# Patient Record
Sex: Female | Born: 1990 | Race: Black or African American | Hispanic: No | Smoking: Never smoker
Health system: Southern US, Community
[De-identification: ages and names within clinical notes are randomized; demographics above are authoritative.]

## PROBLEM LIST (undated history)

## (undated) ENCOUNTER — Inpatient Hospital Stay: Payer: Self-pay

## (undated) DIAGNOSIS — A6000 Herpesviral infection of urogenital system, unspecified: Secondary | ICD-10-CM

## (undated) DIAGNOSIS — K219 Gastro-esophageal reflux disease without esophagitis: Secondary | ICD-10-CM

## (undated) DIAGNOSIS — E282 Polycystic ovarian syndrome: Secondary | ICD-10-CM

## (undated) DIAGNOSIS — F419 Anxiety disorder, unspecified: Secondary | ICD-10-CM

## (undated) DIAGNOSIS — E669 Obesity, unspecified: Secondary | ICD-10-CM

## (undated) DIAGNOSIS — Z6790 Unspecified blood type, Rh positive: Secondary | ICD-10-CM

## (undated) DIAGNOSIS — O3430 Maternal care for cervical incompetence, unspecified trimester: Secondary | ICD-10-CM

## (undated) HISTORY — DX: Polycystic ovarian syndrome: E28.2

## (undated) HISTORY — DX: Unspecified blood type, Rh positive: Z67.90

## (undated) HISTORY — DX: Obesity, unspecified: E66.9

## (undated) HISTORY — DX: Herpesviral infection of urogenital system, unspecified: A60.00

---
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
## 2012-12-13 LAB — HM PAP SMEAR: HM Pap smear: NEGATIVE

## 2013-10-04 ENCOUNTER — Emergency Department: Payer: Self-pay | Admitting: Emergency Medicine

## 2013-10-04 LAB — CBC
HCT: 34.7 % — ABNORMAL LOW (ref 35.0–47.0)
HGB: 11.9 g/dL — ABNORMAL LOW (ref 12.0–16.0)
MCH: 29.2 pg (ref 26.0–34.0)
MCHC: 34.3 g/dL (ref 32.0–36.0)
MCV: 85 fL (ref 80–100)
Platelet: 285 10*3/uL (ref 150–440)
RBC: 4.08 10*6/uL (ref 3.80–5.20)
RDW: 14.8 % — ABNORMAL HIGH (ref 11.5–14.5)
WBC: 13.5 10*3/uL — ABNORMAL HIGH (ref 3.6–11.0)

## 2013-10-04 LAB — HCG, QUANTITATIVE, PREGNANCY: BETA HCG, QUANT.: 11175 m[IU]/mL — AB

## 2013-10-09 ENCOUNTER — Emergency Department: Payer: Self-pay | Admitting: Emergency Medicine

## 2013-10-09 DIAGNOSIS — O3430 Maternal care for cervical incompetence, unspecified trimester: Secondary | ICD-10-CM

## 2013-10-09 LAB — CBC
HCT: 36 %
HGB: 11.9 g/dL — ABNORMAL LOW
MCH: 28.3 pg
MCHC: 33.1 g/dL
MCV: 85 fL
Platelet: 311 10*3/uL
RBC: 4.22 X10 6/mm 3
RDW: 14.7 % — ABNORMAL HIGH
WBC: 15.6 10*3/uL — ABNORMAL HIGH

## 2013-10-09 LAB — HCG, QUANTITATIVE, PREGNANCY: BETA HCG, QUANT.: 9105 m[IU]/mL — AB

## 2013-10-12 LAB — PATHOLOGY REPORT

## 2013-10-16 ENCOUNTER — Emergency Department: Payer: Self-pay | Admitting: Internal Medicine

## 2013-10-16 LAB — COMPREHENSIVE METABOLIC PANEL
ALK PHOS: 84 U/L
ALT: 46 U/L
AST: 18 U/L (ref 15–37)
Albumin: 3.6 g/dL (ref 3.4–5.0)
Anion Gap: 7 (ref 7–16)
BUN: 10 mg/dL (ref 7–18)
Bilirubin,Total: 0.2 mg/dL (ref 0.2–1.0)
CREATININE: 0.7 mg/dL (ref 0.60–1.30)
Calcium, Total: 9.6 mg/dL (ref 8.5–10.1)
Chloride: 105 mmol/L (ref 98–107)
Co2: 26 mmol/L (ref 21–32)
EGFR (Non-African Amer.): >60
GLUCOSE: 98 mg/dL (ref 65–99)
Osmolality: 275 (ref 275–301)
Potassium: 3.8 mmol/L (ref 3.5–5.1)
Sodium: 138 mmol/L (ref 136–145)
Total Protein: 7.6 g/dL (ref 6.4–8.2)

## 2013-10-16 LAB — CBC WITH DIFFERENTIAL/PLATELET
Basophil #: 0 10*3/uL (ref 0.0–0.1)
Basophil %: 0.4 %
Eosinophil #: 0.1 10*3/uL (ref 0.0–0.7)
Eosinophil %: 1.2 %
HCT: 37.2 % (ref 35.0–47.0)
HGB: 12.4 g/dL (ref 12.0–16.0)
Lymphocyte #: 2.4 10*3/uL (ref 1.0–3.6)
Lymphocyte %: 22.2 %
MCH: 28.7 pg (ref 26.0–34.0)
MCHC: 33.2 g/dL (ref 32.0–36.0)
MCV: 87 fL (ref 80–100)
MONO ABS: 0.6 x10 3/mm (ref 0.2–0.9)
Monocyte %: 5.2 %
NEUTROS ABS: 7.6 10*3/uL — AB (ref 1.4–6.5)
NEUTROS PCT: 71 %
PLATELETS: 424 10*3/uL (ref 150–440)
RBC: 4.31 10*6/uL (ref 3.80–5.20)
RDW: 14.9 % — ABNORMAL HIGH (ref 11.5–14.5)
WBC: 10.7 10*3/uL (ref 3.6–11.0)

## 2013-10-16 LAB — D-DIMER(ARMC): D-Dimer: 516 ng/ml

## 2013-10-26 ENCOUNTER — Encounter: Payer: Self-pay | Admitting: Internal Medicine

## 2013-10-26 ENCOUNTER — Ambulatory Visit (INDEPENDENT_AMBULATORY_CARE_PROVIDER_SITE_OTHER): Payer: Managed Care, Other (non HMO) | Admitting: Internal Medicine

## 2013-10-26 VITALS — BP 116/72 | HR 81 | Temp 98.4°F | Wt 222.0 lb

## 2013-10-26 DIAGNOSIS — R0781 Pleurodynia: Secondary | ICD-10-CM

## 2013-10-26 DIAGNOSIS — R079 Chest pain, unspecified: Secondary | ICD-10-CM

## 2013-10-26 MED ORDER — NAPROXEN 500 MG PO TABS: 500.0000 mg | ORAL_TABLET | Freq: Three times a day (TID) | ORAL | Status: DC

## 2013-10-26 NOTE — Progress Notes (Signed)
Pre visit review using our clinic review tool, if applicable. No additional management support is needed unless otherwise documented below in the visit note. 

## 2013-10-26 NOTE — Progress Notes (Signed)
   Subjective:    Patient ID: Regina Chambers, female    DOB: 05/03/1990, 23 y.o.   MRN: [id number]  HPI  Pt presents to the clinic today with c/o left upper quadrant pain. She reports this started 2 weeks ago. She describes the pain as achy, but can be sharp especially with movement. It also hurts with laughing or coughing. She denies nausea, vomiting or diarrhea. She is having normal BM's daily. She reports that she did have a miscarriage 3 weeks ago at [redacted] weeks along. She reports that she did have this pain prior to her pregnancy for a month or two. She denies any particular injury to the area.   Review of Systems      No past medical history on file.  No current outpatient prescriptions on file.   No current facility-administered medications for this visit.    Allergies not on file  No family history on file.  History   Social History  . Marital Status: Single    Spouse Name: N/A    Number of Children: N/A  . Years of Education: N/A   Occupational History  . Not on file.   Social History Main Topics  . Smoking status: Not on file  . Smokeless tobacco: Not on file  . Alcohol Use: Not on file  . Drug Use: Not on file  . Sexual Activity: Not on file   Other Topics Concern  . Not on file   Social History Narrative  . No narrative on file     Constitutional: Denies fever, malaise, fatigue, headache or abrupt weight changes.   Gastrointestinal: Pt reports left side pain. Denies abdominal pain, bloating, constipation, diarrhea or blood in the stool.  GU: Denies urgency, frequency, pain with urination, burning sensation, blood in urine, odor or discharge.   No other specific complaints in a complete review of systems (except as listed in HPI above).  Objective:   Physical Exam  BP 116/72  Pulse 81  Temp(Src) 98.4 F (36.9 C) (Oral)  Wt 222 lb (100.699 kg)  SpO2 99%  LMP 06/04/2013 Wt Readings from Last 3 Encounters:  10/26/13 222 lb (100.699 kg)     General: Appears her stated age, obese but well developed, well nourished in NAD. Cardiovascular: Normal rate and rhythm. S1,S2 noted.  No murmur, rubs or gallops noted.  Pulmonary/Chest: Normal effort and positive vesicular breath sounds. No respiratory distress. No wheezes, rales or ronchi noted.  Abdomen: Soft and nontender. Normal bowel sounds, no bruits noted. No distention or masses noted. Liver, spleen and kidneys non palpable. MSK: Pain with palpation of the left lower anterior ribs.      Assessment & Plan:  Pain in left rib cage:  Try some naproxen 500 mg TID prn Ice may help as well  If no improvement, we can discuss further at your new patient appointment.

## 2013-11-05 ENCOUNTER — Encounter: Payer: Self-pay | Admitting: Internal Medicine

## 2013-11-05 ENCOUNTER — Telehealth: Payer: Self-pay | Admitting: Internal Medicine

## 2013-11-05 ENCOUNTER — Ambulatory Visit (INDEPENDENT_AMBULATORY_CARE_PROVIDER_SITE_OTHER): Payer: Managed Care, Other (non HMO) | Admitting: Internal Medicine

## 2013-11-05 VITALS — BP 110/70 | HR 85 | Temp 99.0°F | Wt 225.8 lb

## 2013-11-05 DIAGNOSIS — R071 Chest pain on breathing: Secondary | ICD-10-CM

## 2013-11-05 DIAGNOSIS — R197 Diarrhea, unspecified: Secondary | ICD-10-CM

## 2013-11-05 DIAGNOSIS — R0789 Other chest pain: Secondary | ICD-10-CM | POA: Insufficient documentation

## 2013-11-05 NOTE — Telephone Encounter (Signed)
Patient Information:  Caller Name: Kloe  Phone: [phone]  Patient: Regina Chambers, Regina Chambers  Gender: Female  DOB: 15-Sep-1990  Age: 23 Years  PCP: Other  Pregnant: No  Office Follow Up:  Does the office need to follow up with this patient?: No  Instructions For The Office: N/A  RN Note:  Spoke to nurse Tasha at the office and she consulted with Nicki Reaper NP who said that it was ok for pt to be seen at the office this afternoon. Appt scheduled for today(11/05/13) @ 1630(per pt request) with Dr.Letvak.  Symptoms  Reason For Call & Symptoms: Calling about constant left upper abd pain--annoying,nagging pain that hurts worse when laughing or coughing and when moving, diarrhea with mucous, occas nausea. Is at work now and has been performing daily activities. Just had a miscarriage 4 weeks ago. Was seen at office on 10/26/13 for abd pain but sxs have gotten worse. Pt is concerned about possible pancreatitis and wanst an US done. Taking Ibuprofen around the clock with effect for pain. Has had pain for months but now is constant and has gotten much worse and she is very concerned.  Reviewed Health History In EMR: Yes  Reviewed Medications In EMR: Yes  Reviewed Allergies In EMR: Yes  Reviewed Surgeries / Procedures: Yes  Date of Onset of Symptoms: 11/03/2013  Treatments Tried: Ibuprofen  Treatments Tried Worked: Yes OB / GYN:  LMP: 06/04/2013  Guideline(s) Used:  Abdominal Pain - Female  Disposition Per Guideline:   Go to ED Now (or to Office with PCP Approval)  Reason For Disposition Reached:   Constant abdominal pain lasting > 2 hours  Advice Given:  Call Back If:  You become worse.  Patient Will Follow Care Advice:  YES  Appointment Scheduled:  11/05/2013 16:30:00 Appointment Scheduled Provider:  Tillman [facility]ide [facility])

## 2013-11-05 NOTE — Assessment & Plan Note (Signed)
Seems to be in ribs Discussed trying sports bra without underwire for now She is very anxious about this Will check labs Had chest CT at [facility]---will get that result and review also

## 2013-11-05 NOTE — Progress Notes (Signed)
Pre visit review using our clinic review tool, if applicable. No additional management support is needed unless otherwise documented below in the visit note. 

## 2013-11-05 NOTE — Telephone Encounter (Signed)
Pt seen in the office this afternoon 

## 2013-11-05 NOTE — Assessment & Plan Note (Signed)
Doesn't seem to be an illness Discussed that she is describing what could just be a metformin reaction No reason to do any further testing

## 2013-11-05 NOTE — Progress Notes (Signed)
   Subjective:    Patient ID: Regina Chambers, female    DOB: [birth year], 23 y.o.   MRN: [id number]  HPI Having a lot of abdominal pain Recent miscarriage at 17 weeks (about 3 weeks ago)--not sure if it could be related to this Went into preterm labor and delivered the fetus--- placenta also came out okay Has had some vaginal bleeding---lightened up but then a little more after sex  Pain under left ribs--radiates to back Fairly constant Uses ibuprofen ---this helps. But then it returns Nagging, gnawing feeling at times---other times it gets worse with bending or trying to lift things This goes back to April Wondered if it could be from underwire in bra Has adjusted her position at work Did seem to improve during pregnancy  Works in [employer] lab Has diarrhea, mucus in stool and it is floating in bowl May have gone back for a while Loose stool about twice a day No blood in stool Has seen yellow mass separate from her stools Gets some urgency-- no incontinence though  Current Outpatient Prescriptions on File Prior to Visit  Medication Sig Dispense Refill  . folic acid (FOLVITE) 400 MCG tablet Take 400 mcg by mouth daily.      . metFORMIN (GLUCOPHAGE-XR) 750 MG 24 hr tablet Take 750 mg by mouth 2 (two) times daily.      . Multiple Vitamin (MULTIVITAMIN) tablet Take 1 tablet by mouth daily.      . naproxen (NAPROSYN) 500 MG tablet Take 1 tablet (500 mg total) by mouth 3 (three) times daily with meals.  30 tablet  0   No current facility-administered medications on file prior to visit.    No Known Allergies  Past Medical History  Diagnosis Date  . Polycystic disease, ovaries     No past surgical history on file.  No family history on file.  History   Social History  . Marital Status: Single    Spouse Name: N/A    Number of Children: N/A  . Years of Education: N/A   Occupational History  . Not on file.   Social History Main Topics  . Smoking status: Never Smoker    . Smokeless tobacco: Never Used  . Alcohol Use: No  . Drug Use: Not on file  . Sexual Activity: Not on file   Other Topics Concern  . Not on file   Social History Narrative  . No narrative on file   Review of Systems No endometriosis --only PCOS (the reason for the metformin) No injuries Appetite okay--has tried to cut back on food Some cough--vague No SOB No rash    Objective:   Physical Exam  Constitutional: She appears well-developed and well-nourished. No distress.  Neck: Normal range of motion. Neck supple. No thyromegaly present.  Cardiovascular: Normal rate, regular rhythm and normal heart sounds.  Exam reveals no gallop and no friction rub.   No murmur heard. Pulmonary/Chest: Effort normal and breath sounds normal. No respiratory distress. She has no wheezes. She has no rales.  Tender along ribs T9-12 along anterior axillary line on left) No rib abnormalities No dullness in lungs  Abdominal: Soft. She exhibits no distension. There is no tenderness. There is no rebound and no guarding.  Lymphadenopathy:    She has no cervical adenopathy.  Psychiatric:  Anxious about the pain in left side          Assessment & Plan:

## 2013-11-06 LAB — COMPREHENSIVE METABOLIC PANEL
ALBUMIN: 4.1 g/dL (ref 3.5–5.2)
ALT: 23 U/L (ref 0–35)
AST: 22 U/L (ref 0–37)
Alkaline Phosphatase: 61 U/L (ref 39–117)
BUN: 8 mg/dL (ref 6–23)
CO2: 25 meq/L (ref 19–32)
Calcium: 9.3 mg/dL (ref 8.4–10.5)
Chloride: 103 mEq/L (ref 96–112)
Creatinine, Ser: 0.6 mg/dL (ref 0.4–1.2)
GFR: 147.63 mL/min (ref >60.00)
Glucose, Bld: 84 mg/dL (ref 70–99)
POTASSIUM: 3.8 meq/L (ref 3.5–5.1)
SODIUM: 137 meq/L (ref 135–145)
TOTAL PROTEIN: 7.5 g/dL (ref 6.0–8.3)
Total Bilirubin: 0.5 mg/dL (ref 0.2–1.2)

## 2013-11-06 LAB — CBC WITH DIFFERENTIAL/PLATELET
BASOS PCT: 0.3 % (ref 0.0–3.0)
Basophils Absolute: 0 10*3/uL (ref 0.0–0.1)
EOS PCT: 0.8 % (ref 0.0–5.0)
Eosinophils Absolute: 0.1 10*3/uL (ref 0.0–0.7)
HCT: 37.2 % (ref 36.0–46.0)
Hemoglobin: 12.6 g/dL (ref 12.0–15.0)
Lymphocytes Relative: 24.1 % (ref 12.0–46.0)
Lymphs Abs: 2 10*3/uL (ref 0.7–4.0)
MCHC: 34 g/dL (ref 30.0–36.0)
MCV: 83.9 fl (ref 78.0–100.0)
MONOS PCT: 4.4 % (ref 3.0–12.0)
Monocytes Absolute: 0.4 10*3/uL (ref 0.1–1.0)
NEUTROS PCT: 70.4 % (ref 43.0–77.0)
Neutro Abs: 6 10*3/uL (ref 1.4–7.7)
PLATELETS: 330 10*3/uL (ref 150.0–400.0)
RBC: 4.44 Mil/uL (ref 3.87–5.11)
RDW: 14.2 % (ref 11.5–15.5)
WBC: 8.5 10*3/uL (ref 4.0–10.5)

## 2013-11-06 LAB — LIPASE: LIPASE: 25 U/L (ref 11.0–59.0)

## 2013-11-06 LAB — T4, FREE: Free T4: 0.98 ng/dL (ref 0.60–1.60)

## 2013-11-08 ENCOUNTER — Ambulatory Visit: Payer: Managed Care, Other (non HMO) | Admitting: Internal Medicine

## 2013-11-21 ENCOUNTER — Ambulatory Visit: Payer: Self-pay | Admitting: Internal Medicine

## 2013-12-02 ENCOUNTER — Emergency Department: Payer: Self-pay | Admitting: Emergency Medicine

## 2013-12-02 LAB — CBC
HCT: 38.6 % (ref 35.0–47.0)
HGB: 12.3 g/dL (ref 12.0–16.0)
MCH: 27.2 pg (ref 26.0–34.0)
MCHC: 31.9 g/dL — AB (ref 32.0–36.0)
MCV: 85 fL (ref 80–100)
PLATELETS: 326 10*3/uL (ref 150–440)
RBC: 4.52 10*6/uL (ref 3.80–5.20)
RDW: 13.7 % (ref 11.5–14.5)
WBC: 9.9 10*3/uL (ref 3.6–11.0)

## 2013-12-02 LAB — URINALYSIS, COMPLETE
Bilirubin,UR: NEGATIVE
GLUCOSE, UR: NEGATIVE mg/dL (ref 0–75)
Ketone: NEGATIVE
Leukocyte Esterase: NEGATIVE
NITRITE: NEGATIVE
PROTEIN: NEGATIVE
Ph: 6 (ref 4.5–8.0)
RBC,UR: <1 /HPF (ref 0–5)
Specific Gravity: 1.003 (ref 1.003–1.030)
WBC UR: 1 /HPF (ref 0–5)

## 2013-12-02 LAB — COMPREHENSIVE METABOLIC PANEL
ALT: 53 U/L
AST: 28 U/L (ref 15–37)
Albumin: 3.8 g/dL (ref 3.4–5.0)
Alkaline Phosphatase: 67 U/L
Anion Gap: 9 (ref 7–16)
BILIRUBIN TOTAL: 0.3 mg/dL (ref 0.2–1.0)
BUN: 12 mg/dL (ref 7–18)
CALCIUM: 8.6 mg/dL (ref 8.5–10.1)
CREATININE: 0.77 mg/dL (ref 0.60–1.30)
Chloride: 106 mmol/L (ref 98–107)
Co2: 26 mmol/L (ref 21–32)
EGFR (African American): >60
Glucose: 105 mg/dL — ABNORMAL HIGH (ref 65–99)
OSMOLALITY: 281 (ref 275–301)
POTASSIUM: 3.4 mmol/L — AB (ref 3.5–5.1)
SODIUM: 141 mmol/L (ref 136–145)
Total Protein: 7.3 g/dL (ref 6.4–8.2)

## 2013-12-02 LAB — TROPONIN I: Troponin-I: <0.02 ng/mL

## 2013-12-27 ENCOUNTER — Ambulatory Visit: Payer: Self-pay | Admitting: Family Medicine

## 2014-01-01 ENCOUNTER — Ambulatory Visit: Payer: Managed Care, Other (non HMO) | Admitting: Internal Medicine

## 2014-01-22 LAB — OB RESULTS CONSOLE GC/CHLAMYDIA: CHLAMYDIA, DNA PROBE: NEGATIVE

## 2014-02-08 HISTORY — PX: CERVICAL CERCLAGE: SHX1329

## 2014-02-14 LAB — OB RESULTS CONSOLE VARICELLA ZOSTER ANTIBODY, IGG: VARICELLA IGG: IMMUNE

## 2014-02-14 LAB — OB RESULTS CONSOLE HEPATITIS B SURFACE ANTIGEN: Hepatitis B Surface Ag: NEGATIVE

## 2014-02-14 LAB — OB RESULTS CONSOLE ABO/RH: RH TYPE: POSITIVE

## 2014-02-14 LAB — OB RESULTS CONSOLE GC/CHLAMYDIA: Gonorrhea: NEGATIVE

## 2014-02-14 LAB — OB RESULTS CONSOLE RUBELLA ANTIBODY, IGM: Rubella: NON-IMMUNE/NOT IMMUNE

## 2014-03-29 ENCOUNTER — Ambulatory Visit: Payer: Self-pay | Admitting: Obstetrics and Gynecology

## 2014-04-01 ENCOUNTER — Encounter: Payer: Self-pay | Admitting: Maternal & Fetal Medicine

## 2014-04-18 ENCOUNTER — Encounter: Payer: Self-pay | Admitting: Obstetrics & Gynecology

## 2014-05-02 ENCOUNTER — Encounter: Payer: Self-pay | Admitting: Maternal & Fetal Medicine

## 2014-05-13 ENCOUNTER — Encounter
Admit: 2014-05-13 | Disposition: A | Discharge status: Home / Self Care | Payer: Self-pay | Attending: Obstetrics and Gynecology | Admitting: Obstetrics and Gynecology

## 2014-05-30 ENCOUNTER — Encounter
Admit: 2014-05-30 | Disposition: A | Discharge status: Home / Self Care | Payer: Self-pay | Attending: Maternal & Fetal Medicine | Admitting: Maternal & Fetal Medicine

## 2014-06-09 NOTE — Op Note (Signed)
PATIENT NAME:  Regina Chambers, [name] MR#:  [id number] DATE OF BIRTH:  07-Dec-1990  DATE OF PROCEDURE:  03/29/2014  PREOPERATIVE DIAGNOSES:  851.  A 24 year old gravida 2, para 0-1-0-0 at 16 weeks', 4 days' gestation.  2.  History of cervical insufficiency.   3.  Shortened cervix at 27-29 mm of functional length on recent ultrasound.   POSTOPERATIVE DIAGNOSES: 851.  A 24 year old gravida 2, para 0-1-0-0 at 16 weeks', 4 days' gestation.  2.  History of cervical insufficiency.   3.  Shortened cervix at 27-29 mm of functional length on recent ultrasound.  OPERATION PERFORMED: McDonald cerclage.   ANESTHESIA USED: Spinal.   PRIMARY SURGEON: Florina OuAndreas M. Bonney AidStaebler, MD    ESTIMATED BLOOD LOSS: 25 mL.  OPERATIVE FLUIDS: Crystalloid 1 L.   URINE OUTPUT: Clear urine 50 mL.   COMPLICATIONS: None.  FINDINGS ON EXAMINATION: Good cervical length was noted. The external cervical os appeared slightly dilated but the cervix was otherwise closed. The cervix was extremely anterior. A cerclage placed using a #2 Tycron suture as well as a button, which was placed prior to tying down the suture to facilitate removal.   SPECIMENS REMOVED: None.   PATIENT CONDITION FOLLOWING PROCEDURE: Stable.   PROCEDURE IN DETAIL: Risks, benefits, and alternatives of this procedure were discussed with the patient prior to proceeding to the operating room. The patient was taken to the operating room where she was placed under spinal anesthesia. She was positioned in the dorsal lithotomy position in [place] stirrups, prepped and draped in the usual sterile fashion. A timeout was performed. Attention was turned to the patient's pelvis. The bladder was straight catheterized with a red rubber catheter. An operative speculum was then placed to allow visualization of the cervix. The sidewalls were retracted using a weighted Deaver. The cervix was grasped with an Allis clamp. A #2 Tycron suture CT needle was used for the suture for  cerclage. The initial stitch was placed from 12 o'clock to approximately 9 o'clock as close to the cervicovaginal junction as possible. Then a second stitch from 9 o'clock to 6 o'clock was undertaken followed by a 6 o'clock to 3 o'clock and then 3 o'clock to 12 o'clock suture. The needle was then cut off the suture. The suture ends were passed through a sterile polypropylene button and the suture was then tied down snug but not too tight. Following placement of the knots, the suture ends were cut at approximately 3 cm in length. The cervix was inspected and noted to be hemostatic. There did not appear to be excessive tension any of the sutures placed. The speculum was removed. Sponge, needle, and instrument counts were correct x 2. The patient tolerated the procedure well and was taken to the recovery room in stable condition.    ____________________________ Florina OuAndreas M. Bonney AidStaebler, MD ams:bm D: 03/29/2014 19:57:14 ET T: 03/30/2014 08:00:23 ET JOB#: [id number]  cc: Florina OuAndreas M. Bonney AidStaebler, MD, <Dictator> Lorrene ReidANDREAS M Arda Daggs MD ELECTRONICALLY SIGNED 04/16/2014 21:02

## 2014-06-09 NOTE — Consult Note (Signed)
Referral Information:  Reason for Referral History of midtrimester loss, now pregnant   Referring Physician [facility]   Prenatal Hx Regina Chambers is a 24 year-old G2 P0010 at 14 0/7 weeks who presents for recommendations on management in the current pregnancy  Regina Chambers's first pregnancy was in 2015. She reports having cramping pain at 15 weeks. She was evaluated in the ED and had a normal ultrasound and examined.  Over the course of the next week, she noticed increased vaginal discharge, worsening cramping and finally spotting. The labor pains became closer together and she eventually delivered her fetus and placenta at home. She re-presented to the ED. Pathology showed "early acute chorioamnionitis."  During the current pregnancy, she has overall done well.  She denies pelvic pain or pressure, vaginal bleeding or change in discharge.  Ultrasound at [facility] on 03/27/14 at 16 2/7 weeks showed a cervical length of 2.7-2.9 cm. She had a prophylactic cerclage placed the following day. She reports no complications since having the cerclage placed.   Past Obstetrical Hx G1 P0010 2015: Spontaneous vaginal delivery at 16 weeks. Pt had labor symptoms, though unclear if in setting of cervical incompetance. Placental path showed chorioamnionitis   Home Medications: Medication Instructions Status  folic acid 0.8 mg oral tablet 1 tab(s) orally once a day Active  Prenatal 1 Plus 1   plus 800 mcg iron Active   Allergies:   No Known Allergies:   Vital Signs/Notes:  Nursing Vital Signs: **Vital Signs.:   22-Feb-16 11:11  Pulse Pulse 75  Systolic BP Systolic BP 124  Diastolic BP (mmHg) Diastolic BP (mmHg) 74   Perinatal Consult:  PGyn Hx Denies history of abnormal paps or STDs   Past Medical History cont'd Denies history of hypertension, diabetes or thyroid disorders Reports having a history of PCOS. Used metformin to get pregnant. No longer taking.   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 Deneis vaginal bleeding, contractions, change in vaginal discarge, urine symptoms or abdominal pain.   Fever/Chills No   Abdominal Pain No   Nausea/Vomiting No   SOB/DOE No   Chest Pain No   Tolerating Diet Yes   Medications/Allergies Reviewed Medications/Allergies reviewed    Additional Lab/Radiology Notes Prenatal labs (02/06/14): Blood type A positive, antibody screen negative, Rubella non-immune, RPR reactive, Hep B negative, HIV non-reactive, Varicella immune, hct 35.5, MCV 81, Plt 330, Hemoglobin Electrophoresis normal, Early glucola 116, Gc/Chl negative  Cell-free fetal DNA (03/05/14): Informaseq, no aneuploidy detected  APS labs (11/26/13): Lupus anticoagulant indeterminant, anticardiolipin antibody negative, anti-beta2 glycoprotein antibody negative, factor V Leiden negatiev, TSH 1.35, Protein S level 87%, Protein C level 187%   Impression/Recommendations:  Impression 24 year-old G2 P0010 at 98 0/7 weeks with history of mid-trimester loss at 16 weeks. For that loss, her presenting symptoms were concering for preterm labor but is unclear if cervical incompetance preceeded her symptoms.  In additon, she reports having an [place] the week prior to her delivery which showed a live IUP but is unknown if a fetal demise occured first leading to labor of if her misscarriage was independent of fetal demise. She had APS testing which showed the LAC to be interderminant.  In the current pregnancy, she has found to have a cervcial length of  2.7-2.9 cm and had a prophylactic cerclage placed at 16 weeks.   Recommendations 1. History of 16 week delivery with symptoms of preterm delivery.  Her presentation is suggestive of regular painful contractions. It is unknown if  she first had a fetal demise that then lead to labor or if labor occured independent of fetal demise.  Next, it is unknown if there is a component of cervical incompetence with that delivery that then lead to chorioamnionitis and labor.  A recent US in the current pregnancy demonstrated a cervcial length of 2.7-2.9 cm. Regina Chambers had a prophylactic cerclage placed.  The use of 17P in this setting is unclear. The inclusion criteria for 17P is a pregnancy loss due to spontaneous preterm labor or PPROM between 20 and 36 weeks. As her delivery occured prior to 20 weeks, we do not know if 17P would be beneficial.  Next, her cervical length in the current pregnancy was 2.7-2.9 cm and she had a cerclage placed. It is unknown if the addition of vaginal progesterone would be beneficial.  Recommned: -Currently does not meet criteria for use of 17P -Cervical length screening by ultrasound every 1-2 weeks until 26-28 weeks. If her cervix is found to funnel past the stitch, recommend modification of activity level and if occurs after 23 weeks, recommend giving antenatal corticosteroids -Pt had normal cell-free fetal DNA testing. Please offer ONTD screening if desired -Repeat lupus anticoagulant testing as her prior test was inderterminant.  If positive, please send back to discuss potential use of prophylactic dose anticoagulation in the pregnancy -[facility] if desired -Consider evaluation of uterine cavity when not pregnant -History of PCOS. Had normal early glucola. At risk for gestational diabetes. Please rescreen at 28 weeks -Rubella non-immune. Will need Rubella vacination postpartum -Daily baby aspirin    Total Time Spent with Patient 60 minutes   >50% of visit spent in couseling/coordination of care yes   Office Use Only 99244  Level 4 (60min) NEW office consult low complexity   Coding Description: OTHER: History of midtrimester  loss.  Electronic Signatures: Darcus Edds, [place] (MD)  (Signed 22-Feb-16 15:18)  Authored: Referral, Home Medications, Allergies, Vital Signs/Notes, Consult, Exam, Lab/Radiology Notes, Impression, Billing, Coding Description   Last Updated: 22-Feb-16 15:18 by Areeba Sulser, [place] (MD)

## 2014-06-14 LAB — OB RESULTS CONSOLE RPR: RPR: NONREACTIVE

## 2014-06-14 LAB — OB RESULTS CONSOLE HIV ANTIBODY (ROUTINE TESTING): HIV: NONREACTIVE

## 2014-06-21 ENCOUNTER — Observation Stay
Admission: EM | Admit: 2014-06-21 | Discharge: 2014-06-21 | Disposition: A | Discharge status: Home / Self Care | Payer: Managed Care, Other (non HMO) | Attending: Obstetrics and Gynecology | Admitting: Obstetrics and Gynecology

## 2014-06-21 DIAGNOSIS — Z3A28 28 weeks gestation of pregnancy: Secondary | ICD-10-CM | POA: Insufficient documentation

## 2014-06-21 DIAGNOSIS — O368120 Decreased fetal movements, second trimester, not applicable or unspecified: Secondary | ICD-10-CM | POA: Diagnosis not present

## 2014-06-21 DIAGNOSIS — O368121 Decreased fetal movements, second trimester, fetus 1: Secondary | ICD-10-CM

## 2014-06-21 DIAGNOSIS — O368190 Decreased fetal movements, unspecified trimester, not applicable or unspecified: Secondary | ICD-10-CM | POA: Diagnosis present

## 2014-06-21 HISTORY — DX: Maternal care for cervical incompetence, unspecified trimester: O34.30

## 2014-06-21 NOTE — OB Triage Provider Note (Signed)
OB History & Physical   History of Present Illness:  Chief Complaint: decreased fetal movement  HPI:  Regina Chambers is a 24 y.o. G1P0 female at 5539w3d dated by LMP c/w 6 wk u/s.  Her pregnancy has been complicated by a history of cervical insufficiency with a 16 week loss. She had a cerclage placed during this pregnancy at 16 weeks.  Her pregnancy is also complicated by obesity with an initial BMI of 42.  .  She presents to L&D for evaluation of decreased fetal movement.  The last time she felt the baby move was 1pm today.    She denies Contractions.   She denies Leakage of fluid.   She denies Vaginal Bleeding.       Maternal Medical History:   Past Medical History  Diagnosis Date  . Polycystic disease, ovaries   . Cervical insufficiency in pregnancy, antepartum     prior pregnancy   Surgical History: History reviewed. No pertinent past surgical history.  No Known Allergies  Prior to Admission medications   Medication Sig Start Date End Date Taking? Authorizing Provider  folic acid (FOLVITE) 400 MCG tablet Take 400 mcg by mouth daily.    Historical Provider, MD  metFORMIN (GLUCOPHAGE-XR) 750 MG 24 hr tablet Take 750 mg by mouth 2 (two) times daily.    Historical Provider, MD  Multiple Vitamin (MULTIVITAMIN) tablet Take 1 tablet by mouth daily.    Historical Provider, MD     OB History  Gravida Para Term Preterm AB SAB TAB Ectopic Multiple Living  2 0 0 0 1 1 0 0 0 0     # Outcome Date GA Lbr Len/2nd Weight Sex Delivery Anes PTL Lv  2 Current           1 SAB      SAB   FD     Complications: Cervical insufficiency in pregnancy, antepartum      Prenatal care site: [facility]  Social History: She  reports that she has never smoked. She has never used smokeless tobacco. She reports that she does not drink alcohol or use illicit drugs.  Family History: family history is not on file.   Review of Systems: Negative x 10 systems reviewed except as noted in the HPI.      Physical Exam:  Vital Signs: LMP 12/04/2013, afebrile, vitals normal General: no acute distress.  HEENT: normocephalic, atraumatic Heart: regular rate & rhythm.  No murmurs/rubs/gallops Lungs: clear to auscultation bilaterally Abdomen: soft, gravid, non-tender Extremities: non-tender, symmetric, no edema bilaterally.  DTRs:   Neurologic: Alert & oriented x 3.    Pertinent Results:  Prenatal Labs: Blood type/Rh A positive  Antibody screen negative  Rubella Non immune  RPR Non reactive  HBsAg neg  HIV neg  GC neg  Chlamydia neg  Genetic screening NIFT negative  GBS unknown   Baseline FHR: 135 beats    Variability: moderate    Accelerations: present 10x10    Decelerations: absent Contractions: absent  frequency: n/a Overall assessment: reassuring for gestational age   Assessment:  Regina Chambers is a 24 y.o. G1P0 female at 3239w3d with decreased fetal movement.   Plan:  1. FWB: reassuring overall given gestational age. Patient provided reassurance.  2. Follow up this week already schedule 3. Discharge home   Conard NovakStephen D. Jackson, MD, [facility] 06/21/2014 10:49 PM

## 2014-06-21 NOTE — OB Triage Note (Signed)
Pt to L&D with c/o decreased FM since 1400 today.  Denies VB or ROM

## 2014-07-11 ENCOUNTER — Observation Stay
Admission: EM | Admit: 2014-07-11 | Discharge: 2014-07-11 | Disposition: A | Discharge status: Home / Self Care | Payer: Managed Care, Other (non HMO) | Attending: Obstetrics and Gynecology | Admitting: Obstetrics and Gynecology

## 2014-07-11 DIAGNOSIS — O368130 Decreased fetal movements, third trimester, not applicable or unspecified: Secondary | ICD-10-CM | POA: Diagnosis present

## 2014-07-11 DIAGNOSIS — O368190 Decreased fetal movements, unspecified trimester, not applicable or unspecified: Secondary | ICD-10-CM | POA: Diagnosis present

## 2014-07-11 DIAGNOSIS — Z3A31 31 weeks gestation of pregnancy: Secondary | ICD-10-CM | POA: Diagnosis not present

## 2014-07-11 DIAGNOSIS — O368121 Decreased fetal movements, second trimester, fetus 1: Secondary | ICD-10-CM

## 2014-07-11 MED ORDER — ACETAMINOPHEN 325 MG PO TABS: 650.0000 mg | ORAL_TABLET | ORAL | Status: DC | PRN

## 2014-07-11 MED ORDER — PRENATAL MULTIVITAMIN CH: 1.0000 | ORAL_TABLET | Freq: Every day | ORAL | Status: DC

## 2014-07-11 MED ORDER — DOCUSATE SODIUM 100 MG PO CAPS: 100.0000 mg | ORAL_CAPSULE | Freq: Every day | ORAL | Status: DC

## 2014-07-11 MED ORDER — CALCIUM CARBONATE ANTACID 500 MG PO CHEW: 2.0000 | CHEWABLE_TABLET | ORAL | Status: DC | PRN

## 2014-07-11 MED ORDER — ZOLPIDEM TARTRATE 5 MG PO TABS: 5.0000 mg | ORAL_TABLET | Freq: Every evening | ORAL | Status: DC | PRN

## 2014-07-11 NOTE — OB Triage Note (Signed)
Pt here in l/d with c/o decreased movement. efm applied

## 2014-07-25 ENCOUNTER — Ambulatory Visit
Admission: RE | Admit: 2014-07-25 | Discharge: 2014-07-25 | Disposition: A | Discharge status: Home / Self Care | Payer: Managed Care, Other (non HMO) | Source: Ambulatory Visit | Attending: Maternal & Fetal Medicine | Admitting: Maternal & Fetal Medicine

## 2014-07-25 DIAGNOSIS — O3430 Maternal care for cervical incompetence, unspecified trimester: Secondary | ICD-10-CM | POA: Insufficient documentation

## 2014-07-25 DIAGNOSIS — Z6841 Body Mass Index (BMI) 40.0 and over, adult: Secondary | ICD-10-CM | POA: Insufficient documentation

## 2014-07-25 DIAGNOSIS — O403XX0 Polyhydramnios, third trimester, not applicable or unspecified: Secondary | ICD-10-CM | POA: Diagnosis not present

## 2014-07-25 DIAGNOSIS — Z36 Encounter for antenatal screening of mother: Secondary | ICD-10-CM | POA: Diagnosis present

## 2014-07-25 DIAGNOSIS — Z3A33 33 weeks gestation of pregnancy: Secondary | ICD-10-CM

## 2014-07-25 DIAGNOSIS — E282 Polycystic ovarian syndrome: Secondary | ICD-10-CM | POA: Diagnosis not present

## 2014-07-25 DIAGNOSIS — O99210 Obesity complicating pregnancy, unspecified trimester: Secondary | ICD-10-CM

## 2014-07-25 DIAGNOSIS — E669 Obesity, unspecified: Secondary | ICD-10-CM

## 2014-07-25 DIAGNOSIS — O409XX0 Polyhydramnios, unspecified trimester, not applicable or unspecified: Secondary | ICD-10-CM | POA: Insufficient documentation

## 2014-07-25 LAB — US OB FOLLOW UP

## 2014-07-25 NOTE — Progress Notes (Addendum)
[facility] Consultation   Chief Complaint: Polyhydramnios  HPI: Ms. Regina Chambers is a 24 y.o. G2P0010 at [redacted]w[redacted]d by LMP concordant with ultrasound performed at [facility] on 01/22/2015 at 6 weeks 6 days gestation.  who presents in consultation from  for only hydramnios.  Regina Chambers was originally referred to [facility] for consultation regarding second trimester loss. She had a prophylactic cerclage placed by Consuella Lose due to history of second trimester loss and concerns for short cervix.  She was seen by [facility] previously for detailed fetal anatomy assessment and serial cervical lengths.  Fetal anatomy has been visualized previously and was normal. Serial cervical length measurements were normal.  The patient has been referred again due to polyhdramnios detected at the time of a growth scan at [facility] performed 07/09/2014 at 31 weeks 3 days gestation.  EFW was at the 48th percentile.  AFI was 29.2 cm.  The patient had normal glucose screening on 06/13/2014.  Obstetric History:  Obstetric History   G2   P0   T0   P0   A1   TAB0   SAB1   E0   M0   L0     # Outcome Date GA Lbr Len/2nd Weight Sex Delivery Anes PTL Lv  2 Current           1 SAB 10/09/13 [redacted]w[redacted]d    SAB   FD     Complications: Cervical insufficiency in pregnancy, antepartum      Gynecologic History:  Patient's last menstrual period was 12/03/2013.  History of PCOS  Past Medical History: Patient  has a past medical history of Polycystic disease, ovaries and Cervical insufficiency in pregnancy, antepartum.   Past Surgical History: She  has past surgical history that includes Cervical cerclage (2016).   Medications:  Current Outpatient Prescriptions on File Prior to Encounter  Medication Sig Dispense Refill  . folic acid (FOLVITE) 400 MCG tablet Take 400 mcg by mouth daily.    . Multiple Vitamin (MULTIVITAMIN) tablet Take 1 tablet by mouth daily.      Allergies: Patient has No Known  Allergies.   Social History: Patient  reports that she has never smoked. She has never used smokeless tobacco. She reports that she does not drink alcohol or use illicit drugs.  The patient works as a Psychologist, counselling at [employer]. Her husband works in Clinical research associate  Family History: There is no family history of birth defects, mental retardation, or metabolic disorders. A maternal aunt has diabetes.  Review of Systems A full 12 point review of systems was negative or as noted in the History of Present Illness.  Objective:   Temp (F)   98.2  98.2 (36.8)  06/16 0809   Pulse Rate   101   101  06/16 0809   BP   130/75  130/75  06/16 0809   SpO2 (%)   98  98  06/16 0809   Weight (lb)   232       Ht 5'2"  BMI = 42  On ultrasound today, the fetal presentation is variable. The estimated fetal weight is 2243 g which is at the 48th percentile. The fetal anatomy appears normal or was visualized previously.  Of note, the stomach and bladder are visualized.  AFI = 36.1 cm.  MVP = 9.9 cm.   Asessement: 24 year old gravida 2 para 0010 at 33 weeks 3 days gestation with: 1. Polyhydramnios 2. History of incompetent cervix with cerclage in situ 3.  Obesity with BMI greater than 40  Recommendations:  Polyhydramnios  The patient was counseled on the possible etiologies of polyhydramnios including maternal hyperglycemia, type C esophageal atresia (where the stomach is visualized), and fetal neuromuscular disorders.  More than half of cases of polyhydramnios are unexplained.   The patient was counseled about the risk of preterm labor and prolapse of the umbilical cord with SROM.  She was advised to report to the hospital with signs of labor, leaking of water or gush of water.     I have recommended weekly BPPs which can be done at [facility].    If she remains undelivered at [redacted] weeks gestation, I would recommend delivery at that time.    Because of the possibility of unstable lie, she may need to be  scheduled for cesarean delivery with option for induction if the vertex becomes fixed in the pelvis, or her membranes can be ruptured under controlled circumstances (double-set up) in the OR.  History of incompetent cervix with cerclage in situ  The patient anticipates that her prophylactic cerclage will be removed on 08/07/2014.    Obesity with BMI greater than 40  Fetal surveillance as described above.    Total time spent with the patient was 30 minutes with greater than 50% spent in counseling and coordination of care.  We appreciate this interesting consult and will be happy to be involved in the ongoing care of Regina Chambers in anyway her obstetricians desire.  Argentina Ponder, MD [facility]

## 2014-07-25 NOTE — Addendum Note (Signed)
Encounter addended by: Lady Deutscher, MD on: 07/25/2014 10:41 AM<BR>     Documentation filed: Notes Section

## 2014-08-15 ENCOUNTER — Ambulatory Visit
Admission: RE | Admit: 2014-08-15 | Discharge: 2014-08-15 | Disposition: A | Discharge status: Home / Self Care | Payer: Managed Care, Other (non HMO) | Source: Ambulatory Visit | Attending: Obstetrics and Gynecology | Admitting: Obstetrics and Gynecology

## 2014-08-15 ENCOUNTER — Other Ambulatory Visit: Payer: Managed Care, Other (non HMO)

## 2014-08-15 DIAGNOSIS — O403XX0 Polyhydramnios, third trimester, not applicable or unspecified: Secondary | ICD-10-CM

## 2014-08-15 DIAGNOSIS — O409XX0 Polyhydramnios, unspecified trimester, not applicable or unspecified: Secondary | ICD-10-CM

## 2014-08-15 LAB — US OB FOLLOW UP

## 2014-08-23 ENCOUNTER — Observation Stay
Admission: EM | Admit: 2014-08-23 | Discharge: 2014-08-23 | Disposition: A | Discharge status: Home / Self Care | Payer: Managed Care, Other (non HMO) | Attending: Obstetrics and Gynecology | Admitting: Obstetrics and Gynecology

## 2014-08-23 DIAGNOSIS — Z3483 Encounter for supervision of other normal pregnancy, third trimester: Principal | ICD-10-CM | POA: Insufficient documentation

## 2014-08-23 DIAGNOSIS — Z3A37 37 weeks gestation of pregnancy: Secondary | ICD-10-CM | POA: Diagnosis not present

## 2014-08-23 LAB — OB RESULTS CONSOLE GC/CHLAMYDIA
Chlamydia: NEGATIVE
GC PROBE AMP, GENITAL: NEGATIVE

## 2014-08-23 LAB — OB RESULTS CONSOLE GBS: STREP GROUP B AG: NEGATIVE

## 2014-08-23 NOTE — Discharge Instructions (Signed)

## 2014-08-23 NOTE — Discharge Summary (Signed)
OB Triage Discharge Summary   24 y.o. G2P0010 @ 8080w4d presenting for non reactive NST in the office. Her BPP was 8/8 in the office.  Fetal status reassuring. EFM showed 135 baseline, +accels, no decels, mod variability. +irritability on toco.  Labs pending: none RTC next week  Triage evaluation done remotely: Yes.  EFM reviewed prior to discharge  Regina Chambers, Jr MD [facility]  Pager: [phone]

## 2014-08-23 NOTE — OB Triage Note (Signed)
Nonreactive NST in office today.  Here for repeat NST.

## 2014-08-30 ENCOUNTER — Inpatient Hospital Stay
Admission: EM | Admit: 2014-08-30 | Discharge: 2014-09-04 | DRG: 765 | Disposition: A | Discharge status: Home / Self Care | Payer: Managed Care, Other (non HMO) | Attending: Obstetrics and Gynecology | Admitting: Obstetrics and Gynecology

## 2014-08-30 DIAGNOSIS — O403XX0 Polyhydramnios, third trimester, not applicable or unspecified: Secondary | ICD-10-CM | POA: Diagnosis present

## 2014-08-30 DIAGNOSIS — Z3A39 39 weeks gestation of pregnancy: Secondary | ICD-10-CM | POA: Diagnosis present

## 2014-08-30 DIAGNOSIS — Z6841 Body Mass Index (BMI) 40.0 and over, adult: Secondary | ICD-10-CM

## 2014-08-30 DIAGNOSIS — O99214 Obesity complicating childbirth: Principal | ICD-10-CM | POA: Diagnosis present

## 2014-08-30 DIAGNOSIS — D62 Acute posthemorrhagic anemia: Secondary | ICD-10-CM | POA: Diagnosis present

## 2014-08-30 LAB — CBC
HCT: 35.7 % (ref 35.0–47.0)
Hemoglobin: 12.2 g/dL (ref 12.0–16.0)
MCH: 28.4 pg (ref 26.0–34.0)
MCHC: 34.2 g/dL (ref 32.0–36.0)
MCV: 83.1 fL (ref 80.0–100.0)
Platelets: 256 10*3/uL (ref 150–440)
RBC: 4.3 MIL/uL (ref 3.80–5.20)
RDW: 13.6 % (ref 11.5–14.5)
WBC: 9.3 10*3/uL (ref 3.6–11.0)

## 2014-08-30 LAB — TYPE AND SCREEN
ABO/RH(D): A POS
Antibody Screen: NEGATIVE

## 2014-08-30 LAB — ABO/RH: ABO/RH(D): A POS

## 2014-08-30 MED ORDER — LIDOCAINE HCL (PF) 1 % IJ SOLN
30.0000 mL | INTRAMUSCULAR | Status: DC | PRN
  Filled 2014-08-30: qty 30

## 2014-08-30 MED ORDER — OXYTOCIN BOLUS FROM INFUSION: 500.0000 mL | INTRAVENOUS | Status: DC

## 2014-08-30 MED ORDER — OXYTOCIN 10 UNIT/ML IJ SOLN: 10.0000 [IU] | Freq: Once | INTRAMUSCULAR | Status: DC

## 2014-08-30 MED ORDER — CITRIC ACID-SODIUM CITRATE 334-500 MG/5ML PO SOLN: 30.0000 mL | ORAL | Status: DC | PRN

## 2014-08-30 MED ORDER — LACTATED RINGERS IV SOLN
INTRAVENOUS | Status: DC
  Administered 2014-08-30 – 2014-08-31 (×3): via INTRAVENOUS
  Administered 2014-08-31: 1000 mL via INTRAVENOUS
  Administered 2014-08-31 – 2014-09-02 (×4): via INTRAVENOUS

## 2014-08-30 MED ORDER — ONDANSETRON HCL 4 MG/2ML IJ SOLN: 4.0000 mg | Freq: Four times a day (QID) | INTRAMUSCULAR | Status: DC | PRN

## 2014-08-30 MED ORDER — LACTATED RINGERS IV SOLN
500.0000 mL | INTRAVENOUS | Status: DC | PRN
  Administered 2014-09-01: 500 mL via INTRAVENOUS

## 2014-08-30 MED ORDER — OXYTOCIN 40 UNITS IN LACTATED RINGERS INFUSION - SIMPLE MED
62.5000 mL/h | INTRAVENOUS | Status: DC
  Filled 2014-08-30: qty 1000

## 2014-08-30 NOTE — H&P (Signed)
Reviewed H&P from Dr Bonney Aid.  His note has a type-O that states her BPP was 6/8. It was 4/8 per his report and review of the BPP report.  She has been on continuous fetal monitoring with continue category 1 tracing.  Plan for induction tonight.  Fetal vertex verified by bedside ultrasound performed by Farrel Conners, CNM. Discussed plan with patient.  Questions answered.

## 2014-08-31 LAB — RPR: RPR Ser Ql: NONREACTIVE

## 2014-08-31 MED ORDER — LIDOCAINE HCL (PF) 1 % IJ SOLN
INTRAMUSCULAR | Status: AC
  Filled 2014-08-31: qty 30

## 2014-08-31 MED ORDER — OXYTOCIN 40 UNITS IN LACTATED RINGERS INFUSION - SIMPLE MED
1.0000 m[IU]/min | INTRAVENOUS | Status: DC
  Administered 2014-08-31: 1 m[IU]/min via INTRAVENOUS

## 2014-08-31 MED ORDER — OXYTOCIN 10 UNIT/ML IJ SOLN
INTRAMUSCULAR | Status: AC
  Filled 2014-08-31: qty 2

## 2014-08-31 MED ORDER — AMMONIA AROMATIC IN INHA
RESPIRATORY_TRACT | Status: AC
  Filled 2014-08-31: qty 10

## 2014-08-31 MED ORDER — DINOPROSTONE 10 MG VA INST
10.0000 mg | VAGINAL_INSERT | Freq: Once | VAGINAL | Status: AC
  Administered 2014-08-31: 10 mg via VAGINAL
  Filled 2014-08-31: qty 1

## 2014-08-31 MED ORDER — TERBUTALINE SULFATE 1 MG/ML IJ SOLN: 0.2500 mg | Freq: Once | INTRAMUSCULAR | Status: AC | PRN

## 2014-08-31 MED ORDER — MISOPROSTOL 25 MCG QUARTER TABLET
25.0000 ug | ORAL_TABLET | ORAL | Status: DC | PRN
  Administered 2014-08-31 (×3): 25 ug via ORAL
  Filled 2014-08-31: qty 0.25
  Filled 2014-08-31 (×3): qty 1

## 2014-08-31 NOTE — Progress Notes (Signed)
Subjective:  No concerns or problems  Objective:   Vitals: Blood pressure 114/68, pulse 82, temperature 98 F (36.7 C), temperature source Oral, resp. rate 20, height  (1.575 m), weight 106.142 kg (234 lb), last menstrual period 12/03/2013. General: NAd  Abdomen: GRAVID, soft Cervical Exam:  Dilation: 1 Effacement (%): 50 Cervical Position: Posterior Station: Ballotable Presentation: Vertex Exam by:: Dr. Bonney Aid  FHT: 140, moderate, positive accels, no decels Toco: q6-54min irregular  Results for orders placed or performed during the hospital encounter of 08/30/14 (from the past 24 hour(s))  CBC     Status: None   Collection Time: 08/30/14  2:09 PM  Result Value Ref Range   WBC 9.3 3.6 - 11.0 K/uL   RBC 4.30 3.80 - 5.20 MIL/uL   Hemoglobin 12.2 12.0 - 16.0 g/dL   HCT 16.1 09.6 - 04.5 %   MCV 83.1 80.0 - 100.0 fL   MCH 28.4 26.0 - 34.0 pg   MCHC 34.2 32.0 - 36.0 g/dL   RDW 40.9 81.1 - 91.4 %   Platelets 256 150 - 440 K/uL  Type and screen     Status: None   Collection Time: 08/30/14  2:09 PM  Result Value Ref Range   ABO/RH(D) A POS    Antibody Screen NEG    Sample Expiration 09/02/2014   RPR     Status: None   Collection Time: 08/30/14  2:09 PM  Result Value Ref Range   RPR Ser Ql Non Reactive Non Reactive  ABO/Rh     Status: None   Collection Time: 08/30/14  2:10 PM  Result Value Ref Range   ABO/RH(D) A POS     Assessment:   24 y.o. G2P0010 [redacted]w[redacted]d with polyhydramnios, non-reassuring antenatal testing with BPP of 6/10, morbid obesity  Plan:   1) Labor -  Continue IOL, attempted foley bulb placement unsuccessful because still posterior.  Will switch to po cytotec  2) Fetus - cat I tracing

## 2014-08-31 NOTE — Progress Notes (Signed)
Subjective:  Doing well, not particularly uncomfortable with contractions  Objective:   Vitals: Blood pressure 121/76, pulse 119, temperature 98.7 F (37.1 C), temperature source Oral, resp. rate 19, height  (1.575 m), weight 106.142 kg (234 lb), last menstrual period 12/03/2013. General:  Abdomen: Cervical Exam:  Dilation: 1 Effacement (%): 50 Cervical Position: Posterior Station: Ballotable Presentation: Vertex Exam by:: Dr. Bonney Aid  FHT: 140, moderate, positive accels, no decels Toco: irregular  No results found for this or any previous visit (from the past 24 hour(s)).  Assessment:   24 y.o. G2P0010 [redacted]w[redacted]d polyhyramnios, obesity, non-reassuring antenatal fetal surveillance  Plan:   1) Labor - continue active manage - start pitocin, still to posterior to place foley bulb  2) Fetus - cat I tracing

## 2014-08-31 NOTE — Plan of Care (Signed)
Patient able to have clear liquid tray; room service notified. Up to BR, reports small amount of blood on tissue when she wiped. Desires to sit in rocking chair. Warm pack to low back. Adjust Toco and [place]. Loyola Mast, RN

## 2014-09-01 ENCOUNTER — Inpatient Hospital Stay: Payer: Managed Care, Other (non HMO) | Admitting: Anesthesiology

## 2014-09-01 ENCOUNTER — Encounter: Payer: Self-pay | Admitting: Anesthesiology

## 2014-09-01 LAB — PLATELET COUNT: PLATELETS: 232 10*3/uL (ref 150–440)

## 2014-09-01 MED ORDER — BUTORPHANOL TARTRATE 1 MG/ML IJ SOLN
INTRAMUSCULAR | Status: AC
  Administered 2014-09-01: 1 mg via INTRAVENOUS
  Filled 2014-09-01: qty 1

## 2014-09-01 MED ORDER — EPHEDRINE 5 MG/ML INJ
10.0000 mg | INTRAVENOUS | Status: DC | PRN
  Filled 2014-09-01: qty 2

## 2014-09-01 MED ORDER — BUTORPHANOL TARTRATE 1 MG/ML IJ SOLN
1.0000 mg | INTRAMUSCULAR | Status: DC | PRN
  Administered 2014-09-01 (×2): 1 mg via INTRAVENOUS

## 2014-09-01 MED ORDER — PHENYLEPHRINE 40 MCG/ML (10ML) SYRINGE FOR IV PUSH (FOR BLOOD PRESSURE SUPPORT)
80.0000 ug | PREFILLED_SYRINGE | INTRAVENOUS | Status: DC | PRN
  Filled 2014-09-01: qty 2

## 2014-09-01 MED ORDER — BUPIVACAINE HCL (PF) 0.25 % IJ SOLN
INTRAMUSCULAR | Status: DC | PRN
  Administered 2014-09-01: 5 mL

## 2014-09-01 MED ORDER — DIPHENHYDRAMINE HCL 50 MG/ML IJ SOLN: 12.5000 mg | INTRAMUSCULAR | Status: DC | PRN

## 2014-09-01 MED ORDER — FENTANYL 2.5 MCG/ML W/ROPIVACAINE 0.2% IN NS 100 ML EPIDURAL INFUSION (ARMC-ANES)
9.0000 mL/h | EPIDURAL | Status: DC
  Administered 2014-09-01: 9 mL/h via EPIDURAL
  Administered 2014-09-01: 10 mL/h via EPIDURAL
  Filled 2014-09-01 (×2): qty 100

## 2014-09-01 NOTE — Progress Notes (Signed)
Re-ultrasound to reconfirm vertex, OP.

## 2014-09-01 NOTE — Progress Notes (Signed)
Subjective:  Starting to feel stronger contractions  Objective:   Vitals: Blood pressure 119/76, pulse 86, temperature 97.6 F (36.4 C), temperature source Oral, resp. rate 16, height  (1.575 m), weight 106.142 kg (234 lb), last menstrual period 12/03/2013, SpO2 98 %. General: NAD Abdomen:  Cervical Exam:  Dilation: 4.5 Effacement (%): 50 Cervical Position: Middle Station: -3 Presentation: Vertex Exam by:: Gustavus Messing, RN  FHT: 150, moderate variability, no accels ocassional earlies/variables Toco:  Results for orders placed or performed during the hospital encounter of 08/30/14 (from the past 24 hour(s))  Platelet count     Status: None   Collection Time: 09/01/14 11:16 AM  Result Value Ref Range   Platelets 232 150 - 440 K/uL    Assessment:   24 y.o. G2P0010 [redacted]w[redacted]d IOL for polyhydramnios, morbid obesity, and non-reassuring antenatal testing at term  Plan:   1) Labor - continue pitocin, IUPC and FSE placed  2) Fetus - cat I tracing

## 2014-09-01 NOTE — Anesthesia Preprocedure Evaluation (Addendum)
Anesthesia Evaluation  Patient identified by MRN, date of birth, ID band Patient awake    Reviewed: Allergy & Precautions, NPO status , Patient's Chart, lab work & pertinent test results, reviewed documented beta blocker date and time   Airway Mallampati: II  TM Distance: >3 FB     Dental  (+) Chipped   Pulmonary           Cardiovascular      Neuro/Psych    GI/Hepatic   Endo/Other    Renal/GU      Musculoskeletal   Abdominal   Peds  Hematology   Anesthesia Other Findings   Reproductive/Obstetrics                             Anesthesia Physical Anesthesia Plan  ASA: II  Anesthesia Plan: Epidural   Post-op Pain Management:    Induction:   Airway Management Planned:   Additional Equipment:   Intra-op Plan:   Post-operative Plan:   Informed Consent: I have reviewed the patients History and Physical, chart, labs and discussed the procedure including the risks, benefits and alternatives for the proposed anesthesia with the patient or authorized representative who has indicated his/her understanding and acceptance.     Plan Discussed with:   Anesthesia Plan Comments:         Anesthesia Quick Evaluation  

## 2014-09-01 NOTE — Progress Notes (Signed)
Subjective:  Feeling increasingly painful contractions.  SROM this AM  Objective:   Vitals: Blood pressure 113/72, pulse 81, temperature 98.3 F (36.8 C), temperature source Oral, resp. rate 20, height  (1.575 m), weight 106.142 kg (234 lb), last menstrual period 12/03/2013. General: painfully contracting Abdomen: obese, gravid, non-tender Cervical Exam:  Dilation: 3 Effacement (%): 50 Cervical Position: Middle Station: -3 Presentation: Vertex Exam by:: Gustavus Messing, RN  FHT:  140, moderate, no accels, occasional variable  Toco: q44min  No results found for this or any previous visit (from the past 24 hour(s)).  Assessment:   24 y.o. G2P0010 [redacted]w[redacted]d with polyhydramnios, morbid obesity, and non-reassuring antental testing  Plan:   1) Labor -continue pitocin - contracting q44min  2) Fetus - category I tracing - ultrasound confirmed vtx again after SROM

## 2014-09-01 NOTE — Anesthesia Procedure Notes (Signed)
Epidural Patient location during procedure: OB  Staffing Anesthesiologist: Berdine Addison Performed by: anesthesiologist   Preanesthetic Checklist Completed: patient identified, site marked, surgical consent, pre-op evaluation, timeout performed, IV checked, risks and benefits discussed and monitors and equipment checked  Epidural Patient position: sitting Prep: Betadine Patient monitoring: heart rate, continuous pulse ox and blood pressure Approach: midline Location: L4-L5 Injection technique: LOR saline  Needle:  Needle type: Tuohy  Needle gauge: 18 G Needle length: 9 cm and 9 Catheter type: closed end flexible Catheter size: 20 Guage Test dose: negative and 1.5% lidocaine with Epi 1:200 K  Assessment Sensory level: T10 Events: blood not aspirated, injection not painful, no injection resistance, negative IV test and no paresthesia  Additional Notes   Patient tolerated the insertion well without complications. 1156 start. 1158 catheter. 1200 test dose. 1203 5 ml 0.25%marcaine. 1205 Infusion.Reason for block:procedure for pain

## 2014-09-02 ENCOUNTER — Encounter: Payer: Self-pay | Admitting: Anesthesiology

## 2014-09-02 ENCOUNTER — Other Ambulatory Visit: Payer: Managed Care, Other (non HMO)

## 2014-09-02 ENCOUNTER — Encounter: Payer: Self-pay | Admitting: *Deleted

## 2014-09-02 ENCOUNTER — Encounter
Admission: EM | Disposition: A | Discharge status: Home / Self Care | Payer: Self-pay | Source: Home / Self Care | Attending: Obstetrics and Gynecology

## 2014-09-02 SURGERY — Surgical Case
Anesthesia: Epidural

## 2014-09-02 MED ORDER — NALOXONE HCL 0.4 MG/ML IJ SOLN: 0.4000 mg | INTRAMUSCULAR | Status: DC | PRN

## 2014-09-02 MED ORDER — WITCH HAZEL-GLYCERIN EX PADS: 1.0000 "application " | MEDICATED_PAD | CUTANEOUS | Status: DC | PRN

## 2014-09-02 MED ORDER — CITRIC ACID-SODIUM CITRATE 334-500 MG/5ML PO SOLN
30.0000 mL | Freq: Once | ORAL | Status: AC
  Administered 2014-09-02: 30 mL via ORAL

## 2014-09-02 MED ORDER — SIMETHICONE 80 MG PO CHEW
80.0000 mg | CHEWABLE_TABLET | ORAL | Status: DC
  Filled 2014-09-02: qty 1

## 2014-09-02 MED ORDER — PRENATAL MULTIVITAMIN CH
1.0000 | ORAL_TABLET | Freq: Every day | ORAL | Status: DC
  Administered 2014-09-02 – 2014-09-03 (×2): 1 via ORAL
  Filled 2014-09-02 (×2): qty 1

## 2014-09-02 MED ORDER — LACTATED RINGERS IV SOLN: INTRAVENOUS | Status: DC

## 2014-09-02 MED ORDER — NALOXONE HCL 1 MG/ML IJ SOLN
1.0000 ug/kg/h | INTRAVENOUS | Status: DC | PRN
  Filled 2014-09-02: qty 2

## 2014-09-02 MED ORDER — DIPHENHYDRAMINE HCL 50 MG/ML IJ SOLN
12.5000 mg | INTRAMUSCULAR | Status: DC | PRN
  Administered 2014-09-02: 12.5 mg via INTRAVENOUS
  Filled 2014-09-02: qty 1

## 2014-09-02 MED ORDER — DIBUCAINE 1 % RE OINT: 1.0000 "application " | TOPICAL_OINTMENT | RECTAL | Status: DC | PRN

## 2014-09-02 MED ORDER — BUPIVACAINE 0.25 % ON-Q PUMP DUAL CATH 400 ML
400.0000 mL | INJECTION | Status: DC
  Filled 2014-09-02: qty 400

## 2014-09-02 MED ORDER — DIPHENHYDRAMINE HCL 25 MG PO CAPS
25.0000 mg | ORAL_CAPSULE | Freq: Four times a day (QID) | ORAL | Status: DC | PRN
  Administered 2014-09-02 (×3): 25 mg via ORAL
  Filled 2014-09-02 (×2): qty 1

## 2014-09-02 MED ORDER — OXYTOCIN 10 UNIT/ML IJ SOLN
INTRAMUSCULAR | Status: DC | PRN
  Administered 2014-09-02: 40 [IU] via INTRAMUSCULAR

## 2014-09-02 MED ORDER — SCOPOLAMINE 1 MG/3DAYS TD PT72
1.0000 | MEDICATED_PATCH | Freq: Once | TRANSDERMAL | Status: DC
  Filled 2014-09-02: qty 1

## 2014-09-02 MED ORDER — PHENYLEPHRINE HCL 10 MG/ML IJ SOLN
INTRAMUSCULAR | Status: DC | PRN
  Administered 2014-09-02 (×2): 200 ug via INTRAVENOUS
  Administered 2014-09-02: 300 ug via INTRAVENOUS
  Administered 2014-09-02 (×3): 200 ug via INTRAVENOUS
  Administered 2014-09-02: 300 ug via INTRAVENOUS

## 2014-09-02 MED ORDER — LIDOCAINE HCL (PF) 2 % IJ SOLN
INTRAMUSCULAR | Status: DC | PRN
  Administered 2014-09-02: 200 mg via EPIDURAL
  Administered 2014-09-02: 100 mg via EPIDURAL

## 2014-09-02 MED ORDER — MENTHOL 3 MG MT LOZG: 1.0000 | LOZENGE | OROMUCOSAL | Status: DC | PRN

## 2014-09-02 MED ORDER — BUPIVACAINE HCL (PF) 0.5 % IJ SOLN: 10.0000 mL | Freq: Once | INTRAMUSCULAR | Status: DC

## 2014-09-02 MED ORDER — KETOROLAC TROMETHAMINE 30 MG/ML IJ SOLN
30.0000 mg | Freq: Four times a day (QID) | INTRAMUSCULAR | Status: AC | PRN
  Administered 2014-09-02 – 2014-09-03 (×3): 30 mg via INTRAVENOUS
  Filled 2014-09-02 (×3): qty 1

## 2014-09-02 MED ORDER — NALBUPHINE HCL 10 MG/ML IJ SOLN
5.0000 mg | INTRAMUSCULAR | Status: DC | PRN
  Filled 2014-09-02: qty 0.5

## 2014-09-02 MED ORDER — CEFAZOLIN SODIUM-DEXTROSE 2-3 GM-% IV SOLR
2.0000 g | INTRAVENOUS | Status: AC
  Administered 2014-09-02: 2 g via INTRAVENOUS

## 2014-09-02 MED ORDER — SODIUM CHLORIDE 0.9 % IJ SOLN: 3.0000 mL | INTRAMUSCULAR | Status: DC | PRN

## 2014-09-02 MED ORDER — BUPIVACAINE 0.5 % ON-Q PUMP DUAL CATH 300 ML
INJECTION | Status: DC | PRN
  Administered 2014-09-02: 30 mL

## 2014-09-02 MED ORDER — KETOROLAC TROMETHAMINE 30 MG/ML IJ SOLN
INTRAMUSCULAR | Status: DC | PRN
  Administered 2014-09-02: 30 mg via INTRAVENOUS

## 2014-09-02 MED ORDER — SIMETHICONE 80 MG PO CHEW
80.0000 mg | CHEWABLE_TABLET | Freq: Three times a day (TID) | ORAL | Status: DC
Start: 2014-09-02 — End: 2014-09-04
  Administered 2014-09-02 – 2014-09-04 (×6): 80 mg via ORAL
  Filled 2014-09-02 (×5): qty 1

## 2014-09-02 MED ORDER — ONDANSETRON HCL 4 MG/2ML IJ SOLN: 4.0000 mg | Freq: Three times a day (TID) | INTRAMUSCULAR | Status: DC | PRN

## 2014-09-02 MED ORDER — OXYTOCIN 40 UNITS IN LACTATED RINGERS INFUSION - SIMPLE MED
INTRAVENOUS | Status: AC
  Filled 2014-09-02: qty 1000

## 2014-09-02 MED ORDER — KETOROLAC TROMETHAMINE 30 MG/ML IJ SOLN: 30.0000 mg | Freq: Four times a day (QID) | INTRAMUSCULAR | Status: AC | PRN

## 2014-09-02 MED ORDER — LANOLIN HYDROUS EX OINT: 1.0000 "application " | TOPICAL_OINTMENT | CUTANEOUS | Status: DC | PRN

## 2014-09-02 MED ORDER — ZOLPIDEM TARTRATE 5 MG PO TABS: 5.0000 mg | ORAL_TABLET | Freq: Every evening | ORAL | Status: DC | PRN

## 2014-09-02 MED ORDER — DIPHENHYDRAMINE HCL 25 MG PO CAPS
25.0000 mg | ORAL_CAPSULE | ORAL | Status: DC | PRN
  Filled 2014-09-02 (×2): qty 1

## 2014-09-02 MED ORDER — ONDANSETRON HCL 4 MG/2ML IJ SOLN
INTRAMUSCULAR | Status: DC | PRN
  Administered 2014-09-02: 4 mg via INTRAVENOUS

## 2014-09-02 MED ORDER — MORPHINE SULFATE (PF) 0.5 MG/ML IJ SOLN
INTRAMUSCULAR | Status: DC | PRN
  Administered 2014-09-02: .5 mg via EPIDURAL
  Administered 2014-09-02: 2.5 mg via INTRAVENOUS
  Administered 2014-09-02: 2 mg via EPIDURAL

## 2014-09-02 MED ORDER — CITRIC ACID-SODIUM CITRATE 334-500 MG/5ML PO SOLN
ORAL | Status: AC
  Filled 2014-09-02: qty 15

## 2014-09-02 MED ORDER — ACETAMINOPHEN 325 MG PO TABS
650.0000 mg | ORAL_TABLET | ORAL | Status: DC | PRN
  Administered 2014-09-02: 650 mg via ORAL
  Filled 2014-09-02: qty 2

## 2014-09-02 MED ORDER — SIMETHICONE 80 MG PO CHEW: 80.0000 mg | CHEWABLE_TABLET | ORAL | Status: DC | PRN

## 2014-09-02 MED ORDER — NALBUPHINE HCL 10 MG/ML IJ SOLN
5.0000 mg | Freq: Once | INTRAMUSCULAR | Status: AC | PRN
  Filled 2014-09-02: qty 0.5

## 2014-09-02 MED ORDER — SENNOSIDES-DOCUSATE SODIUM 8.6-50 MG PO TABS
2.0000 | ORAL_TABLET | ORAL | Status: DC
  Administered 2014-09-03: 2 via ORAL
  Filled 2014-09-02: qty 2
  Filled 2014-09-02: qty 1

## 2014-09-02 MED ORDER — OXYTOCIN 40 UNITS IN LACTATED RINGERS INFUSION - SIMPLE MED
62.5000 mL/h | INTRAVENOUS | Status: AC
  Administered 2014-09-02: 62.5 mL/h via INTRAVENOUS
  Filled 2014-09-02: qty 1000

## 2014-09-02 MED ORDER — MEPERIDINE HCL 25 MG/ML IJ SOLN: 6.2500 mg | INTRAMUSCULAR | Status: DC | PRN

## 2014-09-02 MED ORDER — CEFAZOLIN SODIUM-DEXTROSE 2-3 GM-% IV SOLR
INTRAVENOUS | Status: AC
  Filled 2014-09-02: qty 50

## 2014-09-02 MED ORDER — BUPIVACAINE HCL (PF) 0.5 % IJ SOLN
INTRAMUSCULAR | Status: AC
  Filled 2014-09-02: qty 30

## 2014-09-02 SURGICAL SUPPLY — 28 items
BAG COUNTER SPONGE EZ (MISCELLANEOUS) ×2 IMPLANT
CANISTER SUCT 3000ML (MISCELLANEOUS) ×3 IMPLANT
CATH KIT ON-Q SILVERSOAK 5IN (CATHETERS) ×6 IMPLANT
CHLORAPREP W/TINT 26ML (MISCELLANEOUS) ×6 IMPLANT
CLOSURE WOUND 1/2 X4 (GAUZE/BANDAGES/DRESSINGS) ×1
COUNTER SPONGE BAG EZ (MISCELLANEOUS) ×1
DRSG TELFA 3X8 NADH (GAUZE/BANDAGES/DRESSINGS) ×3 IMPLANT
ELECT CAUTERY BLADE 6.4 (BLADE) ×3 IMPLANT
GAUZE SPONGE 4X4 12PLY STRL (GAUZE/BANDAGES/DRESSINGS) ×3 IMPLANT
GLOVE BIO SURGEON STRL SZ7 (GLOVE) ×3 IMPLANT
GLOVE INDICATOR 7.5 STRL GRN (GLOVE) ×3 IMPLANT
GOWN STRL REUS W/ TWL LRG LVL3 (GOWN DISPOSABLE) ×3 IMPLANT
GOWN STRL REUS W/TWL LRG LVL3 (GOWN DISPOSABLE) ×6
LIQUID BAND (GAUZE/BANDAGES/DRESSINGS) ×3 IMPLANT
NS IRRIG 1000ML POUR BTL (IV SOLUTION) ×3 IMPLANT
PACK C SECTION AR (MISCELLANEOUS) ×3 IMPLANT
PAD GROUND ADULT SPLIT (MISCELLANEOUS) ×3 IMPLANT
PAD OB MATERNITY 4.3X12.25 (PERSONAL CARE ITEMS) ×3 IMPLANT
PAD PREP 24X41 OB/GYN DISP (PERSONAL CARE ITEMS) ×3 IMPLANT
STRIP CLOSURE SKIN 1/2X4 (GAUZE/BANDAGES/DRESSINGS) ×2 IMPLANT
SUT MNCRL AB 4-0 PS2 18 (SUTURE) ×3 IMPLANT
SUT PDS AB 1 TP1 96 (SUTURE) ×6 IMPLANT
SUT VIC AB 0 CT1 36 (SUTURE) ×6 IMPLANT
SUT VIC AB 0 CTX 36 (SUTURE) ×4
SUT VIC AB 0 CTX36XBRD ANBCTRL (SUTURE) ×2 IMPLANT
SUT VIC AB 2-0 CT1 27 (SUTURE) ×2
SUT VIC AB 2-0 CT1 36 (SUTURE) ×3 IMPLANT
SUT VIC AB 2-0 CT1 TAPERPNT 27 (SUTURE) ×1 IMPLANT

## 2014-09-02 NOTE — Progress Notes (Signed)
Subjective:  Doing well, still feeling some pain lower pelvis does not want epidural replaced  Objective:  Vitals: Blood pressure 124/79, pulse 85, temperature 98.4 F (36.9 C), temperature source Oral, resp. rate 16, height  (1.575 m), weight 106.142 kg (234 lb), last menstrual period 12/03/2013, SpO2 98 %. General: NAD Abdomen:Gravid, soft, non-tender Cervical Exam:  Dilation: 5 Effacement (%): 60 Cervical Position: Middle Station: -2 Presentation: Vertex Exam by:: RS  FHT: 145, moderate, no accels, prolonged decel followed by 4 concurrent late decelerations while I was in the room Toco: q2-19min  Results for orders placed or performed during the hospital encounter of 08/30/14 (from the past 24 hour(s))  Platelet count     Status: None   Collection Time: 09/01/14 11:16 AM  Result Value Ref Range   Platelets 232 150 - 440 K/uL    Assessment:   24 y.o. G2P0010 [redacted]w[redacted]d IOL for polyhydramnios, morbid obesity BMI of 42, and non-reassuring antenatal testing with BPP of 6/10 at [redacted]w[redacted]d  Plan:   1) Labor - stop pitocin secondary to fetal intolerance of labor (good recovery after cessation).  Has been unchanged throughout the course of the day.  Discussed pros, cons as well as risk associated with C-section in detatail with the patient  2) Fetus - category III tracing improved to category I with cessation of pitocin.  Given the fetal heart rate findings, and the initial reason for induction being BPP 6/10 do not feel that the fetus will tolerate further attempts at induction. - EFW 49%ile 6lbs 6oz at 37 weeks

## 2014-09-02 NOTE — Op Note (Signed)
Preoperative Diagnosis: 1) 24 y.o. G2P1011 at [redacted]w[redacted]d 2) Fetal intolerance to labor 3) Morbid obesity BMI 42 4) Polyhdramnios 5) Non-reassuring antenatal testing  Postoperative Diagnosis: 1) 24 y.o. G2P1011 at [redacted]w[redacted]d 2) Fetal intolerance to labor 3) Morbid obesity BMI 42 4) Polyhdramnios 5) Non-reassuring antenatal testing 6) OP and asynclitic   Operation Performed: Primary low transverse C-section via pfannenstiel skin incision  Indication: Patient with recurrent late deceleration on day 2 of induction for BPP 6/10, failure to progress past 5cm  Anesthesia: Epidural  Primary Surgeon: [name], MD  Assistant: Kandice Hams  Preoperative Antibiotics: 2g ancef  Estimated Blood Loss:  IV Fluids:  Urine Output::  Drains or Tubes: Foley to gravity drainage, ON-Q catheter system  Implants: none  Specimens Removed: none  Complications: none  Intraoperative Findings:  Normal tubes ovaries and uterus.  Delivery resulted in the birth of a liveborn female, APGAR (1 MIN): 7   APGAR (5 MINS): 9   APGAR (10 MINS):  , weight  6lbs 6oz  Patient Condition: stable  Procedure in Detail:  Patient was taken to the operating room were she was administered regional anesthesia.  She was positioned in the supine position, prepped and draped in the  Usual sterile fashion.  Prior to proceeding with the case a time out was performed and the level of anesthetic was checked and noted to be adequate.  Utilizing the scalpel a pfannenstiel skin incision was made 2cm above the pubic symphysis and carried down sharply to the the level of the rectus fascia.  The fascia was incised in the midline using the scalpel and then extended using mayo scissors.  The superior border of the rectus fascia was grasped with two Kocher clamps and the underlying rectus muscles were dissected of the fascia using blunt dissection.  The median raphae was incised using Mayo scissors.   The inferior  border of the rectus fascia was dissected of the rectus muscles in a similar fashion.  The midline was identified, the peritoneum was entered bluntly and expanded using manual tractions.  The uterus was noted to be in a none rotated position.  Next the bladder blade was placed retracting the bladder caudally.  A bladder flap was not created.  A low transverse incision was scored on the lower uterine segment.  The hysterotomy was entered bluntly using the operators finger.  The hysterotomy incision was extended using manual traction.  The operators hand was placed within the hysterotomy position noting the fetus to be within the OP position and asynclitic position.  The vertex was grasped, flexed, brought to the incision, and delivered a traumatically using fundal pressure.  The remainder of the body delivered with ease.  The infant was suctioned, cord was clamped and cut before handing off to the awaiting neonatologist.  The placenta was delivered using manual extraction.  The uterus was exteriorized, wiped clean of clots and debris using two moist laps.  The hysterotomy was closed using a two layer closure of 0 Vicryl, with the first being a running locked, the second a vertical imbricating.  The uterus was returned to the abdomen.  The peritoneal gutters were wiped clean of clots and debris using two moist laps.  The hysterotomy incision was re-inspected noted to be hemostatic.  The rectus muscles were re-approximated in the midline using a single 2-0 Vicryl mattress stitch.  The rectus muscles were inspected noted to be hemostatic.  The superior border of the rectus fascia was grasped with a  Kocher clamp.  The ON-Q trocars were then placed 4cm above the superior border of the incision and tunneled subfascially.  The introducers were removed and the catheters were threaded through the sleeves after which the sleeves were removed.  The fascia was closed using a looped #1 PDS in a running fashion taking 1cm by 1cm  bites.  The subcutaneous tissue was irrigated using warm saline, hemostasis achieved using the bovis.  The subcutaneous dead space was more than 3cm and was closed.  The bladder reflection was grasped with a pickup, and Metzenbaum scissors were then used the undermine the bladder reflection.  The subcutaneous dead space was obliterated by using a 53-T 0 Chromic in a running fashion.  The skin was closed using 2-0 Vicryl interrupted subdermal sutures and staples.  Sponge needle and instrument counts were corrects times two.  The patient tolerated the procedure well and was taken to the recovery room in stable condition.

## 2014-09-02 NOTE — Discharge Summary (Signed)
Obstetric Discharge Summary Reason for Admission: induction of labor and non-reassuring antenatal surveillance, polyhydramnios Prenatal Procedures: NST Intrapartum Procedures: cesarean: low cervical, transverse Postpartum Procedures: MMR Complications-Operative and Postpartum: none HEMOGLOBIN  Date Value Ref Range Status  09/03/2014 10.3* 12.0 - 16.0 g/dL Final   HGB  Date Value Ref Range Status  12/02/2013 12.3 12.0-16.0 g/dL Final   HCT  Date Value Ref Range Status  09/03/2014 30.9* 35.0 - 47.0 % Final  12/02/2013 38.6 35.0-47.0 % Final    Physical Exam:  General: alert, in NAD. Reports tolerating regular diet and passing flatus. Voiding without difficulty Desires ON Q removed Lochia: appropriate Abdomen: obese, not tympanic, BS present, NT Incision: clean, noninflammed and staples intact DVT Evaluation: No evidence of DVT seen on physical exam. +1 to +2 edema of lower extremities  Discharge Diagnoses: Term Pregnancy-delivered, polyhydraminos, non reassuring antenatal surveillance, fetal intolerance to labor, failure to progress  Discharge Information: Date: 09/04/2014 Activity: pelvic rest x 6 weeks, no heavy lifting x 6 weeks, No driving x 2 weeks Diet: routine Medications: Percocet, prenatal vitamins, Fusion Plus, Colace Condition: stable Instructions: refer to practice specific booklet Discharge to: home Follow-up Information    Follow up with Dorthula Nettles, MD. Schedule an appointment as soon as possible for a visit today.   Specialty:  Obstetrics and Gynecology   Why:  For staple removal in 4-6 days   Contact information:   [address] [place] [address] [phone]       Newborn Data: Live born female/ [name] Birth Weight: 6 lb 6.3 oz (2900 g) APGAR: 7, 9  Home with mother/ Breast Contraception: undecided, to be discussed at followup appt.  Nekayla Heider 09/04/2014, 11:32 AM

## 2014-09-02 NOTE — Transfer of Care (Signed)
Immediate Anesthesia Transfer of Care Note  Patient: Regina Chambers  Procedure(s) Performed: * No procedures listed *  Patient Location: Pacu  Anesthesia Type:Epidural  Level of Consciousness: awake, alert  and oriented  Airway & Oxygen Therapy: Patient Spontanous Breathing and Patient connected to nasal cannula oxygen  Post-op Assessment: Report given to RN and Post -op Vital signs reviewed and stable  Post vital signs: Reviewed and stable  Last Vitals:  Filed Vitals:   09/02/14 0150  BP: 124/79  Pulse:   Temp:   Resp:     Complications: No apparent anesthesia complications

## 2014-09-02 NOTE — Progress Notes (Signed)
EFM off.  Patient to OR via bed for Urgent C/S

## 2014-09-03 ENCOUNTER — Encounter: Admission: RE | Payer: Self-pay | Source: Ambulatory Visit

## 2014-09-03 ENCOUNTER — Inpatient Hospital Stay
Admission: RE | Admit: 2014-09-03 | Payer: Managed Care, Other (non HMO) | Source: Ambulatory Visit | Admitting: Obstetrics and Gynecology

## 2014-09-03 ENCOUNTER — Encounter: Payer: Self-pay | Admitting: Obstetrics and Gynecology

## 2014-09-03 LAB — CBC
HCT: 30.9 % — ABNORMAL LOW (ref 35.0–47.0)
HEMOGLOBIN: 10.3 g/dL — AB (ref 12.0–16.0)
MCH: 27.7 pg (ref 26.0–34.0)
MCHC: 33.5 g/dL (ref 32.0–36.0)
MCV: 82.7 fL (ref 80.0–100.0)
PLATELETS: 194 10*3/uL (ref 150–440)
RBC: 3.74 MIL/uL — ABNORMAL LOW (ref 3.80–5.20)
RDW: 13.7 % (ref 11.5–14.5)
WBC: 10.3 10*3/uL (ref 3.6–11.0)

## 2014-09-03 SURGERY — Surgical Case
Anesthesia: Choice

## 2014-09-03 MED ORDER — DOCUSATE SODIUM 100 MG PO CAPS
100.0000 mg | ORAL_CAPSULE | Freq: Two times a day (BID) | ORAL | Status: DC
  Administered 2014-09-03: 100 mg via ORAL
  Filled 2014-09-03: qty 1

## 2014-09-03 MED ORDER — FERROUS SULFATE 325 (65 FE) MG PO TABS
325.0000 mg | ORAL_TABLET | Freq: Every day | ORAL | Status: DC
  Administered 2014-09-03: 325 mg via ORAL
  Filled 2014-09-03: qty 1

## 2014-09-03 MED ORDER — IBUPROFEN 600 MG PO TABS
600.0000 mg | ORAL_TABLET | Freq: Four times a day (QID) | ORAL | Status: DC | PRN
  Administered 2014-09-03 – 2014-09-04 (×3): 600 mg via ORAL
  Filled 2014-09-03 (×3): qty 1

## 2014-09-03 MED ORDER — OXYCODONE-ACETAMINOPHEN 5-325 MG PO TABS
1.0000 | ORAL_TABLET | Freq: Four times a day (QID) | ORAL | Status: DC | PRN
  Administered 2014-09-03 – 2014-09-04 (×4): 2 via ORAL
  Filled 2014-09-03 (×4): qty 2

## 2014-09-03 NOTE — Anesthesia Post-op Follow-up Note (Signed)
  Anesthesia Pain Follow-up Note  Patient: Regina Chambers  Day #: 1  Date of Follow-up: 09/03/2014 Time: 7:40 AM  Last Vitals:  Filed Vitals:   09/03/14 0425  BP: 123/77  Pulse: 108  Temp: 37.4 C  Resp: 18    Level of Consciousness: alert  Pain: none   Side Effects:None  Catheter Site Exam:clean, dry, no drainage  Plan: D/C from anesthesia care  Starling Manns

## 2014-09-03 NOTE — Progress Notes (Signed)
  Subjective:  Sore, but otherwise doing well. Baby breastfeeding without difficulty. Passing flatus and voided this AM after foley discontinued. Tolerating a regular diet.  Objective:  Blood pressure 110/67, pulse 94, temperature 98.9 F (37.2 C), temperature source Oral, resp. rate 19, height  (1.575 m), weight 234 lb (106.142 kg), last menstrual period 12/03/2013, SpO2 96 %, unknown if currently breastfeeding.  General: NAD Pulmonary: no increased work of breathing, lungs CTA Abdomen: obese, mildly tympanic, appropriately tender, fundus firm at level of umbilicus-1FB Incision: dressing C+D+I Extremities: +1 to +2 edema, no erythema, no tenderness  Results for orders placed or performed during the hospital encounter of 08/30/14 (from the past 72 hour(s))  Platelet count     Status: None   Collection Time: 09/01/14 11:16 AM  Result Value Ref Range   Platelets 232 150 - 440 K/uL  CBC     Status: Abnormal   Collection Time: 09/03/14  5:24 AM  Result Value Ref Range   WBC 10.3 3.6 - 11.0 K/uL   RBC 3.74 (L) 3.80 - 5.20 MIL/uL   Hemoglobin 10.3 (L) 12.0 - 16.0 g/dL   HCT 16.1 (L) 09.6 - 04.5 %   MCV 82.7 80.0 - 100.0 fL   MCH 27.7 26.0 - 34.0 pg   MCHC 33.5 32.0 - 36.0 g/dL   RDW 40.9 81.1 - 91.4 %   Platelets 194 150 - 440 K/uL     Assessment:   24 y.o. G2P1011 postoperativeday # 1-stable Normal return of bowel and bladder function May shower today Dressing change after shower Plan:  1) Acute blood loss anemia - hemodynamically stable and asymptomatic - po ferrous sulfate/ vitamins  2) --/--/A POS (07/22 1410) / Rubella Nonimmune (01/07 0000) / Varicella immune  3) TDAP UTD  4) Breast  5) Disposition

## 2014-09-03 NOTE — Anesthesia Postprocedure Evaluation (Signed)
  Anesthesia Post-op Note  Patient: Regina Chambers  Procedure(s) Performed: Procedure(s): CESAREAN SECTION (N/A)  Anesthesia type:Epidural  Patient location: 343  Post pain: Pain level controlled  Post assessment: Post-op Vital signs reviewed, Patient's Cardiovascular Status Stable, Respiratory Function Stable, Patent Airway and No signs of Nausea or vomiting  Post vital signs: Reviewed and stable  Last Vitals:  Filed Vitals:   09/03/14 0425  BP: 123/77  Pulse: 108  Temp: 37.4 C  Resp: 18    Level of consciousness: awake, alert  and patient cooperative  Complications: No apparent anesthesia complications

## 2014-09-04 MED ORDER — FUSION PLUS PO CAPS: 1.0000 | ORAL_CAPSULE | ORAL | Status: DC

## 2014-09-04 MED ORDER — DOCUSATE SODIUM 100 MG PO CAPS: 100.0000 mg | ORAL_CAPSULE | Freq: Two times a day (BID) | ORAL | Status: DC

## 2014-09-04 MED ORDER — OXYCODONE-ACETAMINOPHEN 5-325 MG PO TABS: 1.0000 | ORAL_TABLET | Freq: Four times a day (QID) | ORAL | Status: DC | PRN

## 2014-09-04 MED ORDER — MEASLES, MUMPS & RUBELLA VAC ~~LOC~~ INJ
0.5000 mL | INJECTION | Freq: Once | SUBCUTANEOUS | Status: AC
  Administered 2014-09-04: 0.5 mL via SUBCUTANEOUS
  Filled 2014-09-04: qty 0.5

## 2014-09-04 MED ORDER — MEASLES, MUMPS & RUBELLA VAC ~~LOC~~ INJ: 0.5000 mL | INJECTION | Freq: Once | SUBCUTANEOUS | Status: DC

## 2014-09-04 MED ORDER — IBUPROFEN 600 MG PO TABS: 600.0000 mg | ORAL_TABLET | Freq: Four times a day (QID) | ORAL | Status: DC | PRN

## 2014-09-04 NOTE — Anesthesia Postprocedure Evaluation (Signed)
  Anesthesia Post-op Note  Patient: Regina Chambers  Procedure(s) Performed: Epidural  Anesthesia type:No value filed.  Patient location: Room 343 Post pain: Pain level controlled  Post assessment: Post-op Vital signs reviewed, Patient's Cardiovascular Status Stable, Respiratory Function Stable, Patent Airway and No signs of Nausea or vomiting  Post vital signs: Reviewed and stable  Last Vitals:  Filed Vitals:   09/04/14 0441  BP:   Pulse:   Temp: 36.7 C  Resp:     Level of consciousness: awake, alert  and patient cooperative  Complications: No apparent anesthesia complications

## 2014-09-04 NOTE — Discharge Instructions (Signed)
Cesarean Delivery, Care After Refer to this sheet in the next few weeks. These instructions provide you with information on caring for yourself after your procedure. Your health care provider may also give you specific instructions. Your treatment has been planned according to current medical practices, but problems sometimes occur. Call your health care provider if you have any problems or questions after you go home. HOME CARE INSTRUCTIONS  Only take over-the-counter or prescription medications as directed by your health care provider.  Do not drink alcohol, especially if you are breastfeeding or taking medication to relieve pain.  Do not  smoke tobacco.  Continue to use good perineal care. Good perineal care includes:  Wiping your perineum from front to back.  Keeping your perineum clean.  Check your surgical cut (incision) daily for increased redness, drainage, swelling, or separation of skin.  Shower and clean your incision gently with soap and water every day, by letting warm and soapy water run over the incision, and then pat it dry. If your health care provider says it is okay, leave the incision uncovered. Use a bandage (dressing) if the incision is draining fluid or appears irritated. If the adhesive strips across the incision do not fall off within 7 days, carefully peel them off, after a shower.  Hug a pillow when coughing or sneezing until your incision is healed. This helps to relieve pain.  Do not use tampons, douches or have sexual intercourse, until your health care provider says it is okay.  Wear a well-fitting bra that provides breast support.  Limit wearing support panties or control-top hose.  Drink enough fluids to keep your urine clear or pale yellow.  Eat high-fiber foods such as whole grain cereals and breads, brown rice, beans, and fresh fruits and vegetables every day. These foods may help prevent or relieve constipation.  Resume activities such as  climbing stairs, driving, lifting, exercising, or traveling as directed by your health care provider.  Try to have someone help you with your household activities and your newborn for at least a few days after you leave the hospital.  Rest as much as possible. Try to rest or take a nap when your newborn is sleeping.  Increase your activities gradually.  Do not lift more than 15lbs until directed by a provider.  Keep all of your scheduled postpartum appointments. It is very important to keep your scheduled follow-up appointments. At these appointments, your health care provider will be checking to make sure that you are healing physically and emotionally. SEEK MEDICAL CARE IF:   You are passing large clots from your vagina. Save any clots to show your health care provider.  You have a foul smelling discharge from your vagina.  You have trouble urinating.  You are urinating frequently.  You have pain when you urinate.  You have a change in your bowel movements.  You have increasing redness, pain, or swelling near your incision.  You have pus draining from your incision.  Your incision is separating.  You have painful, hard, or reddened breasts.  You have a severe headache.  You have blurred vision or see spots.  You feel sad or depressed.  You have thoughts of hurting yourself or your newborn.  You have questions about your care, the care of your newborn, or medications.  You are dizzy or light-headed.  You have a rash.  You have pain, redness, or swelling at the site of the removed intravenous access (IV) tube.  You have  nausea or vomiting.  You stopped breastfeeding and have not had a menstrual period within 12 weeks of stopping.  You are not breastfeeding and have not had a menstrual period within 12 weeks of delivery.  You have a fever. SEEK IMMEDIATE MEDICAL CARE IF:  You have persistent pain.  You have chest pain.  You have shortness of breath.  You  faint.  You have leg pain.  You have stomach pain.  Your vaginal bleeding saturates 2 or more sanitary pads in 1 hour. MAKE SURE YOU:   Understand these instructions.  Will watch your condition.  Will get help right away if you are not doing well or get worse. Document Released: 10/17/2001 Document Revised: 06/11/2013 Document Reviewed: 09/22/2011 [facility] Patient Information 2015 [place], [place]. This information is not intended to replace advice given to you by your health care provider. Make sure you discuss any questions you have with your health care provider.   Cesarean Delivery, Care After Refer to this sheet in the next few weeks. These instructions provide you with information on caring for yourself after your procedure. Your health care provider may also give you specific instructions. Your treatment has been planned according to current medical practices, but problems sometimes occur. Call your health care provider if you have any problems or questions after you go home. HOME CARE INSTRUCTIONS  Only take over-the-counter or prescription medications as directed by your health care provider.  Do not drink alcohol, especially if you are breastfeeding or taking medication to relieve pain.  Do not  smoke tobacco.  Continue to use good perineal care. Good perineal care includes:  Wiping your perineum from front to back.  Keeping your perineum clean.  Check your surgical cut (incision) daily for increased redness, drainage, swelling, or separation of skin.  Shower and clean your incision gently with soap and water every day, by letting warm and soapy water run over the incision, and then pat it dry. If your health care provider says it is okay, leave the incision uncovered. Use a bandage (dressing) if the incision is draining fluid or appears irritated. If the adhesive strips across the incision do not fall off within 7 days, carefully peel them off, after a shower.  Hug a  pillow when coughing or sneezing until your incision is healed. This helps to relieve pain.  Do not use tampons, douches or have sexual intercourse, until your health care provider says it is okay.  Wear a well-fitting bra that provides breast support.  Limit wearing support panties or control-top hose.  Drink enough fluids to keep your urine clear or pale yellow.  Eat high-fiber foods such as whole grain cereals and breads, brown rice, beans, and fresh fruits and vegetables every day. These foods may help prevent or relieve constipation.  Resume activities such as climbing stairs, driving, lifting, exercising, or traveling as directed by your health care provider.  Try to have someone help you with your household activities and your newborn for at least a few days after you leave the hospital.  Rest as much as possible. Try to rest or take a nap when your newborn is sleeping.  Increase your activities gradually.  Do not lift more than 15lbs until directed by a provider.  Keep all of your scheduled postpartum appointments. It is very important to keep your scheduled follow-up appointments. At these appointments, your health care provider will be checking to make sure that you are healing physically and emotionally. SEEK MEDICAL CARE IF:  You are passing large clots from your vagina. Save any clots to show your health care provider.  You have a foul smelling discharge from your vagina.  You have trouble urinating.  You are urinating frequently.  You have pain when you urinate.  You have a change in your bowel movements.  You have increasing redness, pain, or swelling near your incision.  You have pus draining from your incision.  Your incision is separating.  You have painful, hard, or reddened breasts.  You have a severe headache.  You have blurred vision or see spots.  You feel sad or depressed.  You have thoughts of hurting yourself or your newborn.  You have  questions about your care, the care of your newborn, or medications.  You are dizzy or light-headed.  You have a rash.  You have pain, redness, or swelling at the site of the removed intravenous access (IV) tube.  You have nausea or vomiting.  You stopped breastfeeding and have not had a menstrual period within 12 weeks of stopping.  You are not breastfeeding and have not had a menstrual period within 12 weeks of delivery.  You have a fever. SEEK IMMEDIATE MEDICAL CARE IF:  You have persistent pain.  You have chest pain.  You have shortness of breath.  You faint.  You have leg pain.  You have stomach pain.  Your vaginal bleeding saturates 2 or more sanitary pads in 1 hour. MAKE SURE YOU:   Understand these instructions.  Will watch your condition.  Will get help right away if you are not doing well or get worse. Document Released: 10/17/2001 Document Revised: 06/11/2013 Document Reviewed: 09/22/2011 [facility] Patient Information 2015 [place], [place]. This information is not intended to replace advice given to you by your health care provider. Make sure you discuss any questions you have with your health care provider.

## 2014-09-05 LAB — MEASLES/MUMPS/RUBELLA IMMUNITY
Mumps IgG: <9 AU/mL — ABNORMAL LOW (ref >10.9)
Rubella: <0.9 index — ABNORMAL LOW (ref >0.99)
Rubeola IgG: <25 AU/mL — ABNORMAL LOW (ref >29.9)

## 2014-09-05 NOTE — Anesthesia Postprocedure Evaluation (Signed)
  Anesthesia Post-op Note  Patient: Regina Chambers  Procedure(s) Performed: Procedure(s): CESAREAN SECTION (N/A)  Anesthesia type:Epidural  Patient location: PACU  Post pain: Pain level controlled  Post assessment: Post-op Vital signs reviewed, Patient's Cardiovascular Status Stable, Respiratory Function Stable, Patent Airway and No signs of Nausea or vomiting  Post vital signs: Reviewed and stable  Last Vitals:  Filed Vitals:   09/04/14 1146  BP:   Pulse:   Temp: 36.8 C  Resp:     Level of consciousness: awake, alert  and patient cooperative  Complications: No apparent anesthesia complications

## 2014-10-29 HISTORY — PX: INTRAUTERINE DEVICE (IUD) INSERTION: SHX5877

## 2014-11-14 ENCOUNTER — Emergency Department
Admission: EM | Admit: 2014-11-14 | Discharge: 2014-11-14 | Disposition: A | Discharge status: Home / Self Care | Payer: Managed Care, Other (non HMO) | Attending: Emergency Medicine | Admitting: Emergency Medicine

## 2014-11-14 ENCOUNTER — Emergency Department: Payer: Managed Care, Other (non HMO)

## 2014-11-14 ENCOUNTER — Encounter: Payer: Self-pay | Admitting: *Deleted

## 2014-11-14 DIAGNOSIS — Z79899 Other long term (current) drug therapy: Secondary | ICD-10-CM | POA: Insufficient documentation

## 2014-11-14 DIAGNOSIS — R42 Dizziness and giddiness: Secondary | ICD-10-CM | POA: Diagnosis not present

## 2014-11-14 DIAGNOSIS — R0600 Dyspnea, unspecified: Secondary | ICD-10-CM | POA: Insufficient documentation

## 2014-11-14 DIAGNOSIS — R0789 Other chest pain: Secondary | ICD-10-CM | POA: Insufficient documentation

## 2014-11-14 DIAGNOSIS — R079 Chest pain, unspecified: Secondary | ICD-10-CM | POA: Diagnosis present

## 2014-11-14 DIAGNOSIS — R0602 Shortness of breath: Secondary | ICD-10-CM | POA: Insufficient documentation

## 2014-11-14 LAB — BASIC METABOLIC PANEL
Anion gap: 9 (ref 5–15)
BUN: 13 mg/dL (ref 6–20)
CALCIUM: 9.4 mg/dL (ref 8.9–10.3)
CO2: 27 mmol/L (ref 22–32)
CREATININE: 0.73 mg/dL (ref 0.44–1.00)
Chloride: 102 mmol/L (ref 101–111)
GFR calc non Af Amer: >60 mL/min (ref >60)
GLUCOSE: 89 mg/dL (ref 65–99)
Potassium: 3.4 mmol/L — ABNORMAL LOW (ref 3.5–5.1)
Sodium: 138 mmol/L (ref 135–145)

## 2014-11-14 LAB — CBC
HCT: 39.1 % (ref 35.0–47.0)
Hemoglobin: 12.9 g/dL (ref 12.0–16.0)
MCH: 26.8 pg (ref 26.0–34.0)
MCHC: 32.9 g/dL (ref 32.0–36.0)
MCV: 81.6 fL (ref 80.0–100.0)
PLATELETS: 320 10*3/uL (ref 150–440)
RBC: 4.8 MIL/uL (ref 3.80–5.20)
RDW: 14.6 % — ABNORMAL HIGH (ref 11.5–14.5)
WBC: 8 10*3/uL (ref 3.6–11.0)

## 2014-11-14 LAB — TROPONIN I: Troponin I: <0.03 ng/mL (ref <0.031)

## 2014-11-14 MED ORDER — TRAMADOL HCL 50 MG PO TABS: 50.0000 mg | ORAL_TABLET | Freq: Once | ORAL | Status: DC

## 2014-11-14 MED ORDER — TRAMADOL HCL 50 MG PO TABS
50.0000 mg | ORAL_TABLET | Freq: Four times a day (QID) | ORAL | Status: DC | PRN
Start: 2014-11-14 — End: 2015-04-10

## 2014-11-14 NOTE — Discharge Instructions (Signed)

## 2014-11-14 NOTE — ED Provider Notes (Signed)
[facility] Emergency Department Provider Note  Time seen: 10:12 PM  I have reviewed the triage vital signs and the nursing notes.   HISTORY  Chief Complaint Dizziness; Shortness of Breath; and Chest Pain    HPI Regina Chambers is a 24 y.o. female with no past medical history, recent pregnancy with delivery 3 months ago, presents the emergency department with intermittent dizziness, lightheadedness, chest pain and shortness of breath for the past 2 weeks. Patient states for the past 1-2 years she has had intermittent chest pain and shortness of breath, she states over the past 2 weeks of shortness of breath has worsened, and has been so she would dizziness, lightheadedness at times. Today she noted 2 different episodes where she felt as if she cannot catch her breath so she came to the emergency department for evaluation. States the pain as 5/10, worse with palpation of the area, or movement.     Past Medical History  Diagnosis Date  . Polycystic disease, ovaries   . Cervical insufficiency in pregnancy, antepartum     prior pregnancy    Patient Active Problem List   Diagnosis Date Noted  . Postpartum care following cesarean delivery 09/04/2014    Past Surgical History  Procedure Laterality Date  . Cervical cerclage  2016  . Cesarean section N/A 09/02/2014    Procedure: CESAREAN SECTION;  Surgeon: Malachy Mood, MD;  Location: [facility] ORS;  Service: Obstetrics;  Laterality: N/A;    Current Outpatient Rx  Name  Route  Sig  Dispense  Refill  . docusate sodium (COLACE) 100 MG capsule   Oral   Take 1 capsule (100 mg total) by mouth 2 (two) times daily.   10 capsule   0   . ibuprofen (ADVIL,MOTRIN) 600 MG tablet   Oral   Take 1 tablet (600 mg total) by mouth every 6 (six) hours as needed for fever or headache.   50 tablet   0   . Iron-FA-B Cmp-C-Biot-Probiotic (FUSION PLUS) CAPS   Oral   Take 1 capsule by mouth 1 day or 1 dose.   30  capsule   1   . measles, mumps and rubella vaccine (MMR) injection   Subcutaneous   Inject 0.5 mLs into the skin once.   1 each   0   . Multiple Vitamin (MULTIVITAMIN) tablet   Oral   Take 1 tablet by mouth daily.           Allergies Review of patient's allergies indicates no known allergies.  History reviewed. No pertinent family history.  Social History Social History  Substance Use Topics  . Smoking status: Never Smoker   . Smokeless tobacco: Never Used  . Alcohol Use: No    Review of Systems Constitutional: Negative for fever. Cardiovascular: Positive for chest pain. Respiratory: Negative for shortness of breath. Gastrointestinal: Negative for abdominal pain Musculoskeletal: Negative for back pain. Neurological: Negative for headache 10-point ROS otherwise negative.  ____________________________________________   PHYSICAL EXAM:  VITAL SIGNS: ED Triage Vitals  Enc Vitals Group     BP 11/14/14 1843 142/81 mmHg     Pulse Rate 11/14/14 1843 70     Resp 11/14/14 1843 16     Temp 11/14/14 1843 98.4 F (36.9 C)     Temp Source 11/14/14 1843 Oral     SpO2 11/14/14 1843 100 %     Weight 11/14/14 1843 212 lb (96.163 kg)     Height 11/14/14 1843 _0  (1.575 m)  Head Cir --      Peak Flow --      Pain Score 11/14/14 1843 5     Pain Loc --      Pain Edu? --      Excl. in [place]? --     Constitutional: Alert and oriented. Well appearing and in no distress. Eyes: Normal exam ENT   Head: Normocephalic and atraumatic   Mouth/Throat: Mucous membranes are moist. Cardiovascular: Normal rate, regular rhythm. No murmur. Respiratory: Normal respiratory effort without tachypnea nor retractions. Breath sounds are clear. Moderate chest wall tenderness to palpation over the left chest. Gastrointestinal: Soft and nontender. No distention.  Musculoskeletal: Nontender with normal range of motion in all extremities Neurologic:  Normal speech and language. No gross  focal neurologic deficits Psychiatric: Mood and affect are normal. Speech and behavior are normal.  ____________________________________________    EKG  EKG reviewed and interpreted by myself shows normal sinus rhythm at 71 bpm, narrow QRS, normal axis, normal intervals, no ST changes noted. Overall normal EKG.  ____________________________________________    RADIOLOGY  Chest x-ray within normal limits  ____________________________________________   INITIAL IMPRESSION / ASSESSMENT AND PLAN / ED COURSE  Pertinent labs & imaging results that were available during my care of the patient were reviewed by me and considered in my medical decision making (see chart for details).  Patient presents for intermittent chest pain for greater than one year, shortness of breath is somewhat worse over the past 2 weeks with 2 episodes today. Denies any shortness of breath currently. Patient does have moderate chest wall tenderness to palpation. I discussed with the patient the need to follow up with cardiology for an ultrasound/echocardiogram and stress tests if deemed appropriate by them. Patient agreeable to plan. We will discharge on Ultram as needed for discomfort and cardiology follow-up.  ____________________________________________   FINAL CLINICAL IMPRESSION(S) / ED DIAGNOSES  Chest pain   Harvest Dark, MD 11/14/14 2215

## 2014-11-14 NOTE — ED Notes (Signed)
Pt to ED with onset of dizziness, lightheadedness, chest pain and SOB x 2 weeks. Pt states came in today due to two episodes of SOB and felt "like i couldn't catch my breath" Pt states pain is left side of chest/abd, tightness feeling along with a feeling of "having a lump in my throat" Pt states pain 5/10, vitals wnl, no acute distress noted. Pt with normal respirations, unlabored, talking complete and full sentences with no issues.

## 2014-11-14 NOTE — ED Notes (Signed)
Upon entering pt's room for discharge instructions, prescription and medication administration, pt or family member no long in room. No belongings noted in room. Pt not found in ER department. Pt left without discharge instructions and prescription.

## 2014-11-15 ENCOUNTER — Telehealth: Payer: Self-pay

## 2014-11-15 NOTE — Telephone Encounter (Signed)
Under encounter notes pt seen [facility] ED 11/14/14.

## 2014-11-15 NOTE — Telephone Encounter (Signed)
PLEASE NOTE: All timestamps contained within this report are represented as [place] Standard Time. CONFIDENTIALTY NOTICE: This fax transmission is intended only for the addressee. It contains information that is legally privileged, confidential or otherwise protected from use or disclosure. If you are not the intended recipient, you are strictly prohibited from reviewing, disclosing, copying using or disseminating any of this information or taking any action in reliance on or regarding this information. If you have received this fax in error, please notify us immediately by telephone so that we can arrange for its return to [place]. Phone: [phone], Toll-Free: [phone], Fax: [phone] Page: 1 of 2 Call Id: [id number] [facility] Day - Client TELEPHONE ADVICE RECORD [facility] Patient Name: Regina Chambers Gender: Female DOB: December 07, 1990 Age: 24 Y 4 M 20 D Return Phone Number: [phone] (Primary) Address: City/State/Zip:  Client [facility] Day - Client Client Site [facility] - Day Contact Type Call Call Type Triage / Clinical Relationship To Patient Self Appointment Disposition EMR Appointment Not Necessary Info pasted into Epic No Return Phone Number [phone] (Primary) Chief Complaint BREATHING - shortness of breath or sounds breathless Initial Comment Caller states she is experiencing dizziness and shortness of breath. PreDisposition Call Doctor Nurse Assessment Nurse: Whited, Charity fundraiser, Garment/textile technologist Date/Time (Eastern Time): 11/14/2014 4:39:59 PM Confirm and document reason for call. If symptomatic, describe symptoms. ---Caller states she is experiencing dizziness and shortness of breath. Going on since last week. Having pain (burning pain in chest) for over a year. Been treating with Tylenol and Ibuprofen. Had a baby 3 months ago. Has the patient traveled out of the country within the last  30 days? ---Not Applicable Does the patient have any new or worsening symptoms? ---Yes Will a triage be completed? ---Yes Related visit to physician within the last 2 weeks? ---No Does the PT have any chronic conditions? (i.e. diabetes, asthma, etc.) ---No Did the patient indicate they were pregnant? ---No Guidelines Guideline Title Affirmed Question Affirmed Notes Nurse Date/Time Lamount Cohen Time) Chest Pain Dizziness or lightheadedness Whited, RN, Dennie Bible 11/14/2014 4:43:31 PM Disp. Time Lamount Cohen Time) Disposition Final User 11/14/2014 4:37:24 PM Send to Urgent Queue Nelida Gores 11/14/2014 4:37:35 PM Send to Urgent Queue Nelida Gores 11/14/2014 4:48:36 PM Go to ED Now Yes Whited, RN, Pat PLEASE NOTE: All timestamps contained within this report are represented as [place] Standard Time. CONFIDENTIALTY NOTICE: This fax transmission is intended only for the addressee. It contains information that is legally privileged, confidential or otherwise protected from use or disclosure. If you are not the intended recipient, you are strictly prohibited from reviewing, disclosing, copying using or disseminating any of this information or taking any action in reliance on or regarding this information. If you have received this fax in error, please notify us immediately by telephone so that we can arrange for its return to [place]. Phone: [phone], Toll-Free: [phone], Fax: [phone] Page: 2 of 2 Call Id: [id number] Caller Understands: Yes Disagree/Comply: Comply Care Advice Given Per Guideline GO TO ED NOW: You need to be seen in the Emergency Department. Go to the ER at _________[facility] Hospital. Leave now. Drive carefully. NOTE TO TRIAGER - DRIVING: * Another adult should drive. * If immediate transportation is not available via car or taxi, then the patient should be instructed to call [id number]. BRING MEDICINES: * Please bring a list of your current medicines when you go to the Emergency Department  (ER). * It is also a good idea  to bring the pill bottles too. This will help the doctor to make certain you are taking the right medicines and the right dose. CALL EMS IF: * Severe difficulty breathing occurs * Passes out or becomes too weak to stand CARE ADVICE given per Chest Pain (Adult) guideline. * You become worse. After Care Instructions Given Call Event Type User Date / Time Description Referrals [facility] - ED

## 2015-04-10 ENCOUNTER — Encounter: Payer: Self-pay | Admitting: Internal Medicine

## 2015-04-10 ENCOUNTER — Ambulatory Visit (INDEPENDENT_AMBULATORY_CARE_PROVIDER_SITE_OTHER): Payer: Managed Care, Other (non HMO) | Admitting: Internal Medicine

## 2015-04-10 VITALS — BP 126/78 | HR 83 | Temp 98.8°F | Ht 62.25 in | Wt 224.5 lb

## 2015-04-10 DIAGNOSIS — E282 Polycystic ovarian syndrome: Secondary | ICD-10-CM

## 2015-04-10 DIAGNOSIS — R635 Abnormal weight gain: Secondary | ICD-10-CM | POA: Diagnosis not present

## 2015-04-10 MED ORDER — PHENTERMINE HCL 15 MG PO CAPS: 15.0000 mg | ORAL_CAPSULE | ORAL | Status: DC

## 2015-04-10 NOTE — Assessment & Plan Note (Signed)
Continue Metformin 

## 2015-04-10 NOTE — Progress Notes (Signed)
Pre visit review using our clinic review tool, if applicable. No additional management support is needed unless otherwise documented below in the visit note. 

## 2015-04-10 NOTE — Progress Notes (Signed)
HPI  Pt presents to the clinic today to establish care and for management of the conditions listed below. She is transferring care from Dr. Madelin Rear in [place], [place].  PCOS: She is taking Metformin once daily. She follows with Dr. Chauncey Cruel.  She is concerned about her weight. She weighs 224.8 lbs with a BMI of 40.73. She has been trying to exercise. She is walking on a treadmill for at least 35 minutes 4 times a week. She has cut soda and red meat out of her diet. For breakfast she eats eggs whites, protein bar and fruit. For lunch she will eat something like brussel sprouts. For dinner, she will eat chicken and salad. She drinks 8+ glasses's of water daily.  Flu: 2015 Tetanus: 07/10/2014 Pap Smear: 02/2013 Dentist: biannually  Past Medical History  Diagnosis Date  . Polycystic disease, ovaries   . Cervical insufficiency in pregnancy, antepartum     prior pregnancy  . Diabetes mellitus without complication (HCC)     Current Outpatient Prescriptions  Medication Sig Dispense Refill  . levonorgestrel (MIRENA) 20 MCG/24HR IUD 1 each by Intrauterine route once. Inserted 10/2014    . metFORMIN (GLUCOPHAGE-XR) 500 MG 24 hr tablet Take 1 tablet by mouth daily.    . Multiple Vitamin (MULTIVITAMIN) tablet Take 1 tablet by mouth daily.     No current facility-administered medications for this visit.    No Known Allergies  Family History  Problem Relation Age of Onset  . Heart disease Father   . Kidney disease Father     Social History   Social History  . Marital Status: Married    Spouse Name: N/A  . Number of Children: N/A  . Years of Education: N/A   Occupational History  . Not on file.   Social History Main Topics  . Smoking status: Never Smoker   . Smokeless tobacco: Never Used  . Alcohol Use: No  . Drug Use: No  . Sexual Activity: Yes   Other Topics Concern  . Not on file   Social History Narrative    ROS:  Constitutional: Pt reports weight gain. Denies fever, malaise,  fatigue, headache.Marland Kitchen  Respiratory: Denies difficulty breathing, shortness of breath, cough or sputum production.   Cardiovascular: Denies chest pain, chest tightness, palpitations or swelling in the hands or feet.  Neurological: Denies dizziness, difficulty with memory, difficulty with speech or problems with balance and coordination.  Psych: Denies anxiety, depression, SI/HI.  No other specific complaints in a complete review of systems (except as listed in HPI above).  PE: BP 126/78 mmHg  Pulse 83  Temp(Src) 98.8 F (37.1 C) (Oral)  Ht 5' 2.25" (1.581 m)  Wt 224 lb 8 oz (101.833 kg)  BMI 40.74 kg/m2  SpO2 98%  Wt Readings from Last 3 Encounters:  04/10/15 224 lb 8 oz (101.833 kg)  11/14/14 212 lb (96.163 kg)  08/31/14 234 lb (106.142 kg)    General: Appears her stated age, obese in NAD. Skin: Dry and intact. Cardiovascular: Normal rate and rhythm. S1,S2 noted.  No murmur, rubs or gallops noted.  Pulmonary/Chest: Normal effort and positive vesicular breath sounds. No respiratory distress. No wheezes, rales or ronchi noted.  Neurological: Alert and oriented.  Psychiatric: Mood and affect normal. Behavior is normal. Judgment and thought content normal.    BMET    Component Value Date/Time   NA 138 11/14/2014 1847   NA 141 12/02/2013 1607   K 3.4* 11/14/2014 1847   K 3.4* 12/02/2013 1607  CL 102 11/14/2014 1847   CL 106 12/02/2013 1607   CO2 27 11/14/2014 1847   CO2 26 12/02/2013 1607   GLUCOSE 89 11/14/2014 1847   GLUCOSE 105* 12/02/2013 1607   BUN 13 11/14/2014 1847   BUN 12 12/02/2013 1607   CREATININE 0.73 11/14/2014 1847   CREATININE 0.77 12/02/2013 1607   CALCIUM 9.4 11/14/2014 1847   CALCIUM 8.6 12/02/2013 1607   GFRNONAA >60 11/14/2014 1847   GFRNONAA >60 12/02/2013 1607   GFRNONAA >60 10/16/2013 1459   GFRAA >60 11/14/2014 1847   GFRAA >60 12/02/2013 1607   GFRAA >60 10/16/2013 1459    Lipid Panel  No results found for: CHOL, TRIG, HDL, CHOLHDL,  VLDL, LDLCALC  CBC    Component Value Date/Time   WBC 8.0 11/14/2014 1847   WBC 9.9 12/02/2013 1607   RBC 4.80 11/14/2014 1847   RBC 4.52 12/02/2013 1607   HGB 12.9 11/14/2014 1847   HGB 12.3 12/02/2013 1607   HCT 39.1 11/14/2014 1847   HCT 38.6 12/02/2013 1607   PLT 320 11/14/2014 1847   PLT 326 12/02/2013 1607   MCV 81.6 11/14/2014 1847   MCV 85 12/02/2013 1607   MCH 26.8 11/14/2014 1847   MCH 27.2 12/02/2013 1607   MCHC 32.9 11/14/2014 1847   MCHC 31.9* 12/02/2013 1607   RDW 14.6* 11/14/2014 1847   RDW 13.7 12/02/2013 1607   LYMPHSABS 2.0 11/05/2013 1720   LYMPHSABS 2.4 10/16/2013 1459   MONOABS 0.4 11/05/2013 1720   MONOABS 0.6 10/16/2013 1459   EOSABS 0.1 11/05/2013 1720   EOSABS 0.1 10/16/2013 1459   BASOSABS 0.0 11/05/2013 1720   BASOSABS 0.0 10/16/2013 1459    Hgb A1C No results found for: HGBA1C   Assessment and Plan:  Abnormal weight gain:  Encouraged her to pick up the exercise- add in some light weights Continue to eat healthy RX for Phentermine 15 mg daily  RTC in 1 month for weight check/med refill

## 2015-04-10 NOTE — Patient Instructions (Signed)

## 2015-04-14 ENCOUNTER — Other Ambulatory Visit: Payer: Self-pay | Admitting: Family Medicine

## 2015-04-14 DIAGNOSIS — R1011 Right upper quadrant pain: Secondary | ICD-10-CM

## 2015-04-21 ENCOUNTER — Ambulatory Visit
Admission: RE | Admit: 2015-04-21 | Discharge: 2015-04-21 | Disposition: A | Discharge status: Home / Self Care | Payer: Managed Care, Other (non HMO) | Source: Ambulatory Visit | Attending: Family Medicine | Admitting: Family Medicine

## 2015-04-21 ENCOUNTER — Other Ambulatory Visit: Payer: Managed Care, Other (non HMO)

## 2015-04-21 DIAGNOSIS — K8020 Calculus of gallbladder without cholecystitis without obstruction: Secondary | ICD-10-CM | POA: Insufficient documentation

## 2015-04-21 DIAGNOSIS — R1011 Right upper quadrant pain: Secondary | ICD-10-CM | POA: Diagnosis present

## 2015-04-25 ENCOUNTER — Other Ambulatory Visit: Payer: Managed Care, Other (non HMO)

## 2015-04-25 ENCOUNTER — Encounter: Payer: Self-pay | Admitting: *Deleted

## 2015-04-25 NOTE — Patient Instructions (Addendum)
  Your procedure is scheduled on: 05-02-15  Report to [facility] 2ND FLOOR To find out your arrival time please call [phone] between 1PM - 3PM on 05-01-15  Remember: Instructions that are not followed completely may result in serious medical risk, up to and including death, or upon the discretion of your surgeon and anesthesiologist your surgery may need to be rescheduled.    _X___ 1. Do not eat food or drink liquids after midnight. No gum chewing or hard candies.     _X___ 2. No Alcohol for 24 hours before or after surgery.   ____ 3. Bring all medications with you on the day of surgery if instructed.    ____ 4. Notify your doctor if there is any change in your medical condition     (cold, fever, infections).     Do not wear jewelry, make-up, hairpins, clips or nail polish.  Do not wear lotions, powders, or perfumes. You may wear deodorant.  Do not shave 48 hours prior to surgery. Men may shave face and neck.  Do not bring valuables to the hospital.    [facility] is not responsible for any belongings or valuables.               Contacts, dentures or bridgework may not be worn into surgery.  Leave your suitcase in the car. After surgery it may be brought to your room.  For patients admitted to the hospital, discharge time is determined by your treatment team.   Patients discharged the day of surgery will not be allowed to drive home.   Please read over the following fact sheets that you were given:    _X___ Take these medicines the morning of surgery with A SIP OF WATER:    1. ZANTAC  2. TAKE A ZANTAC Thursday NIGHT(05-01-15) BEFORE BED  3.   4.  5.  6.  ____ Fleet Enema (as directed)   ____ Use CHG Soap as directed  ____ Use inhalers on the day of surgery  _X___ Stop metformin 2 days prior to surgery-LAST DOSE ON Tuesday -(04-29-15)   ____ Take 1/2 of usual insulin dose the night before surgery and none on the morning of surgery.   ____ Stop  Coumadin/Plavix/aspirin-N/A  ____ Stop Anti-inflammatories-NO NSAIDS OR ASA PRODUCTS-TYLENOL OK TO TAKE   ____ Stop supplements until after surgery.    ____ Bring C-Pap to the hospital.

## 2015-05-02 ENCOUNTER — Ambulatory Visit
Admission: RE | Admit: 2015-05-02 | Discharge: 2015-05-02 | Disposition: A | Discharge status: Home / Self Care | Payer: Managed Care, Other (non HMO) | Source: Ambulatory Visit | Attending: Surgery | Admitting: Surgery

## 2015-05-02 ENCOUNTER — Encounter
Admission: RE | Disposition: A | Discharge status: Home / Self Care | Payer: Self-pay | Source: Ambulatory Visit | Attending: Surgery

## 2015-05-02 ENCOUNTER — Ambulatory Visit: Payer: Managed Care, Other (non HMO)

## 2015-05-02 ENCOUNTER — Ambulatory Visit: Payer: Managed Care, Other (non HMO) | Admitting: Anesthesiology

## 2015-05-02 ENCOUNTER — Encounter: Payer: Self-pay | Admitting: *Deleted

## 2015-05-02 DIAGNOSIS — Z79899 Other long term (current) drug therapy: Secondary | ICD-10-CM | POA: Diagnosis not present

## 2015-05-02 DIAGNOSIS — Z7984 Long term (current) use of oral hypoglycemic drugs: Secondary | ICD-10-CM | POA: Insufficient documentation

## 2015-05-02 DIAGNOSIS — Z6841 Body Mass Index (BMI) 40.0 and over, adult: Secondary | ICD-10-CM | POA: Diagnosis not present

## 2015-05-02 DIAGNOSIS — E282 Polycystic ovarian syndrome: Secondary | ICD-10-CM | POA: Diagnosis not present

## 2015-05-02 DIAGNOSIS — K219 Gastro-esophageal reflux disease without esophagitis: Secondary | ICD-10-CM | POA: Insufficient documentation

## 2015-05-02 DIAGNOSIS — K8010 Calculus of gallbladder with chronic cholecystitis without obstruction: Secondary | ICD-10-CM | POA: Insufficient documentation

## 2015-05-02 DIAGNOSIS — Z841 Family history of disorders of kidney and ureter: Secondary | ICD-10-CM | POA: Diagnosis not present

## 2015-05-02 DIAGNOSIS — Z419 Encounter for procedure for purposes other than remedying health state, unspecified: Secondary | ICD-10-CM

## 2015-05-02 DIAGNOSIS — N852 Hypertrophy of uterus: Secondary | ICD-10-CM | POA: Diagnosis not present

## 2015-05-02 DIAGNOSIS — Z811 Family history of alcohol abuse and dependence: Secondary | ICD-10-CM | POA: Insufficient documentation

## 2015-05-02 HISTORY — DX: Anxiety disorder, unspecified: F41.9

## 2015-05-02 HISTORY — DX: Gastro-esophageal reflux disease without esophagitis: K21.9

## 2015-05-02 HISTORY — PX: CHOLECYSTECTOMY: SHX55

## 2015-05-02 LAB — POCT PREGNANCY, URINE: PREG TEST UR: NEGATIVE

## 2015-05-02 SURGERY — LAPAROSCOPIC CHOLECYSTECTOMY WITH INTRAOPERATIVE CHOLANGIOGRAM
Anesthesia: General | Wound class: Clean Contaminated

## 2015-05-02 MED ORDER — LACTATED RINGERS IV SOLN
INTRAVENOUS | Status: DC
  Administered 2015-05-02 (×2): via INTRAVENOUS

## 2015-05-02 MED ORDER — SODIUM CHLORIDE 0.9 % IV SOLN
INTRAVENOUS | Status: DC | PRN
  Administered 2015-05-02: 10 mL

## 2015-05-02 MED ORDER — FENTANYL CITRATE (PF) 100 MCG/2ML IJ SOLN
INTRAMUSCULAR | Status: AC
  Administered 2015-05-02: 25 ug via INTRAVENOUS
  Filled 2015-05-02: qty 2

## 2015-05-02 MED ORDER — BUPIVACAINE-EPINEPHRINE (PF) 0.5% -1:200000 IJ SOLN
INTRAMUSCULAR | Status: AC
  Filled 2015-05-02: qty 30

## 2015-05-02 MED ORDER — ONDANSETRON HCL 4 MG/2ML IJ SOLN
INTRAMUSCULAR | Status: AC
  Administered 2015-05-02: 4 mg via INTRAVENOUS
  Filled 2015-05-02: qty 2

## 2015-05-02 MED ORDER — LIDOCAINE HCL (CARDIAC) 20 MG/ML IV SOLN
INTRAVENOUS | Status: DC | PRN
  Administered 2015-05-02: 50 mg via INTRAVENOUS

## 2015-05-02 MED ORDER — ROCURONIUM BROMIDE 100 MG/10ML IV SOLN
INTRAVENOUS | Status: DC | PRN
  Administered 2015-05-02 (×2): 10 mg via INTRAVENOUS
  Administered 2015-05-02: 40 mg via INTRAVENOUS

## 2015-05-02 MED ORDER — PROPOFOL 10 MG/ML IV BOLUS
INTRAVENOUS | Status: DC | PRN
  Administered 2015-05-02: 180 mg via INTRAVENOUS

## 2015-05-02 MED ORDER — ONDANSETRON HCL 4 MG/2ML IJ SOLN
4.0000 mg | Freq: Once | INTRAMUSCULAR | Status: AC | PRN
  Administered 2015-05-02: 4 mg via INTRAVENOUS

## 2015-05-02 MED ORDER — FENTANYL CITRATE (PF) 100 MCG/2ML IJ SOLN
25.0000 ug | INTRAMUSCULAR | Status: AC | PRN
  Administered 2015-05-02 (×6): 25 ug via INTRAVENOUS

## 2015-05-02 MED ORDER — BUPIVACAINE-EPINEPHRINE 0.5% -1:200000 IJ SOLN
INTRAMUSCULAR | Status: DC | PRN
  Administered 2015-05-02: 10 mL

## 2015-05-02 MED ORDER — HEPARIN SODIUM (PORCINE) 5000 UNIT/ML IJ SOLN
INTRAMUSCULAR | Status: AC
  Filled 2015-05-02: qty 1

## 2015-05-02 MED ORDER — HYDROCODONE-ACETAMINOPHEN 5-325 MG PO TABS: 1.0000 | ORAL_TABLET | ORAL | Status: DC | PRN

## 2015-05-02 MED ORDER — ONDANSETRON HCL 4 MG/2ML IJ SOLN
INTRAMUSCULAR | Status: DC | PRN
  Administered 2015-05-02: 4 mg via INTRAVENOUS

## 2015-05-02 MED ORDER — FENTANYL CITRATE (PF) 100 MCG/2ML IJ SOLN
INTRAMUSCULAR | Status: DC | PRN
  Administered 2015-05-02: 100 ug via INTRAVENOUS
  Administered 2015-05-02 (×2): 50 ug via INTRAVENOUS

## 2015-05-02 MED ORDER — SUGAMMADEX SODIUM 200 MG/2ML IV SOLN
INTRAVENOUS | Status: DC | PRN
  Administered 2015-05-02: 200 mg via INTRAVENOUS

## 2015-05-02 MED ORDER — DEXAMETHASONE SODIUM PHOSPHATE 10 MG/ML IJ SOLN
INTRAMUSCULAR | Status: DC | PRN
Start: 2015-05-02 — End: 2015-05-02
  Administered 2015-05-02: 5 mg via INTRAVENOUS

## 2015-05-02 MED ORDER — MIDAZOLAM HCL 2 MG/2ML IJ SOLN
INTRAMUSCULAR | Status: DC | PRN
  Administered 2015-05-02: 2 mg via INTRAVENOUS

## 2015-05-02 SURGICAL SUPPLY — 38 items
APPLIER CLIP ROT 10 11.4 M/L (STAPLE) ×3
CANISTER SUCT 1200ML W/VALVE (MISCELLANEOUS) ×3 IMPLANT
CANNULA DILATOR 10 W/SLV (CANNULA) ×2 IMPLANT
CANNULA DILATOR 10MM W/SLV (CANNULA) ×1
CATH REDDICK CHOLANGI 4FR 50CM (CATHETERS) ×3 IMPLANT
CHLORAPREP W/TINT 26ML (MISCELLANEOUS) ×3 IMPLANT
CLIP APPLIE ROT 10 11.4 M/L (STAPLE) ×1 IMPLANT
CLOSURE WOUND 1/2 X4 (GAUZE/BANDAGES/DRESSINGS) ×1
DRAPE SHEET LG 3/4 BI-LAMINATE (DRAPES) ×3 IMPLANT
ELECT REM PT RETURN 9FT ADLT (ELECTROSURGICAL) ×3
ELECTRODE REM PT RTRN 9FT ADLT (ELECTROSURGICAL) ×1 IMPLANT
GAUZE SPONGE 4X4 12PLY STRL (GAUZE/BANDAGES/DRESSINGS) ×3 IMPLANT
GLOVE BIO SURGEON STRL SZ7.5 (GLOVE) ×3 IMPLANT
GOWN STRL REUS W/ TWL LRG LVL3 (GOWN DISPOSABLE) ×4 IMPLANT
GOWN STRL REUS W/TWL LRG LVL3 (GOWN DISPOSABLE) ×8
IRRIGATION STRYKERFLOW (MISCELLANEOUS) ×1 IMPLANT
IRRIGATOR STRYKERFLOW (MISCELLANEOUS) ×3
IV NS 1000ML (IV SOLUTION) ×2
IV NS 1000ML BAXH (IV SOLUTION) ×1 IMPLANT
KIT RM TURNOVER STRD PROC AR (KITS) ×3 IMPLANT
LABEL OR SOLS (LABEL) ×3 IMPLANT
NDL INSUFF ACCESS 14 VERSASTEP (NEEDLE) ×3 IMPLANT
NEEDLE FILTER BLUNT 18X 1/2SAF (NEEDLE) ×2
NEEDLE FILTER BLUNT 18X1 1/2 (NEEDLE) ×1 IMPLANT
NEEDLE HYPO 25X1 1.5 SAFETY (NEEDLE) ×3 IMPLANT
NS IRRIG 500ML POUR BTL (IV SOLUTION) ×3 IMPLANT
PACK LAP CHOLECYSTECTOMY (MISCELLANEOUS) ×3 IMPLANT
SCISSORS METZENBAUM CVD 33 (INSTRUMENTS) ×3 IMPLANT
SEAL FOR SCOPE WARMER C3101 (MISCELLANEOUS) IMPLANT
SLEEVE ENDOPATH XCEL 5M (ENDOMECHANICALS) ×3 IMPLANT
STRIP CLOSURE SKIN 1/2X4 (GAUZE/BANDAGES/DRESSINGS) ×2 IMPLANT
SUT CHROMIC 5 0 RB 1 27 (SUTURE) ×3 IMPLANT
SUT VIC AB 0 CT2 27 (SUTURE) IMPLANT
SYR 3ML LL SCALE MARK (SYRINGE) ×3 IMPLANT
TROCAR XCEL NON-BLD 11X100MML (ENDOMECHANICALS) ×3 IMPLANT
TROCAR XCEL NON-BLD 5MMX100MML (ENDOMECHANICALS) ×3 IMPLANT
TUBING INSUFFLATOR HI FLOW (MISCELLANEOUS) ×3 IMPLANT
WATER STERILE IRR 1000ML POUR (IV SOLUTION) ×3 IMPLANT

## 2015-05-02 NOTE — Anesthesia Procedure Notes (Signed)
Procedure Name: Intubation Date/Time: 05/02/2015 2:46 PM Performed by: Almeta MonasFLETCHER, Rayder Sullenger Pre-anesthesia Checklist: Patient identified, Emergency Drugs available, Suction available and Patient being monitored Patient Re-evaluated:Patient Re-evaluated prior to inductionOxygen Delivery Method: Circle system utilized Preoxygenation: Pre-oxygenation with 100% oxygen Intubation Type: IV induction Ventilation: Mask ventilation without difficulty Laryngoscope Size: Mac and 3 Grade View: Grade I Tube type: Oral Tube size: 7.0 mm Number of attempts: 1 Airway Equipment and Method: Stylet Placement Confirmation: ETT inserted through vocal cords under direct vision,  positive ETCO2,  CO2 detector and breath sounds checked- equal and bilateral Secured at: 21 cm Tube secured with: Tape Dental Injury: Teeth and Oropharynx as per pre-operative assessment

## 2015-05-02 NOTE — Discharge Instructions (Signed)
AMBULATORY SURGERY  DISCHARGE INSTRUCTIONS   1) The drugs that you were given will stay in your system until tomorrow so for the next 24 hours you should not:  A) Drive an automobile B) Make any legal decisions C) Drink any alcoholic beverage   2) You may resume regular meals tomorrow.  Today it is better to start with liquids and gradually work up to solid foods.  You may eat anything you prefer, but it is better to start with liquids, then soup and crackers, and gradually work up to solid foods.   3) Please notify your doctor immediately if you have any unusual bleeding, trouble breathing, redness and pain at the surgery site, drainage, fever, or pain not relieved by medication.    4) Additional Instructions:        Please contact your physician with any problems or Same Day Surgery at [phone], Monday through Friday 6 am to 4 pm, or [place] at [facility] Main number at [phone].   Take Tylenol or Norco if needed for pain.  Remove dressings on Sunday. May shower Monday.  Avoid straining and heavy lifting for 1 week after surgery.

## 2015-05-02 NOTE — Anesthesia Preprocedure Evaluation (Signed)
Anesthesia Evaluation  Patient identified by MRN, date of birth, ID band Patient awake    Reviewed: Allergy & Precautions, NPO status , Patient's Chart, lab work & pertinent test results  Airway Mallampati: II       Dental  (+) Teeth Intact   Pulmonary neg pulmonary ROS,    Pulmonary exam normal        Cardiovascular negative cardio ROS   Rhythm:Regular Rate:Normal     Neuro/Psych negative neurological ROS     GI/Hepatic Neg liver ROS, GERD  ,  Endo/Other  negative endocrine ROS  Renal/GU negative Renal ROS     Musculoskeletal negative musculoskeletal ROS (+)   Abdominal (+) + obese,   Peds negative pediatric ROS (+)  Hematology negative hematology ROS (+)   Anesthesia Other Findings   Reproductive/Obstetrics                             Anesthesia Physical Anesthesia Plan  ASA: II  Anesthesia Plan: General   Post-op Pain Management:    Induction: Intravenous  Airway Management Planned: Oral ETT  Additional Equipment:   Intra-op Plan:   Post-operative Plan: Extubation in OR  Informed Consent: I have reviewed the patients History and Physical, chart, labs and discussed the procedure including the risks, benefits and alternatives for the proposed anesthesia with the patient or authorized representative who has indicated his/her understanding and acceptance.     Plan Discussed with: CRNA  Anesthesia Plan Comments:         Anesthesia Quick Evaluation

## 2015-05-02 NOTE — Op Note (Signed)
OPERATIVE REPORT  PREOPERATIVE DIAGNOSIS:  Chronic cholecystitis cholelithiasis  POSTOPERATIVE DIAGNOSIS: Chronic cholecystitis cholelithiasis  PROCEDURE: Laparoscopic cholecystectomy with cholangiogram  ANESTHESIA: General  SURGEON: Regina RollsWilton Bascom Biel M.D.  INDICATIONS: She has history of epigastric pains and ultrasound findings of gallstones.    With the patient on the operating table in the supine position under general endotracheal anesthesia the abdomen was prepared with ChloraPrep solution and draped in a sterile manner. A short incision was made in the inferior aspect of the umbilicus and carried down to the deep fascia which was grasped with a laryngeal hook. A Veress needle was inserted aspirated and irrigated with a saline solution. The gas insufflation was begun but with high pressures it appeared that the Veress needle had not reached the peritoneal cavity. The Veress needle was removed. Next the 10 mm 0 laparoscope was put into an 11 mm trocar and focused on the tissues and manipulated in however it appeared this time that the cannula would not reach the peritoneal cavity due to morbid obesity. the cannula was removed. the hook on the fascia was reinforced with an additional Coker clamp. With some additional manipulation it was possible to advance the cannula into the peritoneal cavity.  Another incision was made in the epigastrium slightly to the right of the midline to introduce an 11 mm cannula. 2 incisions were made in the lateral aspect of the right upper quadrant to introduce 2   5 mm cannulas. Initial inspection revealed the smooth surface of the liver and the location of the gallbladder. There was some mild enlargement of the uterus and also there appeared to be an aproximally 4 cm right ovarian cyst  The gallbladder was retracted towards the right shoulder.  The gallbladder neck was retracted inferiorly and laterally.  The porta hepatis was identified. The gallbladder was mobilized  with incision of the visceral peritoneum. The cystic duct was dissected free from surrounding structures. The cystic artery was dissected free from surrounding structures. A critical view of safety was demonstrated  An Endo Clip was placed across the cystic duct adjacent to the gallbladder neck. An incision was made in the cystic duct to introduce a Reddick catheter. The cholangiogram was done with injection of half-strength Conray 60 dye. This demonstrated the bile ducts and flow of dye into the duodenum. No retained stones were identified. The cholangiogram appeared normal. The Reddick catheter was removed. The cystic duct was doubly ligated with endoclips and divided. The cystic artery was controlled with double endoclips and divided. The gallbladder was dissected free from the liver with use of hook and cautery and blunt dissection. Bleeding was minimal and hemostasis was intact. The gallbladder was delivered up through the infraumbilical incision opened and suctioned.  The gallbladder was removed. There were tiny stones. The gallbladder was submitted in formalin for routine pathology. Cannulas removed allowing carbon dioxide to escape from the peritoneal cavity. The skin incisions were closed with interrupted 5-0 chromic subcutaneous suture benzoin and Steri-Strips. Gauze dressings were applied with paper tape.  The patient appeared to be in satisfactory condition and was prepared for transfer to the recovery room.  This operation took more than the usual amount of skill and effort and lasted 1 hour and 50 minutes which is longer than the usual operation. This is largely due to morbid obesity and difficulty entering the peritoneal cavity.  Regina Chambers M.D.

## 2015-05-02 NOTE — Transfer of Care (Signed)
Immediate Anesthesia Transfer of Care Note  Patient: Regina Chambers  Procedure(s) Performed: Procedure(s): LAPAROSCOPIC CHOLECYSTECTOMY WITH INTRAOPERATIVE CHOLANGIOGRAM (N/A)  Patient Location: PACU  Anesthesia Type:General  Level of Consciousness: sedated  Airway & Oxygen Therapy: Patient Spontanous Breathing and Patient connected to face mask oxygen  Post-op Assessment: Report given to RN  Post vital signs: Reviewed and stable  Last Vitals:  Filed Vitals:   05/02/15 1402 05/02/15 1659  BP: 120/86 132/75  Pulse: 80 106  Temp: 37.5 C 36.7 C  Resp: 16 20    Complications: No apparent anesthesia complications

## 2015-05-02 NOTE — H&P (Signed)
  She reports minimal right upper quadrant pain since the day of the office visit. She reports no change in overall condition.  Lab work reviewed.  As discussed the plan for laparoscopic cholecystectomy

## 2015-05-03 NOTE — Anesthesia Postprocedure Evaluation (Signed)
Anesthesia Post Note  Patient: Regina Chambers  Procedure(s) Performed: Procedure(s) (LRB): LAPAROSCOPIC CHOLECYSTECTOMY WITH INTRAOPERATIVE CHOLANGIOGRAM (N/A)  Patient location during evaluation: PACU Anesthesia Type: General Level of consciousness: awake and alert Pain management: pain level controlled Vital Signs Assessment: post-procedure vital signs reviewed and stable Respiratory status: spontaneous breathing, nonlabored ventilation, respiratory function stable and patient connected to nasal cannula oxygen Cardiovascular status: blood pressure returned to baseline and stable Postop Assessment: no signs of nausea or vomiting Anesthetic complications: no    Last Vitals:  Filed Vitals:   05/02/15 1821 05/02/15 1842  BP: 129/80 127/83  Pulse: 72   Temp:    Resp: 20 20    Last Pain:  Filed Vitals:   05/02/15 1843  PainSc: 3                  Lenard SimmerAndrew Cheryll Keisler

## 2015-05-05 ENCOUNTER — Encounter: Payer: Self-pay | Admitting: Surgery

## 2015-05-06 LAB — SURGICAL PATHOLOGY

## 2015-05-12 ENCOUNTER — Ambulatory Visit: Payer: Managed Care, Other (non HMO) | Admitting: Internal Medicine

## 2015-06-03 ENCOUNTER — Encounter: Payer: Self-pay | Admitting: Obstetrics

## 2015-08-04 ENCOUNTER — Ambulatory Visit (INDEPENDENT_AMBULATORY_CARE_PROVIDER_SITE_OTHER): Payer: Managed Care, Other (non HMO) | Admitting: Internal Medicine

## 2015-08-04 ENCOUNTER — Encounter: Payer: Self-pay | Admitting: Internal Medicine

## 2015-08-04 VITALS — BP 102/80 | HR 83 | Ht 62.22 in | Wt 221.0 lb

## 2015-08-04 DIAGNOSIS — R635 Abnormal weight gain: Secondary | ICD-10-CM | POA: Diagnosis not present

## 2015-08-04 MED ORDER — PHENTERMINE HCL 37.5 MG PO CAPS: 37.5000 mg | ORAL_CAPSULE | ORAL | Status: DC

## 2015-08-04 NOTE — Patient Instructions (Signed)

## 2015-08-04 NOTE — Progress Notes (Signed)
HPI  Pt presents to the clinic today to follow up abnormal weight gain.  She is was started on Phentermine [facility]. Her starting weight was 225, her weight today is 221, BMI of 40.16. She did not feel it was effective. She denied adverse side effects while on the medication. She ran out 1 week ago. She is walking on a treadmill for at least 30 minutes 5 times a week. She has cut soda and red meat out of her diet. For breakfast she eats eggs whites, protein bar and fruit. For lunch she will eat something like brussel sprouts. For dinner, she will eat chicken and salad. She drinks 8+ glasses's of water daily.   Past Medical History  Diagnosis Date  . Polycystic disease, ovaries   . Cervical insufficiency in pregnancy, antepartum     prior pregnancy  . Anxiety   . GERD (gastroesophageal reflux disease)     Current Outpatient Prescriptions  Medication Sig Dispense Refill  . levonorgestrel (MIRENA) 20 MCG/24HR IUD 1 each by Intrauterine route once. Inserted 10/2014    . metFORMIN (GLUCOPHAGE-XR) 500 MG 24 hr tablet Take 1 tablet by mouth daily.     No current facility-administered medications for this visit.    No Known Allergies  Family History  Problem Relation Age of Onset  . Heart disease Father   . Kidney disease Father   . Hypertension Father     Social History   Social History  . Marital Status: Married    Spouse Name: N/A  . Number of Children: N/A  . Years of Education: N/A   Occupational History  . Not on file.   Social History Main Topics  . Smoking status: Never Smoker   . Smokeless tobacco: Never Used  . Alcohol Use: No  . Drug Use: No  . Sexual Activity: Yes    Birth Control/ Protection: IUD   Other Topics Concern  . Not on file   Social History Narrative    ROS:  Constitutional: Pt reports weight gain. Denies fever, malaise, fatigue, headache.Marland Kitchen.  Respiratory: Denies difficulty breathing, shortness of breath, cough or sputum production.    Cardiovascular: Denies chest pain, chest tightness, palpitations or swelling in the hands or feet.  Neurological: Denies dizziness, difficulty with memory, difficulty with speech or problems with balance and coordination.  Psych: Denies anxiety, depression, SI/HI.  No other specific complaints in a complete review of systems (except as listed in HPI above).  PE: BP 102/80 mmHg  Pulse 83  Ht 5' 2.21" (1.58 m)  Wt 221 lb (100.245 kg)  BMI 40.16 kg/m2  SpO2 97%  Wt Readings from Last 3 Encounters:  08/04/15 221 lb (100.245 kg)  04/25/15 225 lb (102.059 kg)  04/10/15 224 lb 8 oz (101.833 kg)    General: Appears her stated age, obese in NAD. Skin: Dry and intact. Cardiovascular: Normal rate and rhythm. S1,S2 noted.  No murmur, rubs or gallops noted.  Pulmonary/Chest: Normal effort and positive vesicular breath sounds. No respiratory distress. No wheezes, rales or ronchi noted.  Neurological: Alert and oriented.  Psychiatric: Mood and affect normal. Behavior is normal. Judgment and thought content normal.    BMET    Component Value Date/Time   NA 138 11/14/2014 1847   NA 141 12/02/2013 1607   K 3.4* 11/14/2014 1847   K 3.4* 12/02/2013 1607   CL 102 11/14/2014 1847   CL 106 12/02/2013 1607   CO2 27 11/14/2014 1847   CO2 26 12/02/2013  1607   GLUCOSE 89 11/14/2014 1847   GLUCOSE 105* 12/02/2013 1607   BUN 13 11/14/2014 1847   BUN 12 12/02/2013 1607   CREATININE 0.73 11/14/2014 1847   CREATININE 0.77 12/02/2013 1607   CALCIUM 9.4 11/14/2014 1847   CALCIUM 8.6 12/02/2013 1607   GFRNONAA >60 11/14/2014 1847   GFRNONAA >60 12/02/2013 1607   GFRNONAA >60 10/16/2013 1459   GFRAA >60 11/14/2014 1847   GFRAA >60 12/02/2013 1607   GFRAA >60 10/16/2013 1459    Lipid Panel  No results found for: CHOL, TRIG, HDL, CHOLHDL, VLDL, LDLCALC  CBC    Component Value Date/Time   WBC 8.0 11/14/2014 1847   WBC 9.9 12/02/2013 1607   RBC 4.80 11/14/2014 1847   RBC 4.52 12/02/2013  1607   HGB 12.9 11/14/2014 1847   HGB 12.3 12/02/2013 1607   HCT 39.1 11/14/2014 1847   HCT 38.6 12/02/2013 1607   PLT 320 11/14/2014 1847   PLT 326 12/02/2013 1607   MCV 81.6 11/14/2014 1847   MCV 85 12/02/2013 1607   MCH 26.8 11/14/2014 1847   MCH 27.2 12/02/2013 1607   MCHC 32.9 11/14/2014 1847   MCHC 31.9* 12/02/2013 1607   RDW 14.6* 11/14/2014 1847   RDW 13.7 12/02/2013 1607   LYMPHSABS 2.0 11/05/2013 1720   LYMPHSABS 2.4 10/16/2013 1459   MONOABS 0.4 11/05/2013 1720   MONOABS 0.6 10/16/2013 1459   EOSABS 0.1 11/05/2013 1720   EOSABS 0.1 10/16/2013 1459   BASOSABS 0.0 11/05/2013 1720   BASOSABS 0.0 10/16/2013 1459    Hgb A1C No results found for: HGBA1C   Assessment and Plan:  Abnormal weight gain:  Encouraged her to pick up the exercise- add in some light weights Continue to eat healthy RX for Phentermine 37.5 mg daily  RTC in 1 month for weight check/med refill Lilac Hoff, NP   RTC in 1 month for weight check/med refill

## 2015-09-15 ENCOUNTER — Ambulatory Visit: Payer: Managed Care, Other (non HMO) | Admitting: Internal Medicine

## 2015-09-15 ENCOUNTER — Telehealth: Payer: Self-pay | Admitting: Internal Medicine

## 2015-09-15 DIAGNOSIS — Z0289 Encounter for other administrative examinations: Secondary | ICD-10-CM

## 2015-09-15 NOTE — Telephone Encounter (Signed)
If she wants to continue Phentermine, she will need follow up

## 2015-09-15 NOTE — Telephone Encounter (Signed)
Patient did not come in for their appointment today for medication follow up. Please let me know if patient needs to be contacted immediately for follow up or no follow up needed.

## 2015-09-16 NOTE — Telephone Encounter (Signed)
Called to r/s appt - mailbox full

## 2015-09-17 NOTE — Telephone Encounter (Signed)
Pt will call back to r/s appt.

## 2015-10-28 ENCOUNTER — Ambulatory Visit (INDEPENDENT_AMBULATORY_CARE_PROVIDER_SITE_OTHER): Payer: Managed Care, Other (non HMO) | Admitting: Internal Medicine

## 2015-10-28 ENCOUNTER — Encounter: Payer: Self-pay | Admitting: Internal Medicine

## 2015-10-28 VITALS — BP 106/76 | HR 76 | Temp 98.2°F | Wt 211.5 lb

## 2015-10-28 DIAGNOSIS — H6983 Other specified disorders of Eustachian tube, bilateral: Secondary | ICD-10-CM

## 2015-10-28 DIAGNOSIS — Z0289 Encounter for other administrative examinations: Secondary | ICD-10-CM

## 2015-10-28 NOTE — Patient Instructions (Signed)

## 2015-10-28 NOTE — Progress Notes (Signed)
HPI  Pt presents to the clinic today to follow up abnormal weight gain. She has a wellness form that she needs completed for insurance purpose.  She was started on Phentermine 07/2015. Her starting weight was 225, her weight today is 211, BMI of 38.42. She denied adverse side effects while on the medication but did not feel like it was effective. She never returned for her follow up appt. She is walking on a treadmill for at least 30 minutes 5 times a week. She has cut soda and red meat out of her diet. For breakfast she eats eggs whites, protein bar and fruit. For lunch she will eat something like brussel sprouts. For dinner, she will eat chicken and salad. She drinks 8+ glasses's of water daily.  She also c/o bilateral ear fullness. She denies decreased hearing or ringing in her ears. She did have some nasal congestion earlier in the week but that has improved. She has not taken anything OTC for this. She has had sick contacts.  Past Medical History:  Diagnosis Date  . Anxiety   . Cervical insufficiency in pregnancy, antepartum    prior pregnancy  . GERD (gastroesophageal reflux disease)   . Polycystic disease, ovaries     Current Outpatient Prescriptions  Medication Sig Dispense Refill  . levonorgestrel (MIRENA) 20 MCG/24HR IUD 1 each by Intrauterine route once. Inserted 10/2014    . metFORMIN (GLUCOPHAGE-XR) 500 MG 24 hr tablet Take 1 tablet by mouth daily.     No current facility-administered medications for this visit.     No Known Allergies  Family History  Problem Relation Age of Onset  . Heart disease Father   . Kidney disease Father   . Hypertension Father     Social History   Social History  . Marital status: Married    Spouse name: N/A  . Number of children: N/A  . Years of education: N/A   Occupational History  . Not on file.   Social History Main Topics  . Smoking status: Never Smoker  . Smokeless tobacco: Never Used  . Alcohol use No  . Drug use: No  .  Sexual activity: Yes    Birth control/ protection: IUD   Other Topics Concern  . Not on file   Social History Narrative  . No narrative on file    ROS:  Constitutional: Denies fever, malaise, fatigue, headache or weight gain. HEENT: Pt reports ear fullness. Denies facial pain, runny nose, nasal congestion, or sore throat.  Respiratory: Denies difficulty breathing, shortness of breath, cough or sputum production.   Cardiovascular: Denies chest pain, chest tightness, palpitations or swelling in the hands or feet.  Neurological: Denies dizziness, difficulty with memory, difficulty with speech or problems with balance and coordination.    No other specific complaints in a complete review of systems (except as listed in HPI above).  PE: BP 106/76   Pulse 76   Temp 98.2 F (36.8 C) (Oral)   Wt 211 lb 8 oz (95.9 kg)   SpO2 98%   BMI 38.42 kg/m    Wt Readings from Last 3 Encounters:  10/28/15 211 lb 8 oz (95.9 kg)  08/04/15 221 lb (100.2 kg)  04/25/15 225 lb (102.1 kg)    General: Appears her stated age, obese in NAD. Skin: Dry and intact. HEENT: Ears: TM's pine but intact, normal light reflex, + serous effusion bilaterally. Cardiovascular: Normal rate and rhythm. S1,S2 noted.  No murmur, rubs or gallops noted.  Pulmonary/Chest: Normal  effort and positive vesicular breath sounds. No respiratory distress. No wheezes, rales or ronchi noted.  Neurological: Alert and oriented.   BMET    Component Value Date/Time   NA 138 11/14/2014 1847   NA 141 12/02/2013 1607   K 3.4 (L) 11/14/2014 1847   K 3.4 (L) 12/02/2013 1607   CL 102 11/14/2014 1847   CL 106 12/02/2013 1607   CO2 27 11/14/2014 1847   CO2 26 12/02/2013 1607   GLUCOSE 89 11/14/2014 1847   GLUCOSE 105 (H) 12/02/2013 1607   BUN 13 11/14/2014 1847   BUN 12 12/02/2013 1607   CREATININE 0.73 11/14/2014 1847   CREATININE 0.77 12/02/2013 1607   CALCIUM 9.4 11/14/2014 1847   CALCIUM 8.6 12/02/2013 1607   GFRNONAA >60  11/14/2014 1847   GFRNONAA >60 12/02/2013 1607   GFRNONAA >60 10/16/2013 1459   GFRAA >60 11/14/2014 1847   GFRAA >60 12/02/2013 1607   GFRAA >60 10/16/2013 1459    Lipid Panel  No results found for: CHOL, TRIG, HDL, CHOLHDL, VLDL, LDLCALC  CBC    Component Value Date/Time   WBC 8.0 11/14/2014 1847   RBC 4.80 11/14/2014 1847   HGB 12.9 11/14/2014 1847   HGB 12.3 12/02/2013 1607   HCT 39.1 11/14/2014 1847   HCT 38.6 12/02/2013 1607   PLT 320 11/14/2014 1847   PLT 326 12/02/2013 1607   MCV 81.6 11/14/2014 1847   MCV 85 12/02/2013 1607   MCH 26.8 11/14/2014 1847   MCHC 32.9 11/14/2014 1847   RDW 14.6 (H) 11/14/2014 1847   RDW 13.7 12/02/2013 1607   LYMPHSABS 2.0 11/05/2013 1720   LYMPHSABS 2.4 10/16/2013 1459   MONOABS 0.4 11/05/2013 1720   MONOABS 0.6 10/16/2013 1459   EOSABS 0.1 11/05/2013 1720   EOSABS 0.1 10/16/2013 1459   BASOSABS 0.0 11/05/2013 1720   BASOSABS 0.0 10/16/2013 1459    Hgb A1C No results found for: HGBA1C   Assessment and Plan:  Encounter for form completion:  She has lost 22.5 lbs in the last year, almost 10% weight loss She will fax form for me to complete and fax back to her insurance company Encouraged her to pick up the exercise- add in some light weights Continue to eat healthy  ETD  Bilateral:  Advised her to try Flonase BID x 3 days then daily prn thereafter Let me know if this does not improve   Make an appt for your annual exam Nicki ReaperBAITY, Margurette Brener, NP

## 2015-10-30 ENCOUNTER — Telehealth: Payer: Self-pay | Admitting: Internal Medicine

## 2015-10-30 NOTE — Telephone Encounter (Signed)
Patient said she faxed over a wellness form from work on 10/29/15.  Patient is calling to find out if her form is ready.

## 2015-10-30 NOTE — Telephone Encounter (Signed)
I have not seen this form, have you

## 2015-11-04 NOTE — Telephone Encounter (Signed)
Form placed in your box.

## 2015-11-05 NOTE — Telephone Encounter (Signed)
Left detailed msg on VM per HIPAA Letting pt know that I have not seen form, gave fax number for back office [phone]

## 2015-11-13 ENCOUNTER — Telehealth: Payer: Self-pay | Admitting: *Deleted

## 2015-11-13 DIAGNOSIS — Z7689 Persons encountering health services in other specified circumstances: Secondary | ICD-10-CM

## 2015-11-13 NOTE — Telephone Encounter (Signed)
PT came in with a health screening form to be filled out. Please fill it out and let patient know when it is complete [phone]. Form placed in prescription tower.

## 2015-11-13 NOTE — Telephone Encounter (Signed)
Left detailed msg on VM per HIPAA Form placed in front office for pick up    

## 2015-12-06 IMAGING — US US OB LIMITED
1 series · 14 of 18 positions shown · non-contrast
Comparison: none

CLINICAL DATA: Vaginal bleeding.

EXAM:
LIMITED OBSTETRIC ULTRASOUND

[Series 1: us ob limited · 0.21mm/px · 18 acquisitions, 14 frames shown]
[im 1/18]
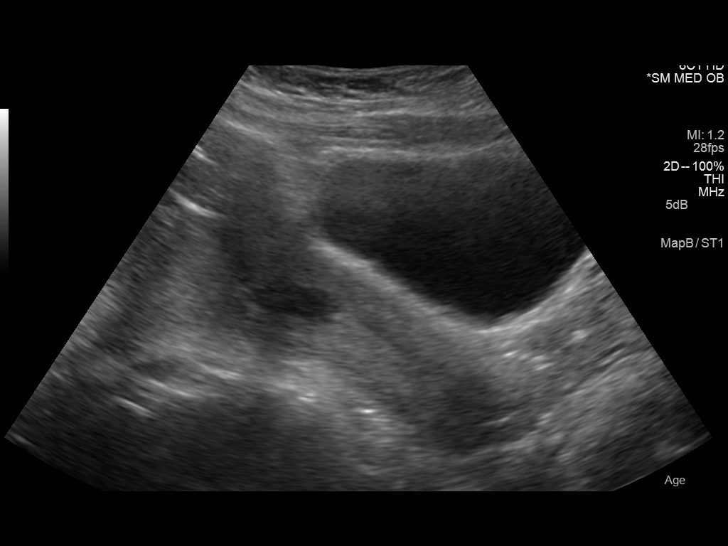
[im 2/18]
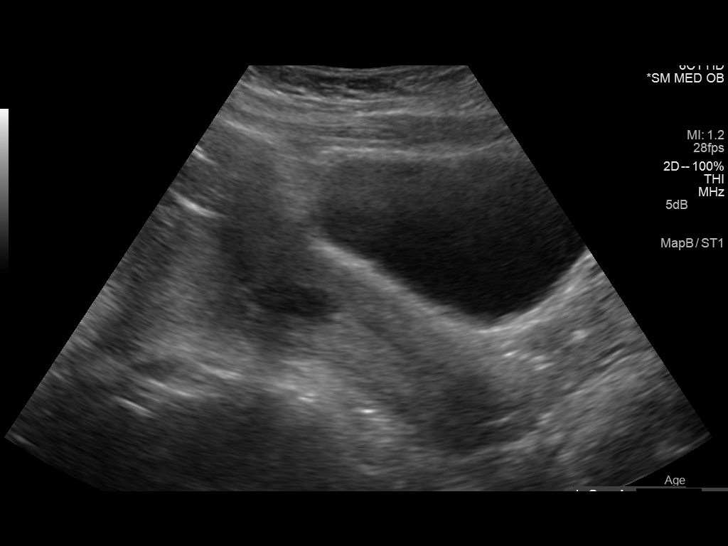
[im 4/18]
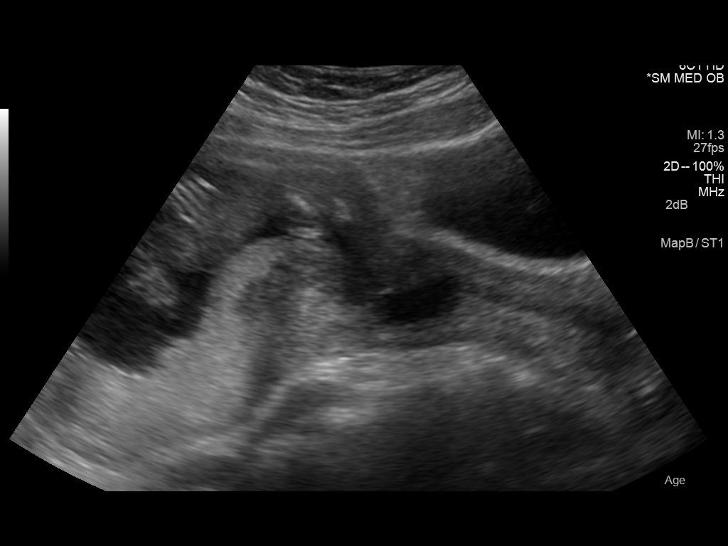
[im 5/18]
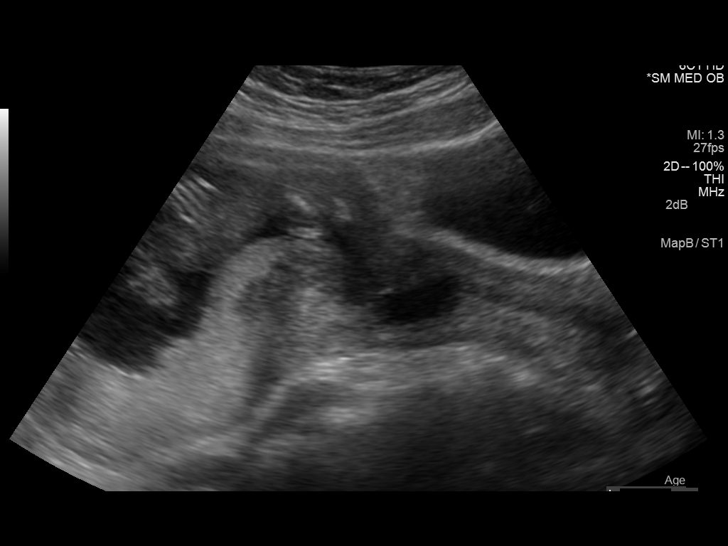
[im 6/18]
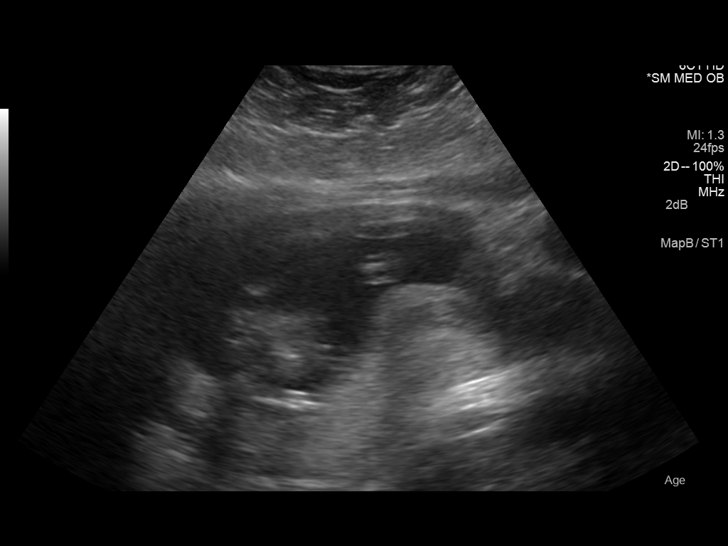
[im 8/18]
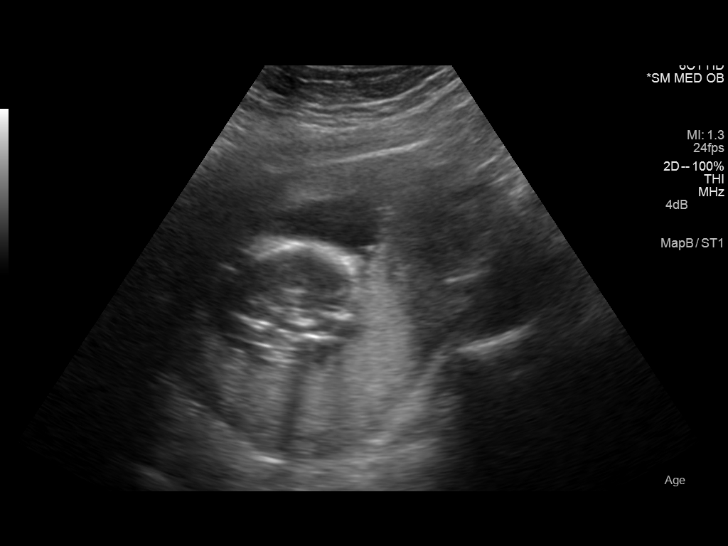
[im 9/18]
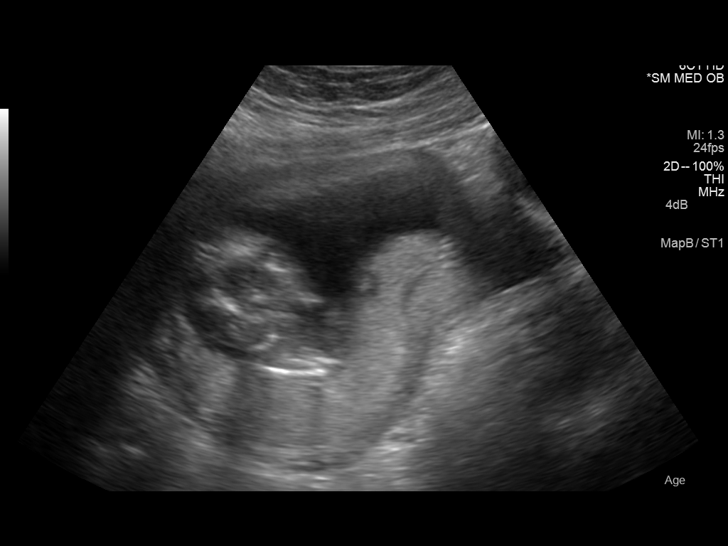
[im 10/18]
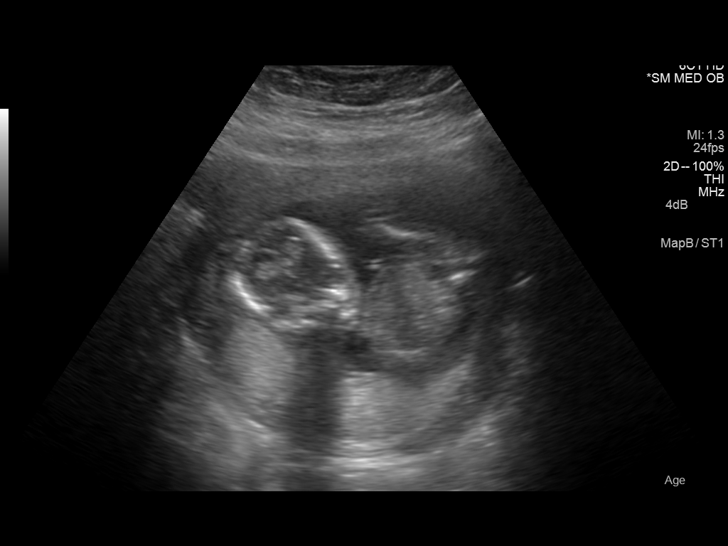
[im 11/18]
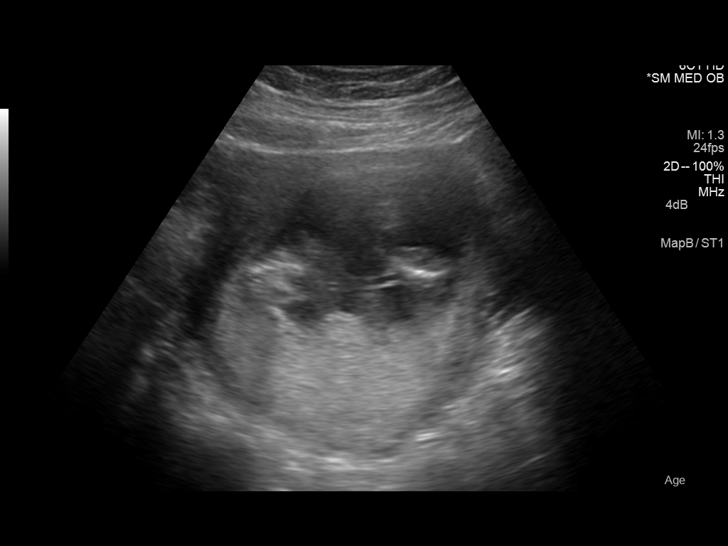
[im 13/18]
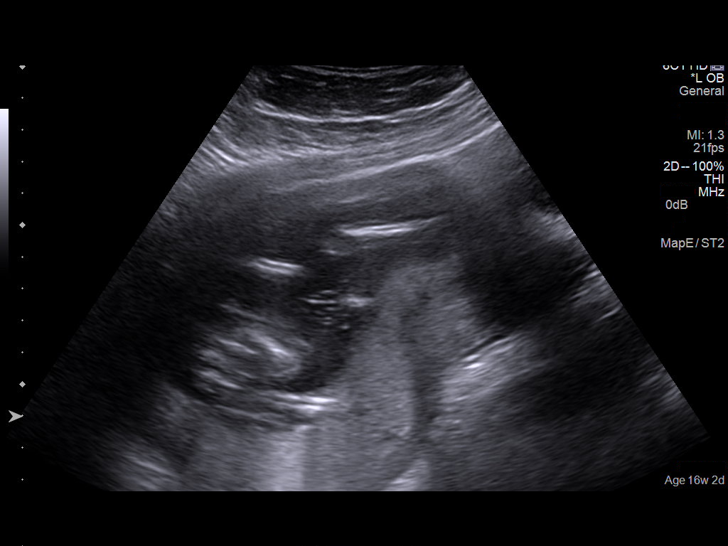
[im 14/18]
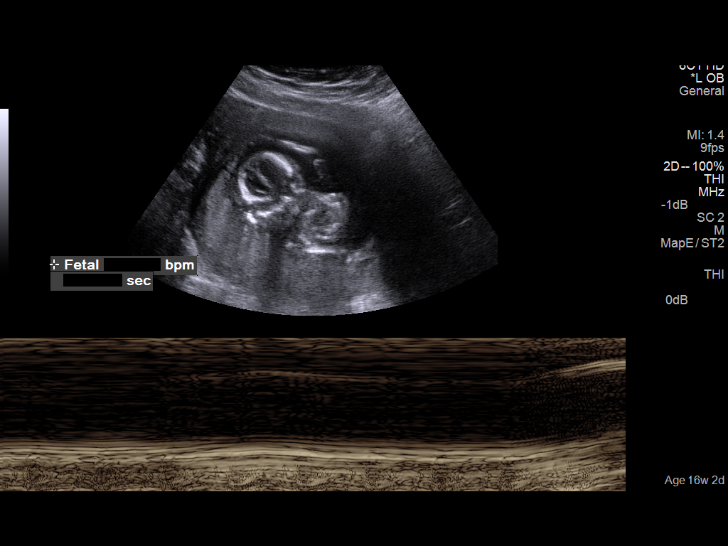
[im 15/18]
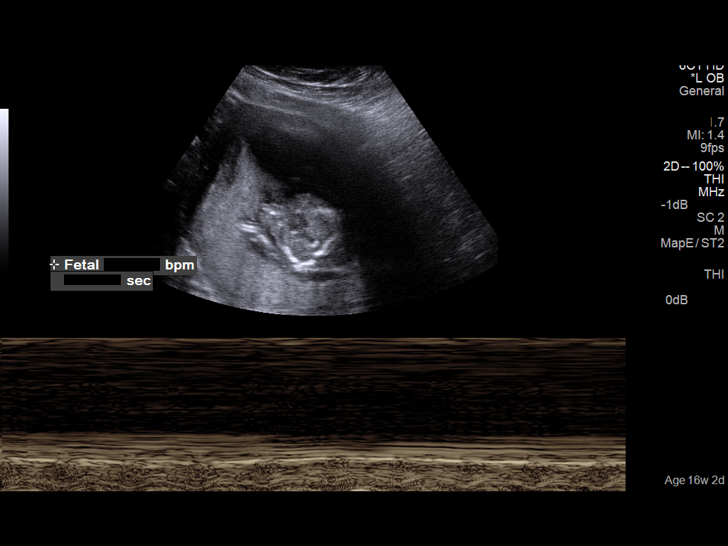
[im 17/18]
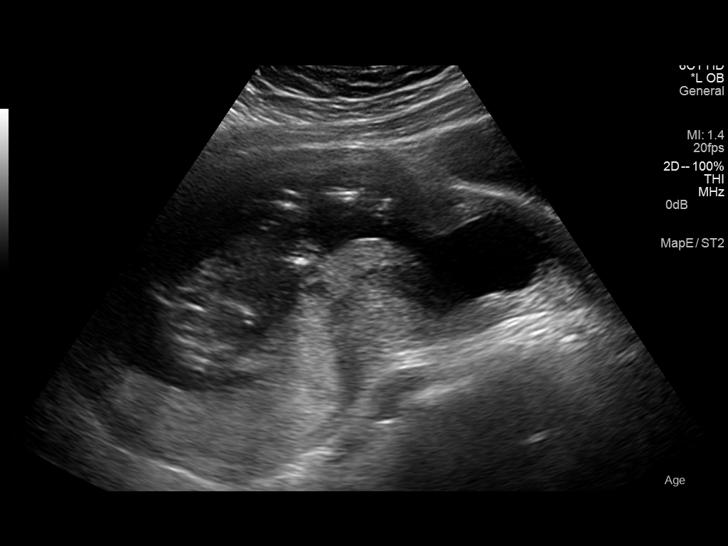
[im 18/18]
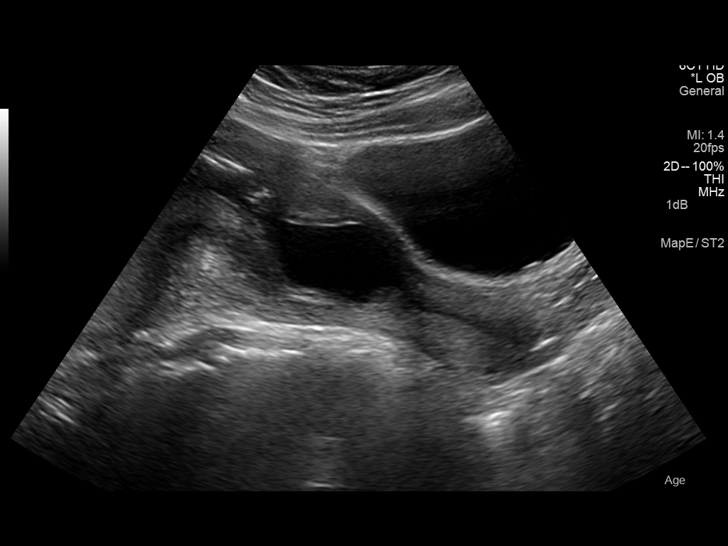

[14 of 18 positions shown; findings below may reference images not displayed]

FINDINGS: Number of Fetuses: 1

Heart Rate:  147 bpm

Movement: Yes

Presentation: Breech

Placental Location: Posterior

Previa: No

Amniotic Fluid (Subjective):  Within normal limits.

BPD:  3.4cm 16w  4d

MATERNAL FINDINGS:

Cervix:  Appears closed.

Uterus/Adnexae:  No abnormality visualized.
IMPRESSION: Single live intrauterine pregnancy.

This exam is performed on an emergent basis and does not
comprehensively evaluate fetal size, dating, or anatomy; follow-up
complete OB US should be considered if further fetal assessment is
warranted.

## 2015-12-24 ENCOUNTER — Other Ambulatory Visit: Payer: Self-pay | Admitting: Internal Medicine

## 2015-12-30 NOTE — Telephone Encounter (Signed)
Pt left v/m thinking she had available refills for phentermine. I advised pt by v/m per DPR that pt does need to schedule an appt to get further phentermine rx. And asked that pt cb for appt.

## 2016-02-23 ENCOUNTER — Ambulatory Visit: Payer: Managed Care, Other (non HMO) | Admitting: Internal Medicine

## 2016-02-24 ENCOUNTER — Ambulatory Visit (INDEPENDENT_AMBULATORY_CARE_PROVIDER_SITE_OTHER): Payer: Managed Care, Other (non HMO) | Admitting: Internal Medicine

## 2016-02-24 ENCOUNTER — Encounter: Payer: Self-pay | Admitting: Internal Medicine

## 2016-02-24 VITALS — BP 108/68 | HR 73 | Temp 98.3°F | Wt 214.0 lb

## 2016-02-24 DIAGNOSIS — R635 Abnormal weight gain: Secondary | ICD-10-CM

## 2016-02-24 MED ORDER — PHENTERMINE HCL 37.5 MG PO CAPS: 37.5000 mg | ORAL_CAPSULE | ORAL | 0 refills | Status: DC

## 2016-02-24 NOTE — Progress Notes (Signed)
Subjective:    Patient ID: Regina Chambers, female    DOB: 26-Jun-1990, 26 y.o.   MRN: [id number]  HPI  Pt presents to the clinic today to discuse weight loss. She is interested in starting back on Phentermine. She stopped in 07/2015, because she felt like she was doing really well wit hher weight loss  And she wanted to try to lose weight without it. She has lost 7 lbs since that time. Her weight today is 214 with a BMI of 38.87.  Review of Systems      Past Medical History:  Diagnosis Date  . Anxiety   . Cervical insufficiency in pregnancy, antepartum    prior pregnancy  . GERD (gastroesophageal reflux disease)   . Polycystic disease, ovaries     Current Outpatient Prescriptions  Medication Sig Dispense Refill  . levonorgestrel (MIRENA) 20 MCG/24HR IUD 1 each by Intrauterine route once. Inserted 10/2014    . metFORMIN (GLUCOPHAGE-XR) 500 MG 24 hr tablet Take 1 tablet by mouth daily.     No current facility-administered medications for this visit.     No Known Allergies  Family History  Problem Relation Age of Onset  . Heart disease Father   . Kidney disease Father   . Hypertension Father     Social History   Social History  . Marital status: Married    Spouse name: N/A  . Number of children: N/A  . Years of education: N/A   Occupational History  . Not on file.   Social History Main Topics  . Smoking status: Never Smoker  . Smokeless tobacco: Never Used  . Alcohol use No  . Drug use: No  . Sexual activity: Yes    Birth control/ protection: IUD   Other Topics Concern  . Not on file   Social History Narrative  . No narrative on file     Constitutional: Denies fever, malaise, fatigue, headache or abrupt weight changes.  Respiratory: Denies difficulty breathing, shortness of breath, cough or sputum production.   Cardiovascular: Denies chest pain, chest tightness, palpitations or swelling in the hands or feet.  Neurological: Denies dizziness,  difficulty with memory, difficulty with speech or problems with balance and coordination.    No other specific complaints in a complete review of systems (except as listed in HPI above).  Objective:   Physical Exam   BP 108/68   Pulse 73   Temp 98.3 F (36.8 C) (Oral)   Wt 214 lb (97.1 kg)   SpO2 98%   BMI 38.87 kg/m  Wt Readings from Last 3 Encounters:  02/24/16 214 lb (97.1 kg)  10/28/15 211 lb 8 oz (95.9 kg)  08/04/15 221 lb (100.2 kg)    General: Appears her stated age, obese in NAD. Cardiovascular: Normal rate and rhythm. S1,S2 noted.  No murmur, rubs or gallops noted.  Neurological: Alert and oriented.   BMET    Component Value Date/Time   NA 138 11/14/2014 1847   NA 141 12/02/2013 1607   K 3.4 (L) 11/14/2014 1847   K 3.4 (L) 12/02/2013 1607   CL 102 11/14/2014 1847   CL 106 12/02/2013 1607   CO2 27 11/14/2014 1847   CO2 26 12/02/2013 1607   GLUCOSE 89 11/14/2014 1847   GLUCOSE 105 (H) 12/02/2013 1607   BUN 13 11/14/2014 1847   BUN 12 12/02/2013 1607   CREATININE 0.73 11/14/2014 1847   CREATININE 0.77 12/02/2013 1607   CALCIUM 9.4 11/14/2014 1847   CALCIUM  8.6 12/02/2013 1607   GFRNONAA >60 11/14/2014 1847   GFRNONAA >60 12/02/2013 1607   GFRNONAA >60 10/16/2013 1459   GFRAA >60 11/14/2014 1847   GFRAA >60 12/02/2013 1607   GFRAA >60 10/16/2013 1459    Lipid Panel  No results found for: CHOL, TRIG, HDL, CHOLHDL, VLDL, LDLCALC  CBC    Component Value Date/Time   WBC 8.0 11/14/2014 1847   RBC 4.80 11/14/2014 1847   HGB 12.9 11/14/2014 1847   HGB 12.3 12/02/2013 1607   HCT 39.1 11/14/2014 1847   HCT 38.6 12/02/2013 1607   PLT 320 11/14/2014 1847   PLT 326 12/02/2013 1607   MCV 81.6 11/14/2014 1847   MCV 85 12/02/2013 1607   MCH 26.8 11/14/2014 1847   MCHC 32.9 11/14/2014 1847   RDW 14.6 (H) 11/14/2014 1847   RDW 13.7 12/02/2013 1607   LYMPHSABS 2.0 11/05/2013 1720   LYMPHSABS 2.4 10/16/2013 1459   MONOABS 0.4 11/05/2013 1720   MONOABS  0.6 10/16/2013 1459   EOSABS 0.1 11/05/2013 1720   EOSABS 0.1 10/16/2013 1459   BASOSABS 0.0 11/05/2013 1720   BASOSABS 0.0 10/16/2013 1459    Hgb A1C No results found for: HGBA1C         Assessment & Plan:   Abnormal weight gain:  Rx for Phentermine 37.5 mg daily Encouraged her to work on diet and exercise  RTC in 1 month for weigh check/med refills Nicki Reaper, NP

## 2016-02-24 NOTE — Patient Instructions (Signed)

## 2016-04-07 ENCOUNTER — Telehealth: Payer: Self-pay

## 2016-04-07 ENCOUNTER — Ambulatory Visit (INDEPENDENT_AMBULATORY_CARE_PROVIDER_SITE_OTHER): Payer: Managed Care, Other (non HMO) | Admitting: Internal Medicine

## 2016-04-07 ENCOUNTER — Encounter: Payer: Self-pay | Admitting: Internal Medicine

## 2016-04-07 VITALS — BP 108/70 | HR 99 | Temp 98.1°F | Wt 197.0 lb

## 2016-04-07 DIAGNOSIS — N760 Acute vaginitis: Secondary | ICD-10-CM

## 2016-04-07 DIAGNOSIS — R635 Abnormal weight gain: Secondary | ICD-10-CM | POA: Diagnosis not present

## 2016-04-07 MED ORDER — FLUCONAZOLE 150 MG PO TABS: 150.0000 mg | ORAL_TABLET | Freq: Once | ORAL | 0 refills | Status: AC

## 2016-04-07 MED ORDER — PHENTERMINE HCL 37.5 MG PO CAPS: 37.5000 mg | ORAL_CAPSULE | ORAL | 0 refills | Status: DC

## 2016-04-07 NOTE — Telephone Encounter (Signed)
noted 

## 2016-04-07 NOTE — Progress Notes (Signed)
Subjective:    Patient ID: Regina Chambers, female    DOB: 09/18/1990, 26 y.o.   MRN: [id number]  HPI  Pt presents to the clinic today for 1 month follow up for weight check/med refill. She was started on Phentermine 02/2016. Her starting weight was 214 lbs. Her weight today is 197 lbs with a BMI of 35.78. Breakfast: [place] sausage and egg. Lunch: Salad with chicken Dinner: Salad with chicken Snacks: nutrigrain bars, cranberries, pears Exercise: She is jogging on the treadmill for 1 hour, 3 days a week.  Additionally, she c/o vaginal discharge and itching. She reports ever since she had her IUD put in, she gets yeast infections frequently. She is requesting a RX for Diflucan.  Review of Systems      Past Medical History:  Diagnosis Date  . Anxiety   . Cervical insufficiency in pregnancy, antepartum    prior pregnancy  . GERD (gastroesophageal reflux disease)   . Polycystic disease, ovaries     Current Outpatient Prescriptions  Medication Sig Dispense Refill  . levonorgestrel (MIRENA) 20 MCG/24HR IUD 1 each by Intrauterine route once. Inserted 10/2014    . phentermine 37.5 MG capsule Take 1 capsule (37.5 mg total) by mouth every morning. 30 capsule 0   No current facility-administered medications for this visit.     No Known Allergies  Family History  Problem Relation Age of Onset  . Heart disease Father   . Kidney disease Father   . Hypertension Father     Social History   Social History  . Marital status: Married    Spouse name: N/A  . Number of children: N/A  . Years of education: N/A   Occupational History  . Not on file.   Social History Main Topics  . Smoking status: Never Smoker  . Smokeless tobacco: Never Used  . Alcohol use No  . Drug use: No  . Sexual activity: Yes    Birth control/ protection: IUD   Other Topics Concern  . Not on file   Social History Narrative  . No narrative on file     Constitutional: Denies fever, malaise, fatigue,  headache or abrupt weight changes.  Respiratory: Denies difficulty breathing, shortness of breath, cough or sputum production.   Cardiovascular: Denies chest pain, chest tightness, palpitations or swelling in the hands or feet.  Gastrointestinal: Denies abdominal pain, bloating, constipation, diarrhea or blood in the stool.  GU: Pt reports vaginal itching and discharge. Denies urgency, frequency, pain with urination, burning sensation, blood in urine, odor.   No other specific complaints in a complete review of systems (except as listed in HPI above).  Objective:   Physical Exam   BP 108/70   Pulse 99   Temp 98.1 F (36.7 C) (Oral)   Wt 197 lb (89.4 kg)   SpO2 98%   BMI 35.78 kg/m  Wt Readings from Last 3 Encounters:  04/07/16 197 lb (89.4 kg)  02/24/16 214 lb (97.1 kg)  10/28/15 211 lb 8 oz (95.9 kg)    General: Appears her stated age, obese in NAD. Cardiovascular: Normal rate and rhythm. Pulmonary/Chest: Normal effort and positive vesicular breath sounds. No respiratory distress. No wheezes, rales or ronchi noted.    BMET    Component Value Date/Time   NA 138 11/14/2014 1847   NA 141 12/02/2013 1607   K 3.4 (L) 11/14/2014 1847   K 3.4 (L) 12/02/2013 1607   CL 102 11/14/2014 1847   CL 106 12/02/2013 1607  CO2 27 11/14/2014 1847   CO2 26 12/02/2013 1607   GLUCOSE 89 11/14/2014 1847   GLUCOSE 105 (H) 12/02/2013 1607   BUN 13 11/14/2014 1847   BUN 12 12/02/2013 1607   CREATININE 0.73 11/14/2014 1847   CREATININE 0.77 12/02/2013 1607   CALCIUM 9.4 11/14/2014 1847   CALCIUM 8.6 12/02/2013 1607   GFRNONAA >60 11/14/2014 1847   GFRNONAA >60 12/02/2013 1607   GFRNONAA >60 10/16/2013 1459   GFRAA >60 11/14/2014 1847   GFRAA >60 12/02/2013 1607   GFRAA >60 10/16/2013 1459    Lipid Panel  No results found for: CHOL, TRIG, HDL, CHOLHDL, VLDL, LDLCALC  CBC    Component Value Date/Time   WBC 8.0 11/14/2014 1847   RBC 4.80 11/14/2014 1847   HGB 12.9 11/14/2014  1847   HGB 12.3 12/02/2013 1607   HCT 39.1 11/14/2014 1847   HCT 38.6 12/02/2013 1607   PLT 320 11/14/2014 1847   PLT 326 12/02/2013 1607   MCV 81.6 11/14/2014 1847   MCV 85 12/02/2013 1607   MCH 26.8 11/14/2014 1847   MCHC 32.9 11/14/2014 1847   RDW 14.6 (H) 11/14/2014 1847   RDW 13.7 12/02/2013 1607   LYMPHSABS 2.0 11/05/2013 1720   LYMPHSABS 2.4 10/16/2013 1459   MONOABS 0.4 11/05/2013 1720   MONOABS 0.6 10/16/2013 1459   EOSABS 0.1 11/05/2013 1720   EOSABS 0.1 10/16/2013 1459   BASOSABS 0.0 11/05/2013 1720   BASOSABS 0.0 10/16/2013 1459    Hgb A1C No results found for: HGBA1C         Assessment & Plan:   Abnormal Weight Gain:  Congratulated her on her weight loss Encouraged continued healthy diet and exercise regimen Phentermine refilled today  Recurrent Vaginitis:  eRx for Diflucan today  RTC in 1 month, sooner if needed Nicki ReaperBAITY, Dimple Bastyr, NP

## 2016-04-07 NOTE — Patient Instructions (Signed)

## 2016-04-07 NOTE — Telephone Encounter (Signed)
Pt request diflucan to [facility] [address]. Pt has not been taking abx but since IUD put in she has a lot of yeast infections; pt wants med called in this morning; R Baity NP does not come in today until 1PM and advised pt to continue to contact [facility] that usually orders med for pt. Pt voiced understanding. Pt said she has appt later today with R Baity NP and [facility] she will ask Pamala Hurry Baity NP for med.FYI to Pamala Hurry Baity NP.

## 2016-04-27 ENCOUNTER — Encounter: Payer: Self-pay | Admitting: Advanced Practice Midwife

## 2016-04-27 ENCOUNTER — Ambulatory Visit (INDEPENDENT_AMBULATORY_CARE_PROVIDER_SITE_OTHER): Payer: Managed Care, Other (non HMO) | Admitting: Advanced Practice Midwife

## 2016-04-27 VITALS — BP 112/70 | Ht 62.0 in | Wt 199.0 lb

## 2016-04-27 DIAGNOSIS — N3000 Acute cystitis without hematuria: Secondary | ICD-10-CM

## 2016-04-27 DIAGNOSIS — N761 Subacute and chronic vaginitis: Secondary | ICD-10-CM | POA: Diagnosis not present

## 2016-04-27 MED ORDER — FLUCONAZOLE 150 MG PO TABS: 150.0000 mg | ORAL_TABLET | Freq: Once | ORAL | 0 refills | Status: AC

## 2016-04-27 MED ORDER — CEPHALEXIN 500 MG PO CAPS: 500.0000 mg | ORAL_CAPSULE | Freq: Two times a day (BID) | ORAL | 0 refills | Status: AC

## 2016-04-27 NOTE — Progress Notes (Signed)
S: Pt is here today with c/o abdominal pain, burning with urination, increased vaginal discharge and irritation, and request for string check on IUD. She states the symptoms began 5 days ago. She admits to inadequate hydration. She states nothing has made her symptoms worse or better. She is worried if she takes antibiotics for UTI then she will develop a yeast infection. She has a history of recurrent yeast infections.   O: General: well appearing female in no apparent distress  Respiratory: no increased effort, BCTA Cardiac: Regular rate and rhythm Pelvic exam: Cervix visualized, strings are in place and 2 cm.  Minimal thin white discharge noted. Wet prep: negative for yeast, BV.   A: UTI IUD in correct place  P: Urine Culture Rx for Antibiotics for UTI Rx for Diflucan if yeast develops following antibiotics Increase hydration Decrease sugar intake to decrease risk for yeast infection   Regina Chambers, CNM

## 2016-04-30 LAB — URINE CULTURE

## 2016-06-29 ENCOUNTER — Ambulatory Visit: Payer: Managed Care, Other (non HMO) | Admitting: Internal Medicine

## 2016-06-29 DIAGNOSIS — Z0289 Encounter for other administrative examinations: Secondary | ICD-10-CM

## 2016-06-29 NOTE — Progress Notes (Deleted)
   Subjective:    Patient ID: Regina Chambers, female    DOB: [birth year], 26 y.o.   MRN: [id number]  HPI  Pt presents to the clinic today for weight check and med refill. She was started on Phentermine 02/2016. Her starting weight was 214 lbs. The phentermine has not been filled since 04/07/16. Breakfast: Lunch: Dinner: Snacks: Exercise:  Review of Systems  Past Medical History:  Diagnosis Date  . Anxiety   . Cervical insufficiency in pregnancy, antepartum    prior pregnancy  . GERD (gastroesophageal reflux disease)   . Polycystic disease, ovaries     Current Outpatient Prescriptions  Medication Sig Dispense Refill  . levonorgestrel (MIRENA) 20 MCG/24HR IUD 1 each by Intrauterine route once. Inserted 10/2014    . phentermine 37.5 MG capsule Take 1 capsule (37.5 mg total) by mouth every morning. (Patient not taking: Reported on 04/27/2016) 30 capsule 0   No current facility-administered medications for this visit.     No Known Allergies  Family History  Problem Relation Age of Onset  . Heart disease Father   . Kidney disease Father   . Hypertension Father     Social History   Social History  . Marital status: Married    Spouse name: N/A  . Number of children: N/A  . Years of education: N/A   Occupational History  . Not on file.   Social History Main Topics  . Smoking status: Never Smoker  . Smokeless tobacco: Never Used  . Alcohol use No  . Drug use: No  . Sexual activity: Yes    Birth control/ protection: IUD   Other Topics Concern  . Not on file   Social History Narrative  . No narrative on file     Constitutional: Denies fever, malaise, fatigue, headache or abrupt weight changes.  HEENT: Denies eye pain, eye redness, ear pain, ringing in the ears, wax buildup, runny nose, nasal congestion, bloody nose, or sore throat. Respiratory: Denies difficulty breathing, shortness of breath, cough or sputum production.   Cardiovascular: Denies chest pain,  chest tightness, palpitations or swelling in the hands or feet.  Gastrointestinal: Denies abdominal pain, bloating, constipation, diarrhea or blood in the stool.  GU: Denies urgency, frequency, pain with urination, burning sensation, blood in urine, odor or discharge. Musculoskeletal: Denies decrease in range of motion, difficulty with gait, muscle pain or joint pain and swelling.  Skin: Denies redness, rashes, lesions or ulcercations.  Neurological: Denies dizziness, difficulty with memory, difficulty with speech or problems with balance and coordination.  Psych: Denies anxiety, depression, SI/HI.  No other specific complaints in a complete review of systems (except as listed in HPI above).     Objective:   Physical Exam        Assessment & Plan:

## 2016-07-09 ENCOUNTER — Ambulatory Visit (INDEPENDENT_AMBULATORY_CARE_PROVIDER_SITE_OTHER): Payer: Managed Care, Other (non HMO) | Admitting: Internal Medicine

## 2016-07-09 ENCOUNTER — Encounter: Payer: Self-pay | Admitting: Internal Medicine

## 2016-07-09 VITALS — BP 112/74 | HR 84 | Temp 98.4°F | Wt 197.0 lb

## 2016-07-09 DIAGNOSIS — R635 Abnormal weight gain: Secondary | ICD-10-CM | POA: Diagnosis not present

## 2016-07-09 MED ORDER — PHENTERMINE HCL 37.5 MG PO CAPS: 37.5000 mg | ORAL_CAPSULE | ORAL | 0 refills | Status: DC

## 2016-07-09 NOTE — Progress Notes (Signed)
Subjective:    Patient ID: Regina Chambers, female    DOB: April 18, 1990, 26 y.o.   MRN: [id number]  HPI  Pt presents to the clinic today to discuss abnormal weight gain. She feels like she has gained a lot of weight over the last 3 months, although she is 197 lbs with a BMI of 36.03. She was on Phentermine 03/2016, but never returned for her follow up appt. She does have PCOS. She reports she is under a lot of stress right now, going through a separation. She would like to restart Phentermine today.  Breakfast: She does not eat breakfast Lunch: [employer] sandwich, water Dinner: snacking on crackers, pizza bites Exercise: jogging for 30-45 minutes 2 days a week.  Review of Systems  Past Medical History:  Diagnosis Date  . Anxiety   . Cervical insufficiency in pregnancy, antepartum    prior pregnancy  . GERD (gastroesophageal reflux disease)   . Polycystic disease, ovaries     Current Outpatient Prescriptions  Medication Sig Dispense Refill  . levonorgestrel (MIRENA) 20 MCG/24HR IUD 1 each by Intrauterine route once. Inserted 10/2014    . phentermine 37.5 MG capsule Take 1 capsule (37.5 mg total) by mouth every morning. (Patient not taking: Reported on 04/27/2016) 30 capsule 0   No current facility-administered medications for this visit.     No Known Allergies  Family History  Problem Relation Age of Onset  . Heart disease Father   . Kidney disease Father   . Hypertension Father     Social History   Social History  . Marital status: Married    Spouse name: N/A  . Number of children: N/A  . Years of education: N/A   Occupational History  . Not on file.   Social History Main Topics  . Smoking status: Never Smoker  . Smokeless tobacco: Never Used  . Alcohol use No  . Drug use: No  . Sexual activity: Yes    Birth control/ protection: IUD   Other Topics Concern  . Not on file   Social History Narrative  . No narrative on file     Constitutional:  Denies fever, malaise, fatigue, headache or abrupt weight changes.  Respiratory: Denies difficulty breathing, shortness of breath, cough or sputum production.   Cardiovascular: Denies chest pain, chest tightness, palpitations or swelling in the hands or feet.  Psych: Pt reports stress. Denies anxiety, depression, SI/HI.  No other specific complaints in a complete review of systems (except as listed in HPI above).     Objective:   Physical Exam  BP 112/74   Pulse 84   Temp 98.4 F (36.9 C) (Oral)   Wt 197 lb (89.4 kg)   SpO2 98%   BMI 36.03 kg/m  Wt Readings from Last 3 Encounters:  07/09/16 197 lb (89.4 kg)  04/27/16 199 lb (90.3 kg)  04/07/16 197 lb (89.4 kg)    General: Appears her stated age, obese in NAD.  Cardiovascular: Normal rate and rhythm. S1,S2 noted.  No murmur, rubs or gallops noted. Neurological: Alert and oriented.   Psychiatric: Mood and affect normal. Behavior is normal. Judgment and thought content normal.   BMET    Component Value Date/Time   NA 138 11/14/2014 1847   NA 141 12/02/2013 1607   K 3.4 (L) 11/14/2014 1847   K 3.4 (L) 12/02/2013 1607   CL 102 11/14/2014 1847   CL 106 12/02/2013 1607   CO2 27 11/14/2014 1847   CO2 26  12/02/2013 1607   GLUCOSE 89 11/14/2014 1847   GLUCOSE 105 (H) 12/02/2013 1607   BUN 13 11/14/2014 1847   BUN 12 12/02/2013 1607   CREATININE 0.73 11/14/2014 1847   CREATININE 0.77 12/02/2013 1607   CALCIUM 9.4 11/14/2014 1847   CALCIUM 8.6 12/02/2013 1607   GFRNONAA >60 11/14/2014 1847   GFRNONAA >60 12/02/2013 1607   GFRNONAA >60 10/16/2013 1459   GFRAA >60 11/14/2014 1847   GFRAA >60 12/02/2013 1607   GFRAA >60 10/16/2013 1459    Lipid Panel  No results found for: CHOL, TRIG, HDL, CHOLHDL, VLDL, LDLCALC  CBC    Component Value Date/Time   WBC 8.0 11/14/2014 1847   RBC 4.80 11/14/2014 1847   HGB 12.9 11/14/2014 1847   HGB 12.3 12/02/2013 1607   HCT 39.1 11/14/2014 1847   HCT 38.6 12/02/2013 1607   PLT 320  11/14/2014 1847   PLT 326 12/02/2013 1607   MCV 81.6 11/14/2014 1847   MCV 85 12/02/2013 1607   MCH 26.8 11/14/2014 1847   MCHC 32.9 11/14/2014 1847   RDW 14.6 (H) 11/14/2014 1847   RDW 13.7 12/02/2013 1607   LYMPHSABS 2.0 11/05/2013 1720   LYMPHSABS 2.4 10/16/2013 1459   MONOABS 0.4 11/05/2013 1720   MONOABS 0.6 10/16/2013 1459   EOSABS 0.1 11/05/2013 1720   EOSABS 0.1 10/16/2013 1459   BASOSABS 0.0 11/05/2013 1720   BASOSABS 0.0 10/16/2013 1459    Hgb A1C No results found for: HGBA1C          Assessment & Plan:   Abnormal Weight Gain:  Discussed eating breakfast, like protein shake to boost metabolism Advised her to snack on better foods, like meat and cheese, greek yogurt etc Encouraged her to continue exercise for weight loss RX for Phentermine 37.5 mg daily  RTC in 1 month for weight check/med refill Nicki ReaperBAITY, Evanie Buckle, NP

## 2016-07-09 NOTE — Patient Instructions (Signed)
Exercising to Lose Weight Exercising can help you to lose weight. In order to lose weight through exercise, you need to do vigorous-intensity exercise. You can tell that you are exercising with vigorous intensity if you are breathing very hard and fast and cannot hold a conversation while exercising. Moderate-intensity exercise helps to maintain your current weight. You can tell that you are exercising at a moderate level if you have a higher heart rate and faster breathing, but you are still able to hold a conversation. How often should I exercise? Choose an activity that you enjoy and set realistic goals. Your health care provider can help you to make an activity plan that works for you. Exercise regularly as directed by your health care provider. This may include:  Doing resistance training twice each week, such as: ? Push-ups. ? Sit-ups. ? Lifting weights. ? Using resistance bands.  Doing a given intensity of exercise for a given amount of time. Choose from these options: ? 150 minutes of moderate-intensity exercise every week. ? 75 minutes of vigorous-intensity exercise every week. ? A mix of moderate-intensity and vigorous-intensity exercise every week.  Children, pregnant women, people who are out of shape, people who are overweight, and older adults may need to consult a health care provider for individual recommendations. If you have any sort of medical condition, be sure to consult your health care provider before starting a new exercise program. What are some activities that can help me to lose weight?  Walking at a rate of at least 4.5 miles an hour.  Jogging or running at a rate of 5 miles per hour.  Biking at a rate of at least 10 miles per hour.  Lap swimming.  Roller-skating or in-line skating.  Cross-country skiing.  Vigorous competitive sports, such as football, basketball, and soccer.  Jumping rope.  Aerobic dancing. How can I be more active in my day-to-day  activities?  Use the stairs instead of the elevator.  Take a walk during your lunch break.  If you drive, park your car farther away from work or school.  If you take public transportation, get off one stop early and walk the rest of the way.  Make all of your phone calls while standing up and walking around.  Get up, stretch, and walk around every 30 minutes throughout the day. What guidelines should I follow while exercising?  Do not exercise so much that you hurt yourself, feel dizzy, or get very short of breath.  Consult your health care provider prior to starting a new exercise program.  Wear comfortable clothes and shoes with good support.  Drink plenty of water while you exercise to prevent dehydration or heat stroke. Body water is lost during exercise and must be replaced.  Work out until you breathe faster and your heart beats faster. This information is not intended to replace advice given to you by your health care provider. Make sure you discuss any questions you have with your health care provider. Document Released: 02/27/2010 Document Revised: 07/03/2015 Document Reviewed: 06/28/2013 Elsevier Interactive Patient Education  2018 Elsevier Inc.  

## 2016-07-16 ENCOUNTER — Other Ambulatory Visit: Payer: Self-pay

## 2016-07-16 MED ORDER — FLUCONAZOLE 150 MG PO TABS: 150.0000 mg | ORAL_TABLET | Freq: Once | ORAL | 0 refills | Status: AC

## 2016-07-16 NOTE — Telephone Encounter (Signed)
Patient advised.

## 2016-07-16 NOTE — Telephone Encounter (Signed)
Pt left a VM requesting a prescrIption for FLUCONAZOLE she said R Baity always prescribes this for her bc she gets a lot of yeats infections.

## 2016-07-16 NOTE — Telephone Encounter (Signed)
Sent.  F/u with PCP if recurrent.  Thanks.

## 2016-08-05 ENCOUNTER — Telehealth: Payer: Self-pay

## 2016-08-05 NOTE — Telephone Encounter (Signed)
Pt wants rx of fluconazole.  She gets regular yeast inf.  [facility] [address].  [phone]

## 2016-08-05 NOTE — Telephone Encounter (Signed)
Pt is requesting a call back about prescription.

## 2016-08-06 ENCOUNTER — Other Ambulatory Visit: Payer: Self-pay | Admitting: Advanced Practice Midwife

## 2016-08-06 DIAGNOSIS — B3731 Acute candidiasis of vulva and vagina: Secondary | ICD-10-CM

## 2016-08-06 DIAGNOSIS — B373 Candidiasis of vulva and vagina: Secondary | ICD-10-CM

## 2016-08-06 MED ORDER — FLUCONAZOLE 150 MG PO TABS: 150.0000 mg | ORAL_TABLET | Freq: Once | ORAL | 1 refills | Status: AC

## 2016-08-06 MED ORDER — FLUCONAZOLE 150 MG PO TABS: 150.0000 mg | ORAL_TABLET | Freq: Once | ORAL | 1 refills | Status: DC

## 2016-08-06 NOTE — Telephone Encounter (Signed)
Patient notified of Rx sent. 

## 2016-08-06 NOTE — Telephone Encounter (Signed)
Pt is calling to find out about her prescription. Pt report it hasn't been sent in yet.

## 2016-08-19 ENCOUNTER — Encounter: Payer: Self-pay | Admitting: Internal Medicine

## 2016-08-19 ENCOUNTER — Ambulatory Visit (INDEPENDENT_AMBULATORY_CARE_PROVIDER_SITE_OTHER): Payer: Managed Care, Other (non HMO) | Admitting: Internal Medicine

## 2016-08-19 VITALS — BP 110/70 | HR 90 | Temp 98.5°F | Wt 186.8 lb

## 2016-08-19 DIAGNOSIS — R635 Abnormal weight gain: Secondary | ICD-10-CM | POA: Diagnosis not present

## 2016-08-19 MED ORDER — PHENTERMINE HCL 37.5 MG PO CAPS: 37.5000 mg | ORAL_CAPSULE | ORAL | 0 refills | Status: DC

## 2016-08-19 NOTE — Progress Notes (Signed)
Subjective:    Patient ID: Regina Chambers, female    DOB: Apr 05, 1990, 26 y.o.   MRN: [id number]  HPI  Pt presents to the clinic today for 1 month follow up for weight check/med refill. She was restarted on Phentermine at her last visit. Her starting weight was 197 lbs. Her weight today is 186.75 with a CMI of 34.16. She has been taking the medication as prescribed. She denies adverse side effects.. She is not doing anything different than prior for her diet and exercise regimen.  Review of Systems      Past Medical History:  Diagnosis Date  . Anxiety   . Cervical insufficiency in pregnancy, antepartum    prior pregnancy  . GERD (gastroesophageal reflux disease)   . Polycystic disease, ovaries     Current Outpatient Prescriptions  Medication Sig Dispense Refill  . levonorgestrel (MIRENA) 20 MCG/24HR IUD 1 each by Intrauterine route once. Inserted 10/2014    . phentermine 37.5 MG capsule Take 1 capsule (37.5 mg total) by mouth every morning. 30 capsule 0   No current facility-administered medications for this visit.     No Known Allergies  Family History  Problem Relation Age of Onset  . Heart disease Father   . Kidney disease Father   . Hypertension Father     Social History   Social History  . Marital status: Married    Spouse name: N/A  . Number of children: N/A  . Years of education: N/A   Occupational History  . Not on file.   Social History Main Topics  . Smoking status: Never Smoker  . Smokeless tobacco: Never Used  . Alcohol use No  . Drug use: No  . Sexual activity: Yes    Birth control/ protection: IUD   Other Topics Concern  . Not on file   Social History Narrative  . No narrative on file     Constitutional: Denies fever, malaise, fatigue, headache or abrupt weight changes.   Cardiovascular: Denies chest pain, chest tightness, palpitations or swelling in the hands or feet.  Neurological: Denies dizziness, difficulty with memory,  difficulty with speech or problems with balance and coordination.    No other specific complaints in a complete review of systems (except as listed in HPI above).  Objective:   Physical Exam    BP 110/70   Pulse 90   Temp 98.5 F (36.9 C) (Oral)   Wt 186 lb 12 oz (84.7 kg)   SpO2 98%   BMI 34.16 kg/m  Wt Readings from Last 3 Encounters:  08/19/16 186 lb 12 oz (84.7 kg)  07/09/16 197 lb (89.4 kg)  04/27/16 199 lb (90.3 kg)    General: Appears her stated age, obese in NAD. Cardiovascular: Normal rate and rhythm. S1,S2 noted.  No murmur, rubs or gallops noted. Neurological: Alert and oriented.    BMET    Component Value Date/Time   NA 138 11/14/2014 1847   NA 141 12/02/2013 1607   K 3.4 (L) 11/14/2014 1847   K 3.4 (L) 12/02/2013 1607   CL 102 11/14/2014 1847   CL 106 12/02/2013 1607   CO2 27 11/14/2014 1847   CO2 26 12/02/2013 1607   GLUCOSE 89 11/14/2014 1847   GLUCOSE 105 (H) 12/02/2013 1607   BUN 13 11/14/2014 1847   BUN 12 12/02/2013 1607   CREATININE 0.73 11/14/2014 1847   CREATININE 0.77 12/02/2013 1607   CALCIUM 9.4 11/14/2014 1847   CALCIUM 8.6 12/02/2013 1607  GFRNONAA >60 11/14/2014 1847   GFRNONAA >60 12/02/2013 1607   GFRNONAA >60 10/16/2013 1459   GFRAA >60 11/14/2014 1847   GFRAA >60 12/02/2013 1607   GFRAA >60 10/16/2013 1459    Lipid Panel  No results found for: CHOL, TRIG, HDL, CHOLHDL, VLDL, LDLCALC  CBC    Component Value Date/Time   WBC 8.0 11/14/2014 1847   RBC 4.80 11/14/2014 1847   HGB 12.9 11/14/2014 1847   HGB 12.3 12/02/2013 1607   HCT 39.1 11/14/2014 1847   HCT 38.6 12/02/2013 1607   PLT 320 11/14/2014 1847   PLT 326 12/02/2013 1607   MCV 81.6 11/14/2014 1847   MCV 85 12/02/2013 1607   MCH 26.8 11/14/2014 1847   MCHC 32.9 11/14/2014 1847   RDW 14.6 (H) 11/14/2014 1847   RDW 13.7 12/02/2013 1607   LYMPHSABS 2.0 11/05/2013 1720   LYMPHSABS 2.4 10/16/2013 1459   MONOABS 0.4 11/05/2013 1720   MONOABS 0.6 10/16/2013  1459   EOSABS 0.1 11/05/2013 1720   EOSABS 0.1 10/16/2013 1459   BASOSABS 0.0 11/05/2013 1720   BASOSABS 0.0 10/16/2013 1459    Hgb A1C No results found for: HGBA1C        Assessment & Plan:   Abnormal Weight Gain:  Encouraged her to consume a low carb, high protein diet Get at least 150 minutes of exercise each week Phentermine refilled today  RTC in 2 months for follow up for weight gain/med refill Regina Reaper, NP

## 2016-08-19 NOTE — Patient Instructions (Signed)

## 2016-10-07 ENCOUNTER — Other Ambulatory Visit: Payer: Self-pay

## 2016-10-07 DIAGNOSIS — B373 Candidiasis of vulva and vagina: Secondary | ICD-10-CM

## 2016-10-07 DIAGNOSIS — B3731 Acute candidiasis of vulva and vagina: Secondary | ICD-10-CM

## 2016-10-07 NOTE — Telephone Encounter (Signed)
Pt left v/m requesting rx phentermine (last printed # 30 on 08/19/16)and fluconazole (last refilled # 1 x 1 On 08/06/16 by Tresea MallJane Gledhill CNM. Unable to reach pt for more info about need for fluconazole. Pt last seen 08/19/16 and to return for appt in 2 months. No future appt scheduled.Please advise.

## 2016-10-07 NOTE — Telephone Encounter (Signed)
If she wants RX for Phentermine, she is going to have to make an appt. Diflucan also is not refilled without an appt

## 2016-10-08 ENCOUNTER — Other Ambulatory Visit: Payer: Self-pay

## 2016-10-08 ENCOUNTER — Telehealth: Payer: Self-pay

## 2016-10-08 MED ORDER — PHENTERMINE HCL 37.5 MG PO CAPS: 37.5000 mg | ORAL_CAPSULE | ORAL | 0 refills | Status: DC

## 2016-10-08 NOTE — Telephone Encounter (Signed)
error 

## 2016-10-08 NOTE — Telephone Encounter (Signed)
Pt calling for refill of fluconozole as she is prone to yeast inf.  [facility] [address].  [phone]

## 2016-10-08 NOTE — Telephone Encounter (Signed)
Left detailed msg on VM per HIPAA  

## 2016-10-08 NOTE — Telephone Encounter (Deleted)
Verbal order given to call in phentermine as pt was supposed to have refill at last OV.... Pt told me she was sure that she had received a refill for Diflucan even recently from this office without being see... I let pt know that the last refill was from GYN with 1 addit refill, that was not from our office... Pt proceeded to say that she did not understand why she needed an OV... I explained that she needs to be evaluated before treatment to ensure correct Dx and treatment... She again stated she did not understand and she will just call GYN office for refills

## 2016-10-08 NOTE — Telephone Encounter (Signed)
Pt returned my call.Regina Chambers.Regina Chambers.Verbal order given to call in phentermine as pt was supposed to have refill at last OV.... Pt told me she was sure that she had received a refill for Diflucan even recently from this office without being see... I let pt know that the last refill was from GYN with 1 addit refill, that was not from our office... Pt proceeded to say that she did not understand why she needed an OV... I explained that she needs to be evaluated before treatment to ensure correct Dx and treatment... She again stated she did not understand and she will just call GYN office for refills

## 2016-10-12 ENCOUNTER — Other Ambulatory Visit: Payer: Self-pay | Admitting: Advanced Practice Midwife

## 2016-10-12 DIAGNOSIS — B373 Candidiasis of vulva and vagina: Secondary | ICD-10-CM

## 2016-10-12 DIAGNOSIS — B3731 Acute candidiasis of vulva and vagina: Secondary | ICD-10-CM

## 2016-10-12 MED ORDER — FLUCONAZOLE 150 MG PO TABS: 150.0000 mg | ORAL_TABLET | ORAL | 5 refills | Status: AC

## 2016-10-27 ENCOUNTER — Ambulatory Visit (INDEPENDENT_AMBULATORY_CARE_PROVIDER_SITE_OTHER): Payer: Managed Care, Other (non HMO) | Admitting: Obstetrics and Gynecology

## 2016-10-27 ENCOUNTER — Encounter: Payer: Self-pay | Admitting: Obstetrics and Gynecology

## 2016-10-27 VITALS — BP 106/78 | HR 100 | Ht 62.0 in | Wt 188.0 lb

## 2016-10-27 DIAGNOSIS — B373 Candidiasis of vulva and vagina: Secondary | ICD-10-CM | POA: Diagnosis not present

## 2016-10-27 DIAGNOSIS — Z113 Encounter for screening for infections with a predominantly sexual mode of transmission: Secondary | ICD-10-CM

## 2016-10-27 DIAGNOSIS — B3731 Acute candidiasis of vulva and vagina: Secondary | ICD-10-CM

## 2016-10-27 LAB — POCT WET PREP WITH KOH
Clue Cells Wet Prep HPF POC: NEGATIVE
KOH PREP POC: NEGATIVE
Trichomonas, UA: NEGATIVE
YEAST WET PREP PER HPF POC: NEGATIVE

## 2016-10-27 MED ORDER — TERCONAZOLE 0.4 % VA CREA: 1.0000 | TOPICAL_CREAM | Freq: Every day | VAGINAL | 0 refills | Status: DC

## 2016-10-27 NOTE — Progress Notes (Signed)
Chief Complaint  Patient presents with  . Vaginitis    HPI:      Ms. Regina Chambers is a 26 y.o. G2P1011 who LMP was No LMP recorded. Patient is not currently having periods (Reason: IUD)., presents today for recurrent yeast vag sx. She noticed increased d/c, irritation/burning, no odor a couple wks ago and treated with 2 diflucan with sx relief, but sx have recurred again. She was on abx 3/18 for a UTI. She is sex active with a new partner. She uses dove sens skin soap/no dryer sheets. No LBP, belly pain, fevers. No probiotic use.   Past Medical History:  Diagnosis Date  . Anxiety   . Cervical insufficiency in pregnancy, antepartum    prior pregnancy  . GERD (gastroesophageal reflux disease)   . Polycystic disease, ovaries     Past Surgical History:  Procedure Laterality Date  . CERVICAL CERCLAGE  2016  . CESAREAN SECTION N/A 09/02/2014   Procedure: CESAREAN SECTION;  Surgeon: [name], MD;  Location: [facility] ORS;  Service: Obstetrics;  Laterality: N/A;  . CHOLECYSTECTOMY N/A 05/02/2015   Procedure: LAPAROSCOPIC CHOLECYSTECTOMY WITH INTRAOPERATIVE CHOLANGIOGRAM;  Surgeon: Nadeen Landau, MD;  Location: [facility] ORS;  Service: General;  Laterality: N/A;    Family History  Problem Relation Age of Onset  . Heart disease Father   . Kidney disease Father   . Hypertension Father     Social History   Social History  . Marital status: Married    Spouse name: N/A  . Number of children: N/A  . Years of education: N/A   Occupational History  . Not on file.   Social History Main Topics  . Smoking status: Never Smoker  . Smokeless tobacco: Never Used  . Alcohol use No  . Drug use: No  . Sexual activity: Yes    Birth control/ protection: IUD   Other Topics Concern  . Not on file   Social History Narrative  . No narrative on file     Current Outpatient Prescriptions:  .  sertraline (ZOLOFT) 50 MG tablet, 1/2 tab daily for 2 weeks and then increase to 1  tab, Disp: , Rfl:  .  fluconazole (DIFLUCAN) 150 MG tablet, Take 1 tablet (150 mg total) by mouth once a week., Disp: 4 tablet, Rfl: 5 .  levonorgestrel (MIRENA) 20 MCG/24HR IUD, 1 each by Intrauterine route once. Inserted 10/2014, Disp: , Rfl:  .  phentermine 37.5 MG capsule, Take 1 capsule (37.5 mg total) by mouth every morning., Disp: 30 capsule, Rfl: 0 .  terconazole (TERAZOL 7) 0.4 % vaginal cream, Place 1 applicator vaginally at bedtime., Disp: 45 g, Rfl: 0   ROS:  Review of Systems  Constitutional: Negative for fever.  Gastrointestinal: Negative for blood in stool, constipation, diarrhea, nausea and vomiting.  Genitourinary: Positive for vaginal discharge. Negative for dyspareunia, dysuria, flank pain, frequency, hematuria, urgency, vaginal bleeding and vaginal pain.  Musculoskeletal: Negative for back pain.  Skin: Negative for rash.     OBJECTIVE:   Vitals:  BP 106/78   Pulse 100   Ht  (1.575 m)   Wt 188 lb (85.3 kg)   BMI 34.39 kg/m   Physical Exam  Constitutional: She is oriented to person, place, and time and well-developed, well-nourished, and in no distress. Vital signs are normal.  Genitourinary: Uterus normal, cervix normal, right adnexa normal, left adnexa normal and vulva normal. Uterus is not enlarged. Cervix exhibits no motion tenderness and no tenderness. Right  adnexum displays no mass and no tenderness. Left adnexum displays no mass and no tenderness. Vulva exhibits no erythema, no exudate, no lesion, no rash and no tenderness. Vagina exhibits no lesion. Thick  white and vaginal discharge found.  Neurological: She is oriented to person, place, and time.  Vitals reviewed.   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 Recurrent sx. Question fully treated with diflucan. Rx terazol. F/u prn. Will do OneSwab culture if sx persist. add probiotics.  - Plan: POCT Wet Prep with KOH, terconazole (TERAZOL 7) 0.4 % vaginal cream  Screening for STD (sexually transmitted disease) - Plan: Chlamydia/Gonococcus/Trichomonas, NAA    Return if symptoms worsen or fail to improve.  Alicia B. Copland, PA-C 10/27/2016 11:15 AM

## 2016-10-29 LAB — CHLAMYDIA/GONOCOCCUS/TRICHOMONAS, NAA
Chlamydia by NAA: NEGATIVE
Gonococcus by NAA: NEGATIVE
TRICH VAG BY NAA: NEGATIVE

## 2016-12-02 ENCOUNTER — Telehealth: Payer: Self-pay

## 2016-12-02 NOTE — Telephone Encounter (Signed)
Pt called triage line stating she has a UTI. She would like a medication called in to [facility]. CB# [phone]  Huntley DecSara, please call her and get her scheduled at first available slot. Thank you KJ CMA

## 2016-12-02 NOTE — Telephone Encounter (Signed)
Called to schedule appt for patient. vm box is full unable to leave message

## 2016-12-03 ENCOUNTER — Ambulatory Visit: Payer: Managed Care, Other (non HMO) | Admitting: Primary Care

## 2016-12-03 ENCOUNTER — Ambulatory Visit: Payer: Managed Care, Other (non HMO) | Admitting: Maternal Newborn

## 2016-12-03 DIAGNOSIS — Z0289 Encounter for other administrative examinations: Secondary | ICD-10-CM

## 2016-12-03 NOTE — Telephone Encounter (Signed)
Pt states she did go to urgent care yesterday & was rx'd Cipro for UTI. Pt isn't able to hold urine at all. Would like call back to see if this is normal. Cb# [phone].

## 2016-12-03 NOTE — Telephone Encounter (Signed)
Spoke w/pt. Inquired if she had any incontinence issues prior to her UTI. She states she did not. Advised that likely d/t UTI. Once treated it should subside. If not, pt can schedule apt for evaluation.

## 2016-12-03 NOTE — Telephone Encounter (Signed)
Pt is schedule 12/03/16

## 2016-12-07 ENCOUNTER — Encounter: Payer: Self-pay | Admitting: Advanced Practice Midwife

## 2016-12-07 ENCOUNTER — Ambulatory Visit (INDEPENDENT_AMBULATORY_CARE_PROVIDER_SITE_OTHER): Payer: Managed Care, Other (non HMO) | Admitting: Advanced Practice Midwife

## 2016-12-07 VITALS — BP 110/70 | HR 79 | Ht 62.0 in | Wt 190.0 lb

## 2016-12-07 DIAGNOSIS — B379 Candidiasis, unspecified: Secondary | ICD-10-CM

## 2016-12-07 DIAGNOSIS — B9689 Other specified bacterial agents as the cause of diseases classified elsewhere: Secondary | ICD-10-CM | POA: Diagnosis not present

## 2016-12-07 DIAGNOSIS — N760 Acute vaginitis: Secondary | ICD-10-CM | POA: Diagnosis not present

## 2016-12-07 MED ORDER — METRONIDAZOLE 500 MG PO TABS: 500.0000 mg | ORAL_TABLET | Freq: Two times a day (BID) | ORAL | 0 refills | Status: AC

## 2016-12-07 MED ORDER — FLUCONAZOLE 150 MG PO TABS: 150.0000 mg | ORAL_TABLET | Freq: Once | ORAL | 1 refills | Status: AC

## 2016-12-07 NOTE — Progress Notes (Signed)
S: The patient is here today with complaint of vaginal discharge and odor for 1 week after having intercourse. She describes the discharge as thin and yellow. She was treated with cipro for UTI a week ago. She denies itching or irritation. There are no other aggravating or alleviating factors. She has IUD for birth control. She was recently tested for STDs with no problems found. Discussion of OTC treatments/comfort measures. Discussion of possibility of yeast infection following treatment for BV and given that she has recently been treated with antibiotics for UTI.  O: Vital Signs: BP 110/70   Pulse 79   Ht 5\' 2"  (1.575 m)   Wt 190 lb (86.2 kg)   BMI 34.75 kg/m  Constitutional: Well nourished, well developed female in no acute distress.  HEENT: normal Skin: Warm and dry.  Cardiovascular: Regular rate and rhythm.    Respiratory: Clear to auscultation bilateral. Normal respiratory effort Abdomen: soft, nontender, nondistended, no abnormal masses, no epigastric pain Back: no CVAT Psych: Alert and Oriented x3. No memory deficits. Normal mood and affect.  MS: normal gait, normal bilateral lower extremity ROM/strength/stability.  Pelvic exam:  is not limited by body habitus EGBUS: within normal limits Vagina: within normal limits and with normal mucosa, scant thick, tan discharge, no odor noted Cervix: IUD strings seen  Wet prep: Positive for clue cells, negative whiff, negative yeast  A: 26 yo female with Bacterial Vaginosis  P: Rx for Metronidazole Rx for Diflucan Sea Salt bath OTC boric acid Cotton underwear Probiotics Hygiene following intercourse  Tresea MallJane Nashae Maudlin, CNM

## 2016-12-28 ENCOUNTER — Telehealth: Payer: Self-pay | Admitting: Internal Medicine

## 2016-12-28 NOTE — Telephone Encounter (Signed)
Copied from CRM #[id number]. Topic: Quick Communication - See Telephone Encounter >> Dec 28, 2016  2:05 PM Louie BunPalacios Medina, Rosey Batheresa D wrote: CRM for notification. See Telephone encounter for: 12/28/16. [facility] or her CMA about weight lost. Please call patient back, thanks.

## 2016-12-29 NOTE — Telephone Encounter (Signed)
Left detailed msg on VM per HIPAA  

## 2017-01-04 ENCOUNTER — Telehealth: Payer: Self-pay | Admitting: *Deleted

## 2017-01-04 NOTE — Telephone Encounter (Signed)
Copied from CRM #[id number]. Topic: Quick Communication - See Telephone Encounter >> Dec 28, 2016  2:05 PM Louie BunPalacios Medina, Rosey Batheresa D wrote: CRM for notification. See Telephone encounter for: 12/28/16. Patient [facility] or her CMA about weight lost. Please call patient back, thanks. >> Jan 04, 2017 11:58 AM Arlyss Gandyichardson, Taren N, NT wrote: Patient would like to have a call back regarding the above message. Was unable to speak with Dawna Partegina Baitys nurse last week.

## 2017-01-06 NOTE — Telephone Encounter (Signed)
I scheduled pt an appt for Tues at 11am to discuss weight mgmt, [facility] and they told her to schedule an appt with her PCP first... Please advise if this is appropriate

## 2017-01-07 NOTE — Telephone Encounter (Signed)
This is fine, we will discuss at the appt.

## 2017-01-11 ENCOUNTER — Encounter: Payer: Self-pay | Admitting: Internal Medicine

## 2017-01-11 ENCOUNTER — Ambulatory Visit: Payer: Managed Care, Other (non HMO) | Admitting: Internal Medicine

## 2017-01-11 VITALS — BP 118/78 | HR 96 | Temp 98.4°F | Wt 191.0 lb

## 2017-01-11 DIAGNOSIS — R635 Abnormal weight gain: Secondary | ICD-10-CM

## 2017-01-11 NOTE — Progress Notes (Signed)
Subjective:    Patient ID: Regina Chambers, female    DOB: February 10, 1990, 26 y.o.   MRN: [id number]  HPI  Pt presents to the clinic today to discuss her weight loss options. She visited [facility] and they advised her to see me for a note stating that she has been unable to lose weight on her own. She has tried to lose weight in the past with the use of Phentermine. She was able to lose 48 lbs with the use of Phentermine, diet and exercise. Since that time, she has plateued and has not been able to lose any additional weight. Her BMI is 34.93.  Breakfast: Egg and NutriGrain Bar Lunch: 1/2 sandwich, soups Dinner: Chicken, a starch, and veggies Snacks: peanuts, mixed dry fruit, yogurt  Exercise: She is doing cardio for 30 minutes daily, 3-4 days per week   Review of Systems      Past Medical History:  Diagnosis Date  . Anxiety   . Cervical insufficiency in pregnancy, antepartum    prior pregnancy  . GERD (gastroesophageal reflux disease)   . Polycystic disease, ovaries     Current Outpatient Medications  Medication Sig Dispense Refill  . levonorgestrel (MIRENA) 20 MCG/24HR IUD 1 each by Intrauterine route once. Inserted 10/2014    . sertraline (ZOLOFT) 50 MG tablet 1/2 tab daily for 2 weeks and then increase to 1 tab     No current facility-administered medications for this visit.     No Known Allergies  Family History  Problem Relation Age of Onset  . Heart disease Father   . Kidney disease Father   . Hypertension Father     Social History   Socioeconomic History  . Marital status: Married    Spouse name: Not on file  . Number of children: Not on file  . Years of education: Not on file  . Highest education level: Not on file  Social Needs  . Financial resource strain: Not on file  . Food insecurity - worry: Not on file  . Food insecurity - inability: Not on file  . Transportation needs - medical: Not on file  . Transportation needs -  non-medical: Not on file  Occupational History  . Not on file  Tobacco Use  . Smoking status: Never Smoker  . Smokeless tobacco: Never Used  Substance and Sexual Activity  . Alcohol use: No  . Drug use: No  . Sexual activity: Yes    Birth control/protection: IUD  Other Topics Concern  . Not on file  Social History Narrative  . Not on file     Constitutional: Pt reports weight gain. Denies fever, malaise, fatigue, headache.   No other specific complaints in a complete review of systems (except as listed in HPI above).  Objective:   Physical Exam   BP 118/78   Pulse 96   Temp 98.4 F (36.9 C) (Oral)   Wt 191 lb (86.6 kg)   SpO2 98%   BMI 34.93 kg/m  Wt Readings from Last 3 Encounters:  01/11/17 191 lb (86.6 kg)  12/07/16 190 lb (86.2 kg)  10/27/16 188 lb (85.3 kg)    General: Appears her stated age, obese in NAD. Cardiovascular: Normal rate and rhythm. S1,S2 noted.  No murmur, rubs or gallops noted.  Pulmonary/Chest: Normal effort and positive vesicular breath sounds. No respiratory distress. No wheezes, rales or ronchi noted.  Neurological: Alert and oriented.  Psychiatric: Mood and affect normal. Behavior is normal. Judgment and  thought content normal.    BMET    Component Value Date/Time   NA 138 11/14/2014 1847   NA 141 12/02/2013 1607   K 3.4 (L) 11/14/2014 1847   K 3.4 (L) 12/02/2013 1607   CL 102 11/14/2014 1847   CL 106 12/02/2013 1607   CO2 27 11/14/2014 1847   CO2 26 12/02/2013 1607   GLUCOSE 89 11/14/2014 1847   GLUCOSE 105 (H) 12/02/2013 1607   BUN 13 11/14/2014 1847   BUN 12 12/02/2013 1607   CREATININE 0.73 11/14/2014 1847   CREATININE 0.77 12/02/2013 1607   CALCIUM 9.4 11/14/2014 1847   CALCIUM 8.6 12/02/2013 1607   GFRNONAA >60 11/14/2014 1847   GFRNONAA >60 12/02/2013 1607   GFRNONAA >60 10/16/2013 1459   GFRAA >60 11/14/2014 1847   GFRAA >60 12/02/2013 1607   GFRAA >60 10/16/2013 1459    Lipid Panel  No results found for:  CHOL, TRIG, HDL, CHOLHDL, VLDL, LDLCALC  CBC    Component Value Date/Time   WBC 8.0 11/14/2014 1847   RBC 4.80 11/14/2014 1847   HGB 12.9 11/14/2014 1847   HGB 12.3 12/02/2013 1607   HCT 39.1 11/14/2014 1847   HCT 38.6 12/02/2013 1607   PLT 320 11/14/2014 1847   PLT 326 12/02/2013 1607   MCV 81.6 11/14/2014 1847   MCV 85 12/02/2013 1607   MCH 26.8 11/14/2014 1847   MCHC 32.9 11/14/2014 1847   RDW 14.6 (H) 11/14/2014 1847   RDW 13.7 12/02/2013 1607   LYMPHSABS 2.0 11/05/2013 1720   LYMPHSABS 2.4 10/16/2013 1459   MONOABS 0.4 11/05/2013 1720   MONOABS 0.6 10/16/2013 1459   EOSABS 0.1 11/05/2013 1720   EOSABS 0.1 10/16/2013 1459   BASOSABS 0.0 11/05/2013 1720   BASOSABS 0.0 10/16/2013 1459    Hgb A1C No results found for: HGBA1C         Assessment & Plan:   Abnormal Weight Gain:  She initially lost weight but now has been unable to lose any weight over the last 2 months, now starting to gain weight back. I think she would benefit from the gastric sleeve.  Return precautions discussed Nicki ReaperBAITY, Nalla Purdy, NP

## 2017-01-12 ENCOUNTER — Encounter: Payer: Self-pay | Admitting: Internal Medicine

## 2017-01-12 NOTE — Patient Instructions (Signed)

## 2017-02-04 ENCOUNTER — Telehealth: Payer: Self-pay | Admitting: Internal Medicine

## 2017-02-04 NOTE — Telephone Encounter (Signed)
She has not been losing weight with Phentermine over the last few months. I will not refill it. Has she called [facility]?

## 2017-02-04 NOTE — Telephone Encounter (Signed)
Copied from CRM [phone]. Topic: Quick Communication - Rx Refill/Question >> Feb 04, 2017  1:09 PM Alexander BergeronBarksdale, Harvey B wrote: Pt called b/c pt wants to go back on the Rx for phentermine 37.5 MG capsule [[id number]][[id number]] pt states they had appt 2 wks ago about weight loss, contact pt to advise

## 2017-02-07 NOTE — Telephone Encounter (Signed)
Patient checking status, requesting call back [phone]

## 2017-02-09 NOTE — Telephone Encounter (Signed)
It has not been effective. She needs to consider her other options.

## 2017-02-09 NOTE — Telephone Encounter (Signed)
Lm on pts vm requesting a call back. Ok to [employer] RBaity's message should pt return call

## 2017-02-09 NOTE — Telephone Encounter (Signed)
Left detailed msg on VM per HIPAA  

## 2017-02-09 NOTE — Telephone Encounter (Signed)
Pt did call [facility] and has decided to not go with the surgery and was wanting to give Phentermine another try.

## 2017-02-25 ENCOUNTER — Encounter: Payer: Self-pay | Admitting: Obstetrics and Gynecology

## 2017-02-25 ENCOUNTER — Ambulatory Visit (INDEPENDENT_AMBULATORY_CARE_PROVIDER_SITE_OTHER): Payer: Managed Care, Other (non HMO) | Admitting: Obstetrics and Gynecology

## 2017-02-25 VITALS — BP 118/62 | HR 81 | Ht 62.0 in | Wt 200.0 lb

## 2017-02-25 DIAGNOSIS — Z6836 Body mass index (BMI) 36.0-36.9, adult: Secondary | ICD-10-CM

## 2017-02-25 DIAGNOSIS — E669 Obesity, unspecified: Secondary | ICD-10-CM

## 2017-02-25 MED ORDER — PHENTERMINE HCL 37.5 MG PO TABS: 37.5000 mg | ORAL_TABLET | Freq: Every day | ORAL | 0 refills | Status: DC

## 2017-02-25 NOTE — Progress Notes (Signed)
Gynecology Office Visit  Chief Complaint:  Chief Complaint  Patient presents with  . Weight consult    History of Present Illness: Patientis a 27 y.o. G12P1011 female, who presents for the evaluation of weight gain. She has 18lbs over the last 2 years but is interested in options for helping her loose further weight. The patient states the following issues have contributed to her weight problem: none identified. The patient has no additional symptoms. The patient specifically denies memory loss, muscle weakness, excessive thirst, and polyuria. Weight related co-morbidities include none. . She has tried medical weight loss (phentermine) interventions in the past with moderate success.   Review of Systems: 10 point review of systems negative unless otherwise noted in HPI  Past Medical History:  Past Medical History:  Diagnosis Date  . Anxiety   . Cervical insufficiency in pregnancy, antepartum    prior pregnancy  . GERD (gastroesophageal reflux disease)   . Polycystic disease, ovaries     Past Surgical History:  Past Surgical History:  Procedure Laterality Date  . CERVICAL CERCLAGE  2016  . CESAREAN SECTION N/A 09/02/2014   Procedure: CESAREAN SECTION;  Surgeon: [name], MD;  Location: [facility] ORS;  Service: Obstetrics;  Laterality: N/A;  . CHOLECYSTECTOMY N/A 05/02/2015   Procedure: LAPAROSCOPIC CHOLECYSTECTOMY WITH INTRAOPERATIVE CHOLANGIOGRAM;  Surgeon: Nadeen Landau, MD;  Location: [facility] ORS;  Service: General;  Laterality: N/A;    Gynecologic History: No LMP recorded. Patient is not currently having periods (Reason: IUD).  Obstetric History: G2P1011  Family History:  Family History  Problem Relation Age of Onset  . Heart disease Father   . Kidney disease Father   . Hypertension Father     Social History:  Social History   Socioeconomic History  . Marital status: Married    Spouse name: Not on file  . Number of children: Not on file  . Years of  education: Not on file  . Highest education level: Not on file  Social Needs  . Financial resource strain: Not on file  . Food insecurity - worry: Not on file  . Food insecurity - inability: Not on file  . Transportation needs - medical: Not on file  . Transportation needs - non-medical: Not on file  Occupational History  . Not on file  Tobacco Use  . Smoking status: Never Smoker  . Smokeless tobacco: Never Used  Substance and Sexual Activity  . Alcohol use: No  . Drug use: No  . Sexual activity: Yes    Birth control/protection: IUD  Other Topics Concern  . Not on file  Social History Narrative  . Not on file    Allergies:  No Known Allergies  Medications: Prior to Admission medications   Medication Sig Start Date End Date Taking? Authorizing Provider  levonorgestrel (MIRENA) 20 MCG/24HR IUD 1 each by Intrauterine route once. Inserted 10/2014   Yes [provider]  sertraline (ZOLOFT) 50 MG tablet 1/2 tab daily for 2 weeks and then increase to 1 tab 10/15/16   [provider]    Physical Exam Blood pressure 118/62, pulse 81, height 5\' 2"  (1.575 m), weight 200 lb (90.7 kg). Body mass index is 36.58 kg/m.  General: NAD HEENT: normocephalic, anicteric Thyroid: no enlargement Pulmonary: no increased work of breathing Neurologic: Grossly intact Psychiatric: mood appropriate, affect full  Assessment: 27 y.o. G2P1011 presenting for discussion of weight loss management options  Plan: Problem List Items Addressed This Visit    None  Visit Diagnoses    Class 2 obesity without serious comorbidity with body mass index (BMI) of 36.0 to 36.9 in adult, unspecified obesity type    -  Primary   Relevant Medications   phentermine (ADIPEX-P) 37.5 MG tablet      1) 1500 Calorie ADA Diet  2) Patient education given regarding appropriate lifestyle changes for weight loss including: regular physical activity, healthy coping strategies, caloric restriction and  healthy eating patterns.  3) Patient will be started on weight loss medication. The risks and benefits and side effects of medication, such as Adipex (Phenteramine) ,  Tenuate (Diethylproprion), Belviq (lorcarsin), Contrave (buproprion/naltrexone), Qsymia (phentermine/topiramate), and Saxenda (liraglutide) is discussed. The pros and cons of suppressing appetite and boosting metabolism is discussed. Risks of tolerence and addiction is discussed for selected agents discussed. Use of medicine will ne short term, such as 3-4 months at a time followed by a period of time off of the medicine to avoid these risks and side effects for Adipex, Qsymia, and Tenuate discussed. Pt to call with any negative side effects and agrees to keep follow up appts.  4) Comorbidity Screening - hypothyroidism screening, diabetes, and hyperlipidemia screening offered  5) Encouraged weekly weight monitorig to track progress and sample 1 week food diary  6) Contraception - discussed that all weight loss drugs fall in to pregnancy category X, patient currently has reliable contraception in the form of IUD  7) 15 minutes face-to-face; counseling/coordination of care > 50 percent of visit  8) Follow up in 4 weeks to assess response

## 2017-03-10 ENCOUNTER — Ambulatory Visit: Payer: Managed Care, Other (non HMO) | Admitting: Obstetrics and Gynecology

## 2017-03-29 ENCOUNTER — Ambulatory Visit: Payer: Managed Care, Other (non HMO) | Admitting: Obstetrics and Gynecology

## 2017-04-15 IMAGING — US US ABDOMEN LIMITED
1 series · 14 of 25 positions shown · non-contrast
Comparison: None.

CLINICAL DATA: Right upper quadrant abdominal pain x3 months

EXAM:
US ABDOMEN LIMITED - RIGHT UPPER QUADRANT

[Series 1: us abdomen limited · 0.19mm/px · 14 of 48 slices shown]
[im 1/48]
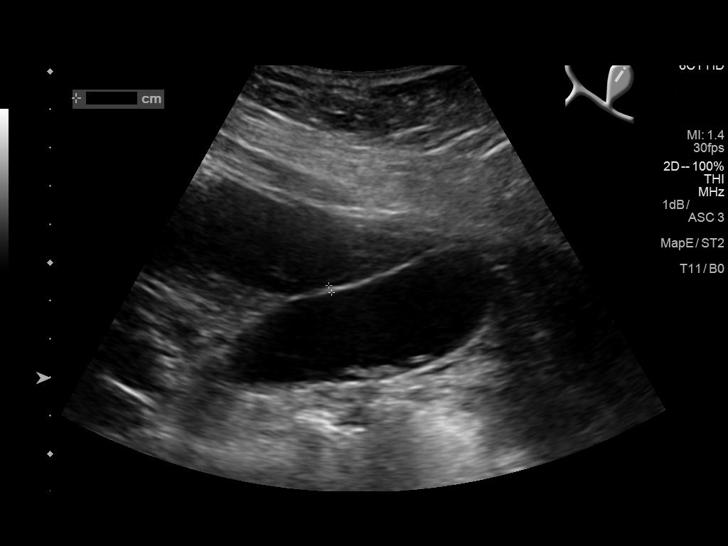
[im 4/48]
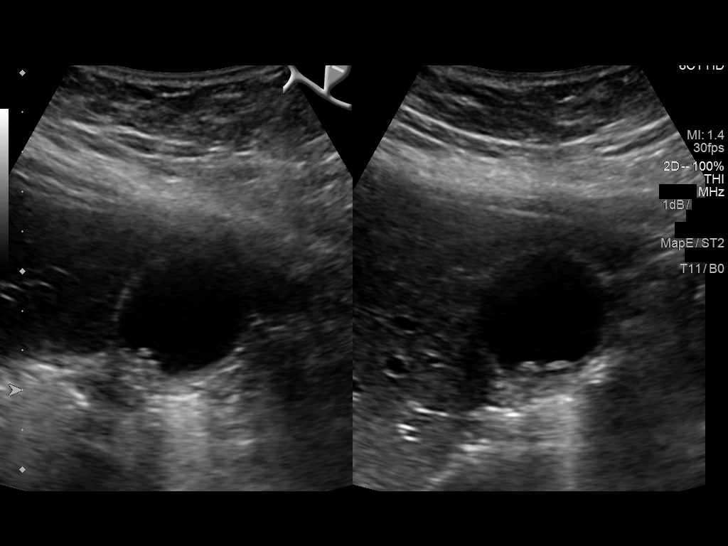
[im 8/48]
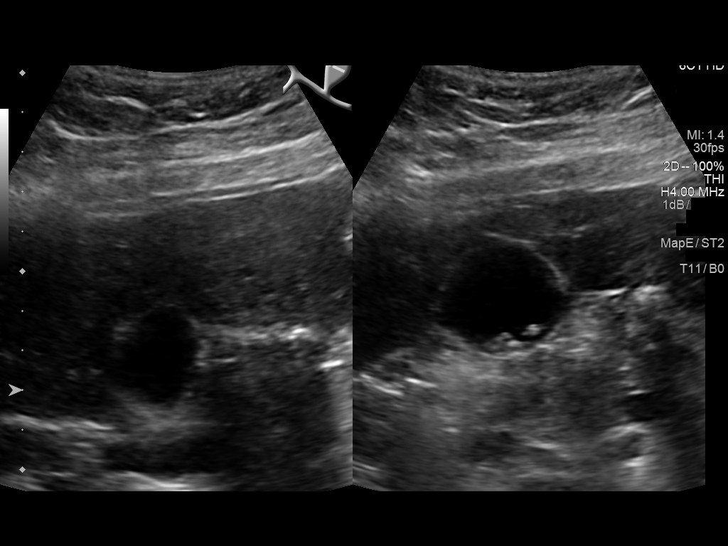
[im 12/48]
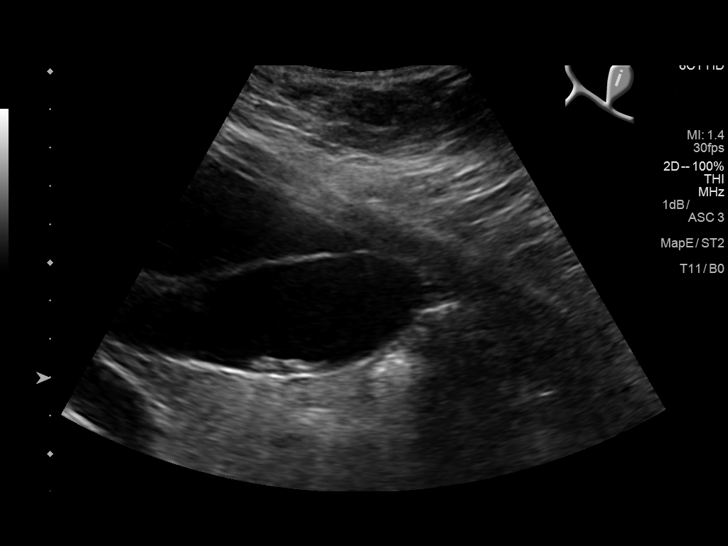
[im 16/48]
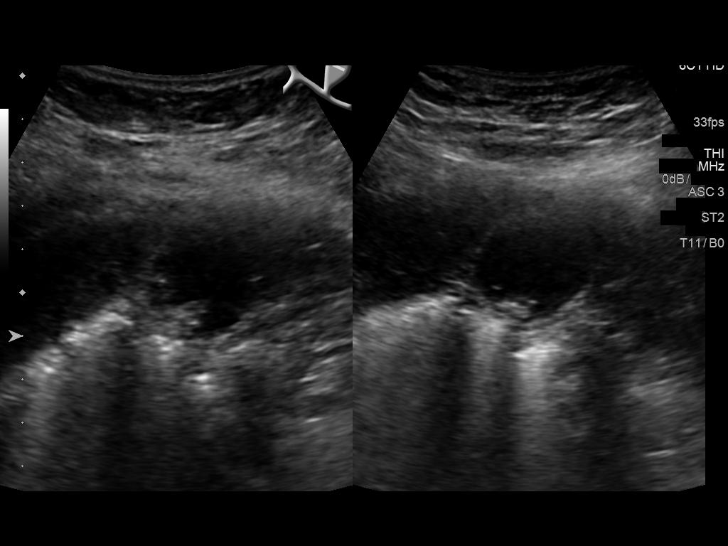
[im 18/48]
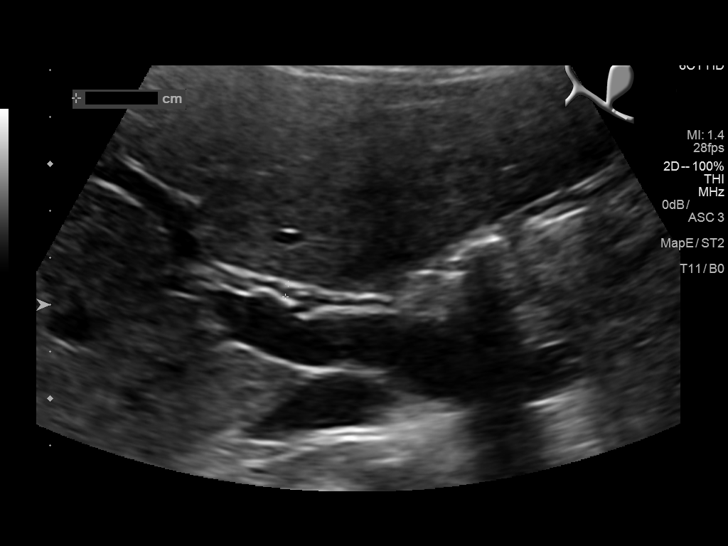
[im 22/48]
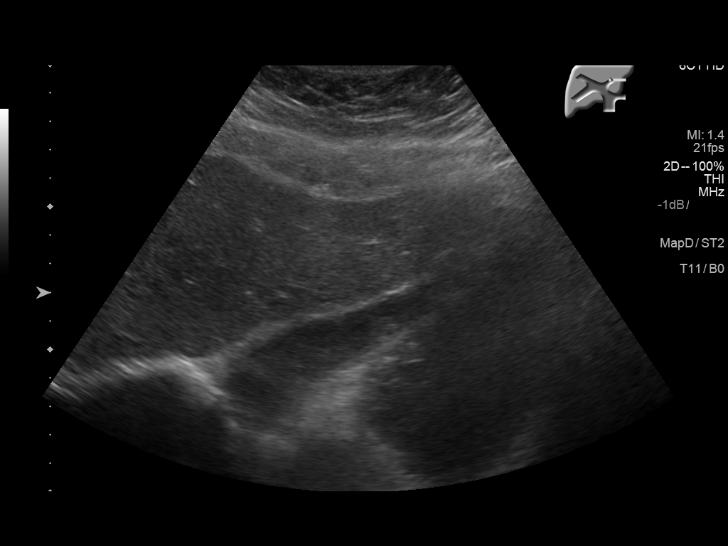
[im 26/48]
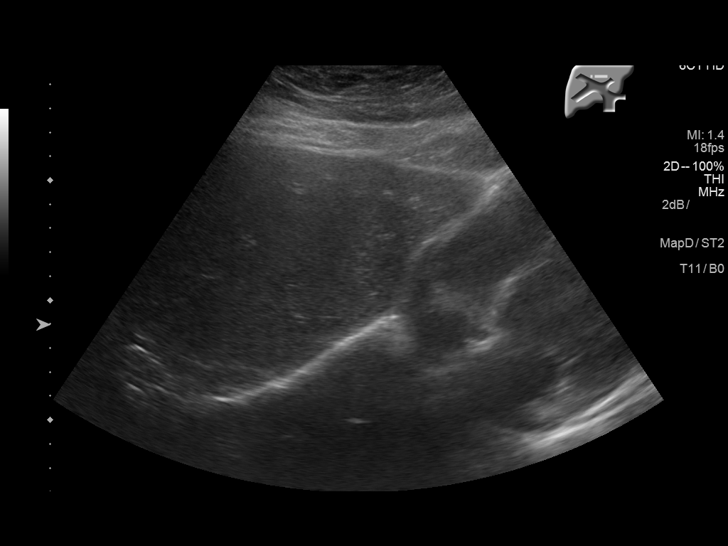
[im 30/48]
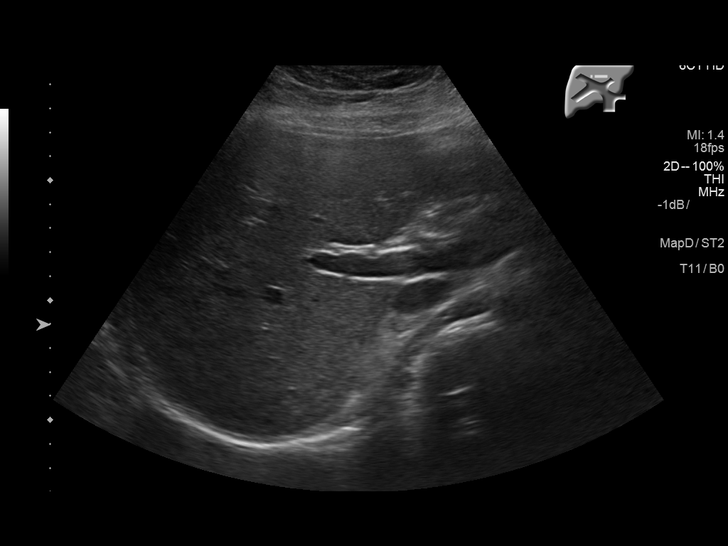
[im 32/48]
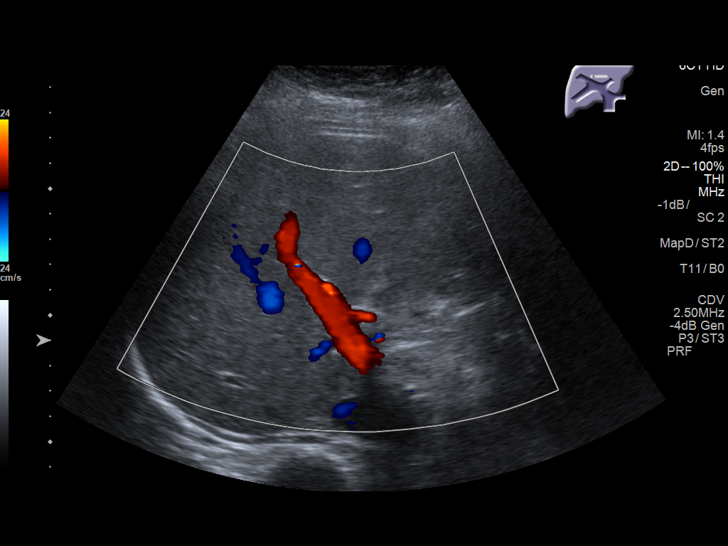
[im 36/48]
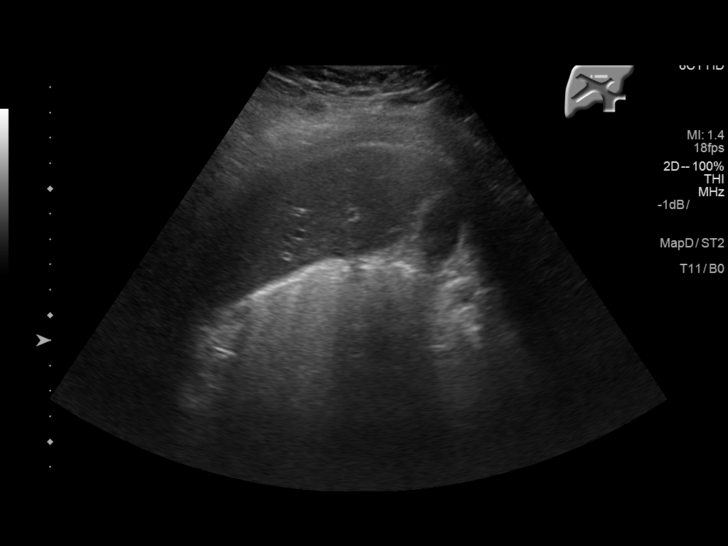
[im 40/48]
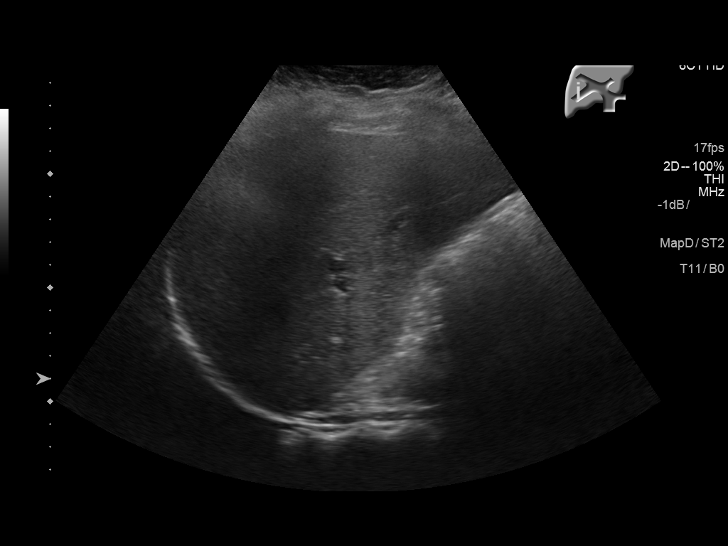
[im 44/48]
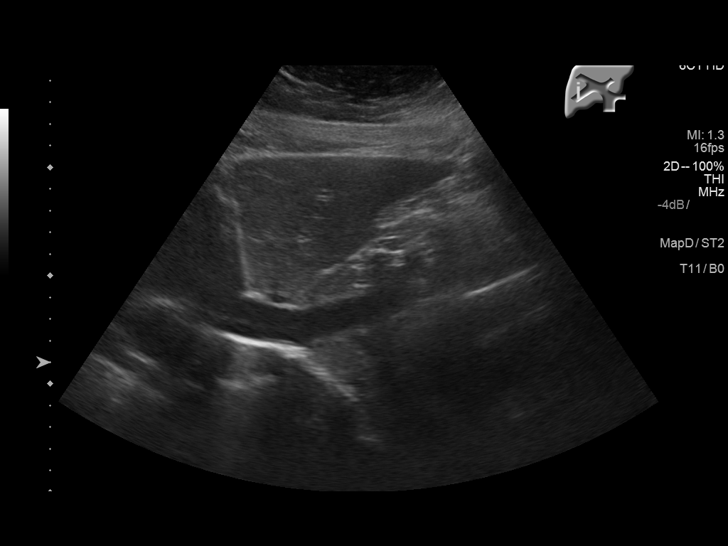
[im 48/48]
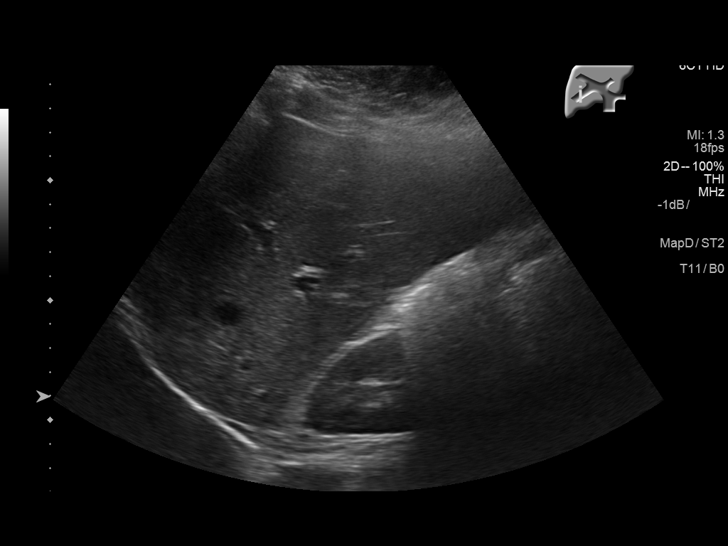

[14 of 25 positions shown; findings below may reference images not displayed]

FINDINGS: Gallbladder:

Multiple layering gallstones measuring up to 7 mm. No gallbladder
wall thickening or pericholecystic fluid. Negative sonographic
Murphy's sign.

Common bile duct:

Diameter: 3 mm

Liver:

No focal lesion identified. Within normal limits in parenchymal
echogenicity.
IMPRESSION: Cholelithiasis, without associated sonographic findings to suggest
acute cholecystitis.

## 2017-04-21 ENCOUNTER — Encounter: Payer: Self-pay | Admitting: Obstetrics and Gynecology

## 2017-04-21 ENCOUNTER — Ambulatory Visit (INDEPENDENT_AMBULATORY_CARE_PROVIDER_SITE_OTHER): Payer: Managed Care, Other (non HMO) | Admitting: Obstetrics and Gynecology

## 2017-04-21 VITALS — BP 104/62 | HR 73 | Wt 199.0 lb

## 2017-04-21 DIAGNOSIS — E669 Obesity, unspecified: Secondary | ICD-10-CM | POA: Diagnosis not present

## 2017-04-21 DIAGNOSIS — Z6836 Body mass index (BMI) 36.0-36.9, adult: Secondary | ICD-10-CM

## 2017-04-21 MED ORDER — PHENTERMINE HCL 37.5 MG PO TABS: 37.5000 mg | ORAL_TABLET | Freq: Every day | ORAL | 0 refills | Status: DC

## 2017-04-21 NOTE — Progress Notes (Signed)
Gynecology Office Visit  Chief Complaint:  Chief Complaint  Patient presents with  . Weight Check    History of Present Illness: Patientis a 27 y.o. G24P1011 female, who presents for the evaluation of the desire to lose weight. She has lost 1 pounds. The patient states the following symptoms since starting her weight loss therapy: appetite suppression, energy, and weight loss.  The patient also reports no other ill effects. The patient specifically denies heart palpitations, anxiety, and insomnia.   Review of Systems: 10 point review of systems negative unless otherwise noted in HPI  Past Medical History:  Past Medical History:  Diagnosis Date  . Anxiety   . Blood type, Rh positive   . Cervical insufficiency in pregnancy, antepartum    prior pregnancy  . GERD (gastroesophageal reflux disease)   . Obesity   . Polycystic disease, ovaries     Past Surgical History:  Past Surgical History:  Procedure Laterality Date  . CERVICAL CERCLAGE  2016  . CESAREAN SECTION N/A 09/02/2014   Procedure: CESAREAN SECTION;  Surgeon: [name], MD;  Location: [facility] ORS;  Service: Obstetrics;  Laterality: N/A;  . CHOLECYSTECTOMY N/A 05/02/2015   Procedure: LAPAROSCOPIC CHOLECYSTECTOMY WITH INTRAOPERATIVE CHOLANGIOGRAM;  Surgeon: Nadeen Landau, MD;  Location: [facility] ORS;  Service: General;  Laterality: N/A;  . INTRAUTERINE DEVICE (IUD) INSERTION  10/29/2014   [place]    Gynecologic History: No LMP recorded. Patient is not currently having periods (Reason: IUD).  Obstetric History: G2P1011  Family History:  Family History  Problem Relation Age of Onset  . Heart disease Father   . Kidney disease Father   . Hypertension Father     Social History:  Social History   Socioeconomic History  . Marital status: Married    Spouse name: Not on file  . Number of children: Not on file  . Years of education: Not on file  . Highest education level: Not on file  Social Needs  .  Financial resource strain: Not on file  . Food insecurity - worry: Not on file  . Food insecurity - inability: Not on file  . Transportation needs - medical: Not on file  . Transportation needs - non-medical: Not on file  Occupational History  . Not on file  Tobacco Use  . Smoking status: Never Smoker  . Smokeless tobacco: Never Used  Substance and Sexual Activity  . Alcohol use: No  . Drug use: No  . Sexual activity: Yes    Birth control/protection: IUD  Other Topics Concern  . Not on file  Social History Narrative  . Not on file    Allergies:  No Known Allergies  Medications: Prior to Admission medications   Medication Sig Start Date End Date Taking? Authorizing Provider  levonorgestrel (MIRENA) 20 MCG/24HR IUD 1 each by Intrauterine route once. Inserted 10/2014    [provider]  phentermine (ADIPEX-P) 37.5 MG tablet Take 1 tablet (37.5 mg total) by mouth daily before breakfast. 02/25/17   [name], MD  sertraline (ZOLOFT) 50 MG tablet 1/2 tab daily for 2 weeks and then increase to 1 tab 10/15/16   [provider]    Physical Exam Blood pressure 104/62, pulse 73, weight 199 lb (90.3 kg). Wt Readings from Last 3 Encounters:  04/21/17 199 lb (90.3 kg)  02/25/17 200 lb (90.7 kg)  01/11/17 191 lb (86.6 kg)   Body mass index is 36.4 kg/m.   General: NAD HEENT: normocephalic, anicteric Thyroid: no enlargement  Pulmonary: no increased work of breathing Neurologic: Grossly intact Psychiatric: mood appropriate, affect full  Assessment: 27 y.o. G2P1011 obesity Plan: Problem List Items Addressed This Visit      Other   Class 2 obesity without serious comorbidity with body mass index (BMI) of 36.0 to 36.9 in adult - Primary    Currently undergoing medical weight loss management with phentermine.  Discussed trial of Saxenda following phentermine course.      Relevant Medications   phentermine (ADIPEX-P) 37.5 MG tablet      1) 1500 Calorie  ADA Diet  2) Patient education given regarding appropriate lifestyle changes for weight loss including: regular physical activity, healthy coping strategies, caloric restriction and healthy eating patterns.  3) Patient will be started on weight loss medication. The risks and benefits and side effects of medication, such as Adipex (Phenteramine) ,  Tenuate (Diethylproprion), Belviq (lorcarsin), Contrave (buproprion/naltrexone), Qsymia (phentermine/topiramate), and Saxenda (liraglutide) is discussed. The pros and cons of suppressing appetite and boosting metabolism is discussed. Risks of tolerence and addiction is discussed for selected agents discussed. Use of medicine will ne short term, such as 3-4 months at a time followed by a period of time off of the medicine to avoid these risks and side effects for Adipex, Qsymia, and Tenuate discussed. Pt to call with any negative side effects and agrees to keep follow up appts.  4) Patient to take medication, with the benefits of appetite suppression and metabolism boost d/w pt, along with the side effects and risk factors of long term use that will be avoided with our use of short bursts of therapy. Rx provided.    5) 15 minutes face-to-face; with counseling/coordination of care > 50 percent of visit related to obesity and ongoing management/treatment   6)  Return in about 4 weeks (around 05/19/2017) for wt check.    Vena AustriaAndreas Delesa Kawa, MD, Evern CoreFACOG [facility], [facility] 04/22/2017, 1:19 PM

## 2017-04-22 DIAGNOSIS — Z6836 Body mass index (BMI) 36.0-36.9, adult: Secondary | ICD-10-CM

## 2017-04-22 DIAGNOSIS — E669 Obesity, unspecified: Secondary | ICD-10-CM | POA: Insufficient documentation

## 2017-04-22 DIAGNOSIS — E6609 Other obesity due to excess calories: Secondary | ICD-10-CM | POA: Insufficient documentation

## 2017-04-22 NOTE — Assessment & Plan Note (Signed)
Currently undergoing medical weight loss management with phentermine.  Discussed trial of Saxenda following phentermine course.

## 2017-04-23 ENCOUNTER — Other Ambulatory Visit: Payer: Self-pay | Admitting: Obstetrics and Gynecology

## 2017-05-09 ENCOUNTER — Other Ambulatory Visit: Payer: Self-pay | Admitting: Advanced Practice Midwife

## 2017-05-09 DIAGNOSIS — B379 Candidiasis, unspecified: Secondary | ICD-10-CM

## 2017-06-08 ENCOUNTER — Ambulatory Visit: Payer: Managed Care, Other (non HMO) | Admitting: Obstetrics and Gynecology

## 2017-06-17 ENCOUNTER — Ambulatory Visit (INDEPENDENT_AMBULATORY_CARE_PROVIDER_SITE_OTHER): Payer: Managed Care, Other (non HMO) | Admitting: Obstetrics and Gynecology

## 2017-06-17 ENCOUNTER — Encounter: Payer: Self-pay | Admitting: Obstetrics and Gynecology

## 2017-06-17 VITALS — BP 108/76 | HR 89 | Ht 62.0 in | Wt 194.0 lb

## 2017-06-17 DIAGNOSIS — F411 Generalized anxiety disorder: Secondary | ICD-10-CM | POA: Diagnosis not present

## 2017-06-17 DIAGNOSIS — E669 Obesity, unspecified: Secondary | ICD-10-CM | POA: Diagnosis not present

## 2017-06-17 DIAGNOSIS — Z6835 Body mass index (BMI) 35.0-35.9, adult: Secondary | ICD-10-CM

## 2017-06-17 MED ORDER — PHENTERMINE HCL 37.5 MG PO TABS: 37.5000 mg | ORAL_TABLET | Freq: Every day | ORAL | 0 refills | Status: DC

## 2017-06-17 MED ORDER — ESCITALOPRAM OXALATE 10 MG PO TABS: 10.0000 mg | ORAL_TABLET | Freq: Every day | ORAL | 3 refills | Status: DC

## 2017-06-17 NOTE — Progress Notes (Signed)
Gynecology Office Visit  Chief Complaint:  Chief Complaint  Patient presents with  . Weight Check  . Anxiety/depression    History of Present Illness: Patientis a 28 y.o. G33P1011 female, who presents for the evaluation of the desire to lose weight. She has lost 5 pounds 1 months for 2 month total weight loss of 5lbs. The patient states the following symptoms since starting her weight loss therapy: appetite suppression, energy, and weight loss.  The patient also reports no other ill effects. The patient specifically denies heart palpitations, anxiety, and insomnia.    The patient is a 27 y.o. female presenting initial evaluation for symptoms of anxiety.  The patient is currently taking anything for the management of her symptoms although she was prescribed Zoloft by her PCP.  She has not had any recent situational stressors.  She reports symptoms of anhedonia, insomnia, irritability, increased appetite and social anxiety.  She denies day time somnolence, risk taking behavior, decreased appetite, agorophobia, feelings of guilt, feelings of worthlessness, suicidal ideation, homicidal ideation, auditory hallucinations and visual hallucinations. Symptoms have worsened since initial onset.     The patient does have a pre-existing history of depression and anxiety.  She  does not a prior history of suicide attempts.    Review of Systems: 10 point review of systems negative unless otherwise noted in HPI  Past Medical History:  Past Medical History:  Diagnosis Date  . Anxiety   . Blood type, Rh positive   . Cervical insufficiency in pregnancy, antepartum    prior pregnancy  . GERD (gastroesophageal reflux disease)   . Obesity   . Polycystic disease, ovaries     Past Surgical History:  Past Surgical History:  Procedure Laterality Date  . CERVICAL CERCLAGE  2016  . CESAREAN SECTION N/A 09/02/2014   Procedure: CESAREAN SECTION;  Surgeon: [name], MD;  Location: [facility] ORS;   Service: Obstetrics;  Laterality: N/A;  . CHOLECYSTECTOMY N/A 05/02/2015   Procedure: LAPAROSCOPIC CHOLECYSTECTOMY WITH INTRAOPERATIVE CHOLANGIOGRAM;  Surgeon: Nadeen Landau, MD;  Location: [facility] ORS;  Service: General;  Laterality: N/A;  . INTRAUTERINE DEVICE (IUD) INSERTION  10/29/2014   [place]    Gynecologic History: No LMP recorded. (Menstrual status: IUD).  Obstetric History: G2P1011  Family History:  Family History  Problem Relation Age of Onset  . Heart disease Father   . Kidney disease Father   . Hypertension Father     Social History:  Social History   Socioeconomic History  . Marital status: Married    Spouse name: Not on file  . Number of children: Not on file  . Years of education: Not on file  . Highest education level: Not on file  Occupational History  . Not on file  Social Needs  . Financial resource strain: Not on file  . Food insecurity:    Worry: Not on file    Inability: Not on file  . Transportation needs:    Medical: Not on file    Non-medical: Not on file  Tobacco Use  . Smoking status: Never Smoker  . Smokeless tobacco: Never Used  Substance and Sexual Activity  . Alcohol use: No  . Drug use: No  . Sexual activity: Yes    Birth control/protection: IUD  Lifestyle  . Physical activity:    Days per week: Not on file    Minutes per session: Not on file  . Stress: Not on file  Relationships  . Social connections:  Talks on phone: Not on file    Gets together: Not on file    Attends religious service: Not on file    Active member of club or organization: Not on file    Attends meetings of clubs or organizations: Not on file    Relationship status: Not on file  . Intimate partner violence:    Fear of current or ex partner: Not on file    Emotionally abused: Not on file    Physically abused: Not on file    Forced sexual activity: Not on file  Other Topics Concern  . Not on file  Social History Narrative  . Not on file     Allergies:  No Known Allergies  Medications: Prior to Admission medications   Medication Sig Start Date End Date Taking? Authorizing Provider  levonorgestrel (MIRENA) 20 MCG/24HR IUD 1 each by Intrauterine route once. Inserted 10/2014    [provider]  phentermine (ADIPEX-P) 37.5 MG tablet Take 1 tablet (37.5 mg total) by mouth daily before breakfast. 04/21/17   [name], MD  sertraline (ZOLOFT) 50 MG tablet 1/2 tab daily for 2 weeks and then increase to 1 tab 10/15/16   [provider]    Physical Exam Blood pressure 108/76, pulse 89, height  (1.575 m), weight 194 lb (88 kg). Wt Readings from Last 3 Encounters:  06/17/17 194 lb (88 kg)  04/21/17 199 lb (90.3 kg)  02/25/17 200 lb (90.7 kg)  Body mass index is 35.48 kg/m.   General: NAD HEENT: normocephalic, anicteric Thyroid: no enlargement Pulmonary: no increased work of breathing Neurologic: Grossly intact Psychiatric: mood appropriate, affect full  Assessment: 27 y.o. G2P1011 medical weight loss follow up  Plan: Problem List Items Addressed This Visit    None    Visit Diagnoses    Generalized anxiety disorder    -  Primary   Relevant Medications   escitalopram (LEXAPRO) 10 MG tablet   Class 2 obesity without serious comorbidity with body mass index (BMI) of 35.0 to 35.9 in adult, unspecified obesity type       Relevant Medications   phentermine (ADIPEX-P) 37.5 MG tablet      1) 1500 Calorie ADA Diet  2) Patient education given regarding appropriate lifestyle changes for weight loss including: regular physical activity, healthy coping strategies, caloric restriction and healthy eating patterns.  3) Patient will be started on weight loss medication. The risks and benefits and side effects of medication, such as Adipex (Phenteramine) ,  Tenuate (Diethylproprion), Belviq (lorcarsin), Contrave (buproprion/naltrexone), Qsymia (phentermine/topiramate), and Saxenda (liraglutide) is  discussed. The pros and cons of suppressing appetite and boosting metabolism is discussed. Risks of tolerence and addiction is discussed for selected agents discussed. Use of medicine will ne short term, such as 3-4 months at a time followed by a period of time off of the medicine to avoid these risks and side effects for Adipex, Qsymia, and Tenuate discussed. Pt to call with any negative side effects and agrees to keep follow up appts. - continue phentermine  4) Patient to take medication, with the benefits of appetite suppression and metabolism boost d/w pt, along with the side effects and risk factors of long term use that will be avoided with our use of short bursts of therapy. Rx provided.    5) 15 minutes face-to-face; with counseling/coordination of care > 50 percent of visit related to obesity and ongoing management/treatment   6) GAD-7 is 14 PHQ-9 is 11 item 9 is 0,  start lexapro  7) Return in about 1 month (around 07/15/2017) for medication follow up.    [name], MD, [facility], [facility] 06/17/2017, 11:48 AM

## 2017-07-08 ENCOUNTER — Ambulatory Visit (INDEPENDENT_AMBULATORY_CARE_PROVIDER_SITE_OTHER): Payer: Managed Care, Other (non HMO) | Admitting: Obstetrics & Gynecology

## 2017-07-08 ENCOUNTER — Encounter: Payer: Self-pay | Admitting: Obstetrics & Gynecology

## 2017-07-08 VITALS — BP 120/80 | Ht 62.0 in | Wt 195.0 lb

## 2017-07-08 DIAGNOSIS — Z202 Contact with and (suspected) exposure to infections with a predominantly sexual mode of transmission: Secondary | ICD-10-CM

## 2017-07-08 DIAGNOSIS — N9089 Other specified noninflammatory disorders of vulva and perineum: Secondary | ICD-10-CM | POA: Diagnosis not present

## 2017-07-08 NOTE — Progress Notes (Signed)
Gynecology STD Evaluation   Chief Complaint:  Chief Complaint  Patient presents with  . Vaginal Itching   History of Present Illness: Patient is a 27 y.o. G2P1011 presents for STD testing.  The patient has not noted intermenstrual spotting,  has not experienced postcoital bleeding, and does report increased vaginal discharge.  There is not a history of prior sexually transmitted infection(s).   She has ext itchy lesions w mild pain.  The patient is sexually active and reports 3 lifetime sexual partners . She currently uses None for contraception. sometimes, the patient also relies on condoms to prevent the spread of sexually transmitted infections.  She patient has been previously transfused or tattooed.    PMHx: She  has a past medical history of Anxiety, Blood type, Rh positive, Cervical insufficiency in pregnancy, antepartum, GERD (gastroesophageal reflux disease), Obesity, and Polycystic disease, ovaries. Also,  has a past surgical history that includes Cervical cerclage (2016); Cesarean section (N/A, 09/02/2014); Cholecystectomy (N/A, 05/02/2015); and Intrauterine device (iud) insertion (10/29/2014)., family history includes Heart disease in her father; Hypertension in her father; Kidney disease in her father.,  reports that she has never smoked. She has never used smokeless tobacco. She reports that she does not drink alcohol or use drugs.  She has a current medication list which includes the following prescription(s): escitalopram, levonorgestrel, and phentermine. Also, has No Known Allergies.  Review of Systems  Constitutional: Negative for chills, fever and malaise/fatigue.  HENT: Negative for congestion, sinus pain and sore throat.   Eyes: Negative for blurred vision and pain.  Respiratory: Negative for cough and wheezing.   Cardiovascular: Negative for chest pain and leg swelling.  Gastrointestinal: Negative for abdominal pain, constipation, diarrhea, heartburn, nausea and vomiting.   Genitourinary: Negative for dysuria, frequency, hematuria and urgency.  Musculoskeletal: Negative for back pain, joint pain, myalgias and neck pain.  Skin: Negative for itching and rash.  Neurological: Negative for dizziness, tremors and weakness.  Endo/Heme/Allergies: Does not bruise/bleed easily.  Psychiatric/Behavioral: Negative for depression. The patient is not nervous/anxious and does not have insomnia.     Objective: BP 120/80   Ht  (1.575 m)   Wt 195 lb (88.5 kg)   BMI 35.67 kg/m  Physical Exam  Constitutional: She is oriented to person, place, and time. She appears well-developed and well-nourished. No distress.  Genitourinary: Vagina normal and uterus normal. Pelvic exam was performed with patient supine. There is no rash, tenderness or lesion on the right labia. There is no rash, tenderness or lesion on the left labia.    No erythema or bleeding in the vagina. Right adnexum does not display mass and does not display tenderness. Left adnexum does not display mass and does not display tenderness. Cervix does not exhibit motion tenderness, discharge, polyp or nabothian cyst.   Uterus is mobile and midaxial. Uterus is not enlarged or exhibiting a mass.  Genitourinary Comments: Areas on diagram where scaly lesions occur, no vesicles seen, min T  HENT:  Head: Normocephalic and atraumatic.  Nose: Nose normal.  Mouth/Throat: Oropharynx is clear and moist.  Abdominal: Soft. She exhibits no distension. There is no tenderness.  Musculoskeletal: Normal range of motion.  Neurological: She is alert and oriented to person, place, and time. No cranial nerve deficit.  Skin: Skin is warm and dry.  Psychiatric: She has a normal mood and affect.   Female chaperone present for pelvic and breast  portions of the physical exam  Assessment: 27 y.o. G2P1011 No problem-specific Assessment &  Plan notes found for this encounter.   Plan: Problem List Items Addressed This Visit    None     Visit Diagnoses    STD exposure    -  Primary   Relevant Orders   Herpes simplex virus culture   HSV(herpes smplx)abs-1+2(IgG+IgM)-bld   HSV NAA   HEP, RPR, HIV Panel   Hepatitis C antibody   NuSwab Vaginitis Plus (VG+)   Vulvar lesion         1) Contraception - Education given regarding options for contraception, including barrier methods.  2) STI screening was offered  And done  3) STD prevention  Annamarie Major, MD, [facility], [facility] 07/08/2017  3:29 PM

## 2017-07-08 NOTE — Patient Instructions (Signed)

## 2017-07-11 LAB — HERPES SIMPLEX VIRUS CULTURE

## 2017-07-12 ENCOUNTER — Other Ambulatory Visit: Payer: Self-pay | Admitting: Obstetrics & Gynecology

## 2017-07-12 MED ORDER — VALACYCLOVIR HCL 1 G PO TABS: 1000.0000 mg | ORAL_TABLET | Freq: Two times a day (BID) | ORAL | 0 refills | Status: AC

## 2017-07-12 NOTE — Progress Notes (Signed)
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 Valtrex called in for treatment (1 g BID 10 days) F/U 4-6 weeks after tx Suppressive dosing options discussed as well Annamarie MajorPaul Irby Fails, MD, [facility] FrederickFAC[facility], [facility] 07/12/2017  10:19 AM

## 2017-07-14 LAB — HSV(HERPES SMPLX)ABS-I+II(IGG+IGM)-BLD
HSV 1 Glycoprotein G Ab, IgG: <0.91 index (ref 0.00–0.90)
HSV 2 IGG, TYPE SPEC: 11.4 {index} — AB (ref 0.00–0.90)
HSVI/II COMB AB IGM: 1.49 ratio — AB (ref 0.00–0.90)

## 2017-07-14 LAB — HEP, RPR, HIV PANEL
HIV Screen 4th Generation wRfx: NONREACTIVE
Hepatitis B Surface Ag: NEGATIVE
RPR: NONREACTIVE

## 2017-07-14 LAB — HSV NAA
HSV 1 NAA: NEGATIVE
HSV 2 NAA: POSITIVE — AB

## 2017-07-14 LAB — HEPATITIS C ANTIBODY: Hep C Virus Ab: <0.1 s/co ratio (ref 0.0–0.9)

## 2017-08-18 ENCOUNTER — Encounter: Payer: Self-pay | Admitting: Obstetrics & Gynecology

## 2017-08-18 ENCOUNTER — Ambulatory Visit (INDEPENDENT_AMBULATORY_CARE_PROVIDER_SITE_OTHER): Payer: Managed Care, Other (non HMO) | Admitting: Obstetrics & Gynecology

## 2017-08-18 VITALS — BP 130/80 | Ht 62.0 in | Wt 200.0 lb

## 2017-08-18 DIAGNOSIS — Z30430 Encounter for insertion of intrauterine contraceptive device: Secondary | ICD-10-CM | POA: Diagnosis not present

## 2017-08-18 DIAGNOSIS — B009 Herpesviral infection, unspecified: Secondary | ICD-10-CM | POA: Diagnosis not present

## 2017-08-18 DIAGNOSIS — E669 Obesity, unspecified: Secondary | ICD-10-CM | POA: Insufficient documentation

## 2017-08-18 MED ORDER — TOPIRAMATE 25 MG PO TABS: 25.0000 mg | ORAL_TABLET | Freq: Two times a day (BID) | ORAL | 5 refills | Status: DC

## 2017-08-18 MED ORDER — DIETHYLPROPION HCL 25 MG PO TABS: 1.0000 | ORAL_TABLET | Freq: Every day | ORAL | 0 refills | Status: DC

## 2017-08-18 MED ORDER — VALACYCLOVIR HCL 500 MG PO TABS: 500.0000 mg | ORAL_TABLET | Freq: Every day | ORAL | 11 refills | Status: AC

## 2017-08-18 NOTE — Progress Notes (Signed)
History of Present Illness:  Regina Chambers is a 27 y.o. 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 Since that time, she states that her symptoms are improving.  Worried about recurrence and transmission to partner.  Also, IUD fell out (after 3 years, Mirena).  Also, worried about continued weight and difficulty in losing weight on her own.  PMHx: She  has a past medical history of Anxiety, Blood type, Rh positive, Cervical insufficiency in pregnancy, antepartum, GERD (gastroesophageal reflux disease), Obesity, and Polycystic disease, ovaries. Also,  has a past surgical history that includes Cervical cerclage (2016); Cesarean section (N/A, 09/02/2014); Cholecystectomy (N/A, 05/02/2015); and Intrauterine device (iud) insertion (10/29/2014)., family history includes Heart disease in her father; Hypertension in her father; Kidney disease in her father.,  reports that she has never smoked. She has never used smokeless tobacco. She reports that she does not drink alcohol or use drugs. Current Meds  Medication Sig  . escitalopram (LEXAPRO) 10 MG tablet Take 1 tablet (10 mg total) by mouth daily.  . Also, has No Known Allergies..  Review of Systems  Constitutional: Negative for chills, fever and malaise/fatigue.  HENT: Negative for congestion, sinus pain and sore throat.   Eyes: Negative for blurred vision and pain.  Respiratory: Negative for cough and wheezing.   Cardiovascular: Negative for chest pain and leg swelling.  Gastrointestinal: Negative for abdominal pain, constipation, diarrhea, heartburn, nausea and vomiting.  Genitourinary: Negative for dysuria, frequency, hematuria and urgency.  Musculoskeletal: Negative for back pain, joint pain, myalgias and neck pain.  Skin: Negative for itching and rash.  Neurological: Negative for dizziness, tremors and weakness.  Endo/Heme/Allergies: Does  not bruise/bleed easily.  Psychiatric/Behavioral: Negative for depression. The patient is not nervous/anxious and does not have insomnia.     Physical Exam:  BP 130/80   Ht 5\' 2"  (1.575 m)   Wt 200 lb (90.7 kg)   BMI 36.58 kg/m  Body mass index is 36.58 kg/m. Constitutional NAD, Conversant  Skin No rashes, lesions or ulceration. Normal palpated skin turgor. No nodularity.  Abdomen: Soft.  Non-tender.  No masses.  No HSM. No hernia  Extremities: Moves all appropriately.  Normal ROM for age. No lymphadenopathy.  Neuro: Grossly intact  Psych: Oriented to PPT.  Normal mood. Normal affect.     Pelvic:   Vulva: Normal appearance.  No lesions.  Vagina: No lesions or abnormalities noted.  Support: Normal pelvic support.  Urethra No masses tenderness or scarring.  Meatus Normal size without lesions or prolapse.  Cervix: Normal appearance.  No lesions.  Anus: Normal exam.  No lesions.  Perineum: Normal exam.  No lesions.        Bimanual   Uterus: Normal size.  Non-tender.  Mobile.  AV.  Adnexae: No masses.  Non-tender to palpation.  Cul-de-sac: Negative for abnormality.   IUD PROCEDURE NOTE:  Regina Chambers is a 27 y.o. G2P1011 here for IUD insertion. No GYN concerns.  Last pap smear was normal.  IUD Insertion Procedure Note Patient identified, informed consent performed, consent signed.   Discussed risks of irregular bleeding, cramping, infection, malpositioning or misplacement of the IUD outside the uterus which may require further procedure such as laparoscopy, risk of failure <1%. Time out was performed.  Urine pregnancy test negative.  A bimanual exam showed the uterus to be midposition.  Speculum placed in the vagina.  Cervix visualized.  Cleaned with Betadine x 2.  Grasped anteriorly with a single tooth tenaculum.  Uterus sounded to 7 cm.   IUD placed per manufacturer's recommendations.  Strings trimmed to 3 cm. Tenaculum was removed, good hemostasis noted.  Patient  tolerated procedure well.   Patient was given post-procedure instructions.  She was advised to have backup contraception for one week.  Patient was also asked to check IUD strings periodically and follow up in 4 weeks for IUD check.  Assessment: 3 problems addressed today. Problem List Items Addressed This Visit      Other   Obesity (BMI 30-39.9)   Relevant Medications   Diethylpropion HCl 25 MG TABS    Other Visit Diagnoses    HSV (herpes simplex virus) infection    -  Primary   Relevant Medications   valACYclovir (VALTREX) 500 MG tablet   Encounter for insertion of intrauterine contraceptive device (IUD)         Medication treatment is completed for tx of HSV.  Has healed and no recurrences.  Plan:1. HSV (herpes simplex virus) infection She will undergo suppressive therapy for HSV in her medical therapy.  2. Encounter for insertion of intrauterine contraceptive device (IUD) IUD replaced today F/u One Month  3. Obesity (BMI 30-39.9) Weight loss discussed, Tenuate and Topamax prescribed today.  SIde effects discussed.  A total of 25 minutes were spent face-to-face with the patient during this encounter and over half of that time dealt with counseling and coordination of care, especially in regards to HSV and future considerations, weight loss and goals and management plans.  Annamarie Major, MD, [facility], [facility] 08/18/2017  2:43 PM

## 2017-08-18 NOTE — Patient Instructions (Signed)
Diethylpropion tablets What is this medicine? DIETHYLPROPION (dye eth il PROE pee on) decreases your appetite. It is used with a reduced calorie diet and exercise to help you lose weight. This medicine is only meant to be used for a few weeks. This medicine may be used for other purposes; ask your health care provider or pharmacist if you have questions. COMMON BRAND NAME(S): Depletite # 2, Radtue, Tenuate What should I tell my health care provider before I take this medicine? They need to know if you have any of these conditions: -agitation -glaucoma -high blood pressure -history of drug dependence or substance abuse -hyperthyroid -kidney or liver disease -lung disease -valvular heart disease -an unusual or allergic reaction to diethylpropion, other medicines, foods, dyes, or preservatives -pregnant or trying to get pregnant -breast-feeding How should I use this medicine? Take this medicine by mouth with a glass of water. Follow the directions on the prescription label. Take this medicine 1 hour before meals. If a meal is missed, do not take that dose. An additional tablet may be taken in the mid evening if needed for night time hunger. Do not take your medicine more often than directed. Talk to your pediatrician regarding the use of this medicine in children. Special care may be needed. While this medicine may be prescribed for children as young as 16 years for selected conditions, precautions do apply. Overdosage: If you think you have taken too much of this medicine contact a poison control center or emergency room at once. NOTE: This medicine is only for you. Do not share this medicine with others. What if I miss a dose? If you miss a dose, take a dose as soon as you can with the next meal. If it is almost time for your next dose, take only that dose. Do not take double or extra doses. What may interact with this medicine? Do not take this medicine with any of the following  medications: -fluoxetine -MAOIs like Carbex, Eldepryl, Marplan, Nardil, and Parnate -medicines for colds or breathing difficulties like pseudoephedrine or phenylephrine -other medicines or herbal products for weight loss or to decrease appetite -procarbazine -sibutramine -stimulants like amphetamine, dextroamphetamine, dexmethylphenidate, methylphenidate or modafinil This medicine may also interact with the following medications: -general anesthetics -insulin and other medicines for diabetes -medicines for high blood pressure -phenothiazines like chlorpromazine, mesoridazine, prochlorperazine, thioridazine This list may not describe all possible interactions. Give your health care provider a list of all the medicines, herbs, non-prescription drugs, or dietary supplements you use. Also tell them if you smoke, drink alcohol, or use illegal drugs. Some items may interact with your medicine. What should I watch for while using this medicine? Visit your doctor or health care professional for regular checks on your progress. You need to closely monitor your weight loss. If your rate of weight loss slows down or stops, you may need to stop the medicine, and restart after a time without the medicine. You may get dizzy. Do not drive, use machinery, or do anything that needs mental alertness until you know how this medicine affects you. Do not stand or sit up quickly, especially if you are an older patient. This reduces the risk of dizzy or fainting spells. Alcohol may increase dizziness. Avoid alcoholic drinks. What side effects may I notice from receiving this medicine? Side effects that you should report to your doctor or health care professional as soon as possible: -allergic reactions like skin rash, itching or hives, swelling of the face, lips, or tongue -  angry, confusion, more nervous, restless -breast growth in men -breathing problems -changes in vision -chest pain -difficulty with  balance -fast, irregular heartbeat -hallucinations -nausea, vomiting -seizures -tremors -trouble sleeping Side effects that usually do not require medical attention (report to your doctor or health care professional if they continue or are bothersome): -constipation or diarrhea -dry mouth or unpleasant taste -flushing of the skin -hair loss -headache -increase or decrease in sexual desire or performance -menstrual irregularity -red or itchy skin -upset stomach This list may not describe all possible side effects. Call your doctor for medical advice about side effects. You may report side effects to FDA at 1-800-FDA-1088. Where should I keep my medicine? Keep out of the reach of children. This medicine can be abused. Keep your medicine in a safe place to protect it from theft. Do not share this medicine with anyone. Selling or giving away this medicine is dangerous and against the law. This medicine may cause accidental overdose and death if taken by other adults, children, or pets. Mix any unused medicine with a substance like cat litter or coffee grounds. Then throw the medicine away in a sealed container like a sealed bag or a coffee can with a lid. Do not use the medicine after the expiration date. Store at room temperature below 30 degrees C (86 degrees F). Protect from heat. Keep container tightly closed. NOTE: This sheet is a summary. It may not cover all possible information. If you have questions about this medicine, talk to your doctor, pharmacist, or health care provider.  2018 Elsevier/Gold Standard (2013-10-16 15:18:21)  

## 2017-08-19 ENCOUNTER — Other Ambulatory Visit: Payer: Self-pay | Admitting: Obstetrics & Gynecology

## 2017-09-15 ENCOUNTER — Telehealth: Payer: Self-pay

## 2017-09-15 NOTE — Telephone Encounter (Signed)
Pt calling [facility] for a referral for weight loss surgery, Pt states she has discussed this with him in the past. Pt states phentermine and any other medications she has used has not worked.

## 2017-09-15 NOTE — Telephone Encounter (Signed)
Please advise 

## 2017-09-19 NOTE — Telephone Encounter (Signed)
Pt called again stating that she needs a referral for a gastric sleeve. Pt stated that she would like to speak with a nurse about the referral process and documentation.

## 2017-09-20 ENCOUNTER — Other Ambulatory Visit: Payer: Self-pay | Admitting: Obstetrics and Gynecology

## 2017-09-20 NOTE — Progress Notes (Unsigned)
amb  

## 2017-09-27 ENCOUNTER — Ambulatory Visit: Payer: Managed Care, Other (non HMO) | Admitting: Obstetrics and Gynecology

## 2017-09-30 ENCOUNTER — Telehealth: Payer: Self-pay

## 2017-09-30 NOTE — Telephone Encounter (Signed)
spoke to pt she had question about referral done by GYN

## 2017-09-30 NOTE — Telephone Encounter (Signed)
Copied from CRM #[id number]. Topic: General - Other >> Sep 30, 2017 10:25 AM Marylen PontoMcneil, Ja-Kwan wrote: Reason for CRM: Pt states she has been speaking with Cecile Sheereregina Baity's nurse and she would like a call back. Cb# [phone]

## 2017-10-04 ENCOUNTER — Ambulatory Visit: Payer: Managed Care, Other (non HMO) | Admitting: Internal Medicine

## 2017-10-07 ENCOUNTER — Ambulatory Visit: Payer: Managed Care, Other (non HMO) | Admitting: Obstetrics and Gynecology

## 2017-11-15 ENCOUNTER — Ambulatory Visit: Payer: Managed Care, Other (non HMO) | Admitting: Internal Medicine

## 2017-11-15 DIAGNOSIS — Z0289 Encounter for other administrative examinations: Secondary | ICD-10-CM

## 2017-11-28 ENCOUNTER — Ambulatory Visit: Payer: Managed Care, Other (non HMO) | Admitting: Internal Medicine

## 2017-11-28 ENCOUNTER — Encounter: Payer: Self-pay | Admitting: Internal Medicine

## 2017-11-28 VITALS — BP 124/72 | HR 72 | Temp 99.0°F | Ht 62.0 in | Wt 221.2 lb

## 2017-11-28 DIAGNOSIS — R635 Abnormal weight gain: Secondary | ICD-10-CM

## 2017-11-28 DIAGNOSIS — Z0289 Encounter for other administrative examinations: Secondary | ICD-10-CM | POA: Diagnosis not present

## 2017-11-28 MED ORDER — PHENTERMINE HCL 37.5 MG PO CAPS: 37.5000 mg | ORAL_CAPSULE | ORAL | 0 refills | Status: DC

## 2017-11-28 NOTE — Patient Instructions (Signed)

## 2017-11-28 NOTE — Progress Notes (Signed)
Subjective:    Patient ID: Regina Chambers, female    DOB: Jul 30, 1990, 27 y.o.   MRN: [id number]  HPI  Pt presents to the clinic today to discuss abnormal weight gain. She had been through the process with [facility] in preparation for the bariatric sleeve. She reports she has not been in able to get in touch with there office for the last 2 months. She has decided against pursuing surgery at this time and wants to restart Phentermine. Her weight today is 221 lbs with a BMI of 40.47. She also has a BMI appeal form that she needs completed for work.  Breakfast: pop tarts, or egg and [place] sausage Lunch: salad or sandwich Dinner: meat, starch and veggies Snacks: pretzels, popcorn, cheese and peanuts Exercise: None  Review of Systems  Past Medical History:  Diagnosis Date  . Anxiety   . Blood type, Rh positive   . Cervical insufficiency in pregnancy, antepartum    prior pregnancy  . GERD (gastroesophageal reflux disease)   . Obesity   . Polycystic disease, ovaries     Current Outpatient Medications  Medication Sig Dispense Refill  . escitalopram (LEXAPRO) 10 MG tablet Take 1 tablet (10 mg total) by mouth daily. 30 tablet 3  . levonorgestrel (MIRENA) 20 MCG/24HR IUD 1 each by Intrauterine route once. Inserted 10/2014    . phentermine (ADIPEX-P) 37.5 MG tablet Take 1 tablet (37.5 mg total) by mouth daily before breakfast. (Patient not taking: Reported on 08/18/2017) 30 tablet 0  . topiramate (TOPAMAX) 25 MG tablet Take 1 tablet (25 mg total) by mouth 2 (two) times daily. 60 tablet 5   No current facility-administered medications for this visit.     No Known Allergies  Family History  Problem Relation Age of Onset  . Heart disease Father   . Kidney disease Father   . Hypertension Father     Social History   Socioeconomic History  . Marital status: Married    Spouse name: Not on file  . Number of children: Not on file  . Years of education: Not on file   . Highest education level: Not on file  Occupational History  . Not on file  Social Needs  . Financial resource strain: Not on file  . Food insecurity:    Worry: Not on file    Inability: Not on file  . Transportation needs:    Medical: Not on file    Non-medical: Not on file  Tobacco Use  . Smoking status: Never Smoker  . Smokeless tobacco: Never Used  Substance and Sexual Activity  . Alcohol use: No  . Drug use: No  . Sexual activity: Yes    Birth control/protection: IUD  Lifestyle  . Physical activity:    Days per week: Not on file    Minutes per session: Not on file  . Stress: Not on file  Relationships  . Social connections:    Talks on phone: Not on file    Gets together: Not on file    Attends religious service: Not on file    Active member of club or organization: Not on file    Attends meetings of clubs or organizations: Not on file    Relationship status: Not on file  . Intimate partner violence:    Fear of current or ex partner: Not on file    Emotionally abused: Not on file    Physically abused: Not on file    Forced sexual  activity: Not on file  Other Topics Concern  . Not on file  Social History Narrative  . Not on file     Constitutional: Denies fever, malaise, fatigue, headache or abrupt weight changes.  Respiratory: Denies difficulty breathing, shortness of breath, cough or sputum production.   Cardiovascular: Denies chest pain, chest tightness, palpitations or swelling in the hands or feet.   No other specific complaints in a complete review of systems (except as listed in HPI above).     Objective:   Physical Exam   BP 124/72   Pulse 72   Temp 99 F (37.2 C) (Oral)   Ht 5\' 2"  (1.575 m)   Wt 221 lb 4 oz (100.4 kg)   BMI 40.47 kg/m  Wt Readings from Last 3 Encounters:  11/28/17 221 lb 4 oz (100.4 kg)  08/18/17 200 lb (90.7 kg)  07/08/17 195 lb (88.5 kg)    General: Appears her stated age, obese, in NAD. Cardiovascular: Normal  rate and rhythm. S1,S2 noted.  No murmur, rubs or gallops noted.  Pulmonary/Chest: Normal effort and positive vesicular breath sounds. No respiratory distress. No wheezes, rales or ronchi noted.  Neurological: Alert and oriented.    BMET    Component Value Date/Time   NA 138 11/14/2014 1847   NA 141 12/02/2013 1607   K 3.4 (L) 11/14/2014 1847   K 3.4 (L) 12/02/2013 1607   CL 102 11/14/2014 1847   CL 106 12/02/2013 1607   CO2 27 11/14/2014 1847   CO2 26 12/02/2013 1607   GLUCOSE 89 11/14/2014 1847   GLUCOSE 105 (H) 12/02/2013 1607   BUN 13 11/14/2014 1847   BUN 12 12/02/2013 1607   CREATININE 0.73 11/14/2014 1847   CREATININE 0.77 12/02/2013 1607   CALCIUM 9.4 11/14/2014 1847   CALCIUM 8.6 12/02/2013 1607   GFRNONAA >60 11/14/2014 1847   GFRNONAA >60 12/02/2013 1607   GFRNONAA >60 10/16/2013 1459   GFRAA >60 11/14/2014 1847   GFRAA >60 12/02/2013 1607   GFRAA >60 10/16/2013 1459    Lipid Panel  No results found for: CHOL, TRIG, HDL, CHOLHDL, VLDL, LDLCALC  CBC    Component Value Date/Time   WBC 8.0 11/14/2014 1847   RBC 4.80 11/14/2014 1847   HGB 12.9 11/14/2014 1847   HGB 12.3 12/02/2013 1607   HCT 39.1 11/14/2014 1847   HCT 38.6 12/02/2013 1607   PLT 320 11/14/2014 1847   PLT 326 12/02/2013 1607   MCV 81.6 11/14/2014 1847   MCV 85 12/02/2013 1607   MCH 26.8 11/14/2014 1847   MCHC 32.9 11/14/2014 1847   RDW 14.6 (H) 11/14/2014 1847   RDW 13.7 12/02/2013 1607   LYMPHSABS 2.0 11/05/2013 1720   LYMPHSABS 2.4 10/16/2013 1459   MONOABS 0.4 11/05/2013 1720   MONOABS 0.6 10/16/2013 1459   EOSABS 0.1 11/05/2013 1720   EOSABS 0.1 10/16/2013 1459   BASOSABS 0.0 11/05/2013 1720   BASOSABS 0.0 10/16/2013 1459    Hgb A1C No results found for: HGBA1C         Assessment & Plan:   Abnormal Weight Gain, Encounter for Form Completion:  Will trial Phentermine, eRx sent through escribe Encouraged high protein, low carb diet, 150 minutes of moderate intensity  exercise per week Form completed, original given back to patient  RTC in 1 month for weight check/med refill Nicki Reaper, NP

## 2018-01-13 ENCOUNTER — Other Ambulatory Visit: Payer: Self-pay | Admitting: General Surgery

## 2018-01-13 DIAGNOSIS — K219 Gastro-esophageal reflux disease without esophagitis: Secondary | ICD-10-CM

## 2018-01-23 ENCOUNTER — Ambulatory Visit
Admission: RE | Admit: 2018-01-23 | Discharge: 2018-01-23 | Disposition: A | Discharge status: Home / Self Care | Payer: Managed Care, Other (non HMO) | Source: Ambulatory Visit | Attending: General Surgery | Admitting: General Surgery

## 2018-01-23 DIAGNOSIS — K219 Gastro-esophageal reflux disease without esophagitis: Secondary | ICD-10-CM

## 2018-02-06 ENCOUNTER — Encounter: Payer: Self-pay | Admitting: Dietician

## 2018-02-06 ENCOUNTER — Encounter: Payer: Managed Care, Other (non HMO) | Attending: General Surgery | Admitting: Dietician

## 2018-02-06 VITALS — Ht 62.0 in | Wt 223.1 lb

## 2018-02-06 DIAGNOSIS — Z6841 Body Mass Index (BMI) 40.0 and over, adult: Secondary | ICD-10-CM

## 2018-02-06 DIAGNOSIS — E669 Obesity, unspecified: Secondary | ICD-10-CM | POA: Insufficient documentation

## 2018-02-06 NOTE — Progress Notes (Signed)
Nutrition Assessment  Proposed Surgery: sleeve gastrectomy  MD: Gaynelle AduEric Wilson RD: Connye BurkittPam Ingram  Height: 5'2" Weight: 223.1lbs BMI: 40.8 Upper IBW% (UIBW): 184% (IBW 121lbs)  Patient's Goal Weight: 150  Medical History: GERD, PCOS Medications and Supplements: levonorgestrel, phentermine  Previous surgeries: C-section Drug allergies: none known Food allergies: none known Alcohol use: none  Tobacco use: none  Physical activity: walking (with dog) 30 minutes daily  Weight history: Childhood: normal weight    Adolescence: overweight since pre-teen     Adulthood: overweight    Weight 1 year ago: 170s before regaining  Dieting/ weight loss history: 2018 lost to about 170lbs using phentermine, maintained weight loss for 6 months after stopping the med, then regained weight quickly. In the past has lost 20lbs with weight watchers. Also tried Metformin and another weight loss medication. Worked with Systems analystpersonal trainer for 6 months, but hit a weight loss plateau and stopped the exercise. Also has tried [employer] Omaha[employer]eto diet which did not work.   Dietary Recall:  Daily pattern: 3 meals and 2-4 snacks. Dining out: 2 meals per week. Breakfast: fruit + bagel, or nutrigrain bar Lunch: leftovers from previous dinner Supper: beef tips, broccoli; soup and salad; [place] burger; hot dogs; sandwich; pork chop + veg.  Snack(s): 5x daily-- nutritgrain bar, peanuts, chips, small bag popcorn, occasional yogurt Beverages: water, flavored water, sparkling water, apple juice  Psychosocial: Emotional eating history: none Disordered eating history: none Other: none  Intervention:  Patient has researched this procedure by speaking with friends and family who have undergone weight loss surgery, including mother.   Instructed her on pre-op diet guidelines, including pre-op diet.   Discussed stages of the bariatric diet after surgery as well as the importance of adequate protein and fluid intake.   Summary:  Patient has  made diet and lifestyle changes in effort to lose weight and prepare for bariatric surgery. She understands that bariatric surgery requires permanent lifestyle changes.  She has solid support from family and friends.   She agrees to work on reducing sugar and carb intake, and eating more slowly prior to surgery.   She is motivated to follow the bariatric diet after surgery. From a nutrition standpoint, she is ready to proceed with the bariatric surgery program.    Plan:  Patient commits to returning for pre-op class visit prior to surgery.   She will plan to return for post-op RD visits beginning 2 weeks after surgery.

## 2018-02-06 NOTE — Patient Instructions (Signed)
   Gradually reduce juices and carbonated drinks to reduce sugar and limit air in stomach.   Work on eating and chewing foods more slowly. Limit other distractions as much as possible when eating.   Continue making healthy food choices and staying active, good job!

## 2018-02-09 ENCOUNTER — Ambulatory Visit: Payer: Self-pay | Admitting: Dietician

## 2018-02-22 ENCOUNTER — Telehealth: Payer: Self-pay | Admitting: Internal Medicine

## 2018-02-22 NOTE — Telephone Encounter (Signed)
Pt need an order for labs to check for nicotine due to her insurance. Please call pt because she may also need a letter stating she has never been a smoker.

## 2018-02-22 NOTE — Telephone Encounter (Signed)
This is done as part of a biometric screening for work. She has not had an annual exam in over a year. She needs to make an appt with me for annual exam, will check nicotene level at that time.

## 2018-02-23 ENCOUNTER — Ambulatory Visit: Payer: 59 | Admitting: Psychology

## 2018-02-24 ENCOUNTER — Ambulatory Visit: Payer: Self-pay

## 2018-02-27 NOTE — Telephone Encounter (Signed)
Someone answered, but then hung up...Marland Kitchen pt just needs to schedule CPE

## 2018-03-01 ENCOUNTER — Telehealth: Payer: Self-pay | Admitting: Dietician

## 2018-03-01 NOTE — Telephone Encounter (Signed)
Called patient to reschedule pre-op class, which was missed on 02/24/18. Patient was unable to find correct contact information to contact this office, but states she had a death in the family so was unable to attend the class. She would like to reschedule to the soonest possible time for a class. Will obtain information on schedule for [facility] office and call patient back.

## 2018-03-01 NOTE — Telephone Encounter (Signed)
Called Regina Chambers again after speaking with [facility] office regarding scheduling a pre-op class. New policy in [place] is to schedule pre-op class only after patient has a surgery date. Regina Chambers received a call from that office to inform her of the policy.  Regina Chambers will call her surgeon's office to work on getting a surgery date and then call back to schedule her pre-op class.

## 2018-03-02 ENCOUNTER — Ambulatory Visit: Payer: 59 | Admitting: Psychology

## 2018-03-09 ENCOUNTER — Ambulatory Visit: Payer: 59 | Admitting: Psychology

## 2018-03-09 DIAGNOSIS — F509 Eating disorder, unspecified: Secondary | ICD-10-CM

## 2018-03-13 ENCOUNTER — Encounter: Payer: Managed Care, Other (non HMO) | Attending: General Surgery | Admitting: Skilled Nursing Facility1

## 2018-03-13 DIAGNOSIS — E669 Obesity, unspecified: Secondary | ICD-10-CM

## 2018-03-14 ENCOUNTER — Encounter: Payer: Self-pay | Admitting: Skilled Nursing Facility1

## 2018-03-14 NOTE — Progress Notes (Signed)
Pre-Operative Nutrition Class:  Appt start time: 4830   End time:  1830.  Patient was seen on 03/13/2018 for Pre-Operative Bariatric Surgery Education at [facility].   Surgery date:  Surgery type: sleeve Start weight at [facility]: 223.1 Weight today: 224.2  Samples given per MNT protocol. Patient educated on appropriate usage: Bariatric Advantage Multivitamin Lot #  [id number] Exp: 4/21  Bariatric Advantage Calcium  Lot # 06/11/2018 Exp: Jun 11, 2018  Premier Protein Shake Lot # [id number] Exp: march/2020  The following the learning objectives were met by the patient during this course:  Identify Pre-Op Dietary Goals and will begin 2 weeks pre-operatively  Identify appropriate sources of fluids and proteins   State protein recommendations and appropriate sources pre and post-operatively  Identify Post-Operative Dietary Goals and will follow for 2 weeks post-operatively  Identify appropriate multivitamin and calcium sources  Describe the need for physical activity post-operatively and will follow MD recommendations  State when to call healthcare provider regarding medication questions or post-operative complications  Handouts given during class include:  Pre-Op Bariatric Surgery Diet Handout  Protein Shake Handout  Post-Op Bariatric Surgery Nutrition Handout  BELT Program Information Flyer  Support Group Information Flyer  [facility] Bariatric Supplements Price List  Follow-Up Plan: Patient will follow-up at [facility] 2 weeks post operatively for diet advancement per MD.

## 2018-03-16 ENCOUNTER — Ambulatory Visit (INDEPENDENT_AMBULATORY_CARE_PROVIDER_SITE_OTHER): Payer: 59 | Admitting: Psychology

## 2018-03-16 DIAGNOSIS — F509 Eating disorder, unspecified: Secondary | ICD-10-CM

## 2018-04-04 ENCOUNTER — Ambulatory Visit: Payer: Self-pay | Admitting: General Surgery

## 2018-04-04 NOTE — H&P (View-Only) (Signed)
Regina Chambers Documented: 04/04/2018 3:44 PM Location: [place] Office Patient #: [id number] DOB: 03/15/1990 Married / Language: English / Race: Black or African American Female  History of Present Illness ([name] [name] MD; 04/04/2018 4:10 PM) The patient is a 28 year old female who presents for a bariatric surgery evaluation. she comes in today for a preoperative visit. She denies any medical changes since I last saw her. She denies any trips emergency room, Hospital or surgeries. She denies any chest pain, chest pressure, shortness of breath, dyspnea on exertion, chest tightness. She still has some occasional heartburn.  Her bariatric evaluation labs were unremarkable except for hemoglobin A1c of 5.6, LDL level 111  Her upper GI showed some reflux and a small hiatal hernia  She is referred by Dr Staebler for consideration of weight loss surgery. The patient completed our online seminar. She is interested in sleeve gastrectomy. She has several acquaintances who have undergone weight loss surgery including her mother. She has done research on her own as well. She is interested in improving her overall health as well as preventing diseases that are common in her family such as diabetes, heart disease, and hypertension. Despite numerous attempts were sustained weight loss she has been unsuccessful. She has tried Atkins on several occasions, low-calorie diets, protein shakes, topiramate, and phentermine-all without any long-term success. She is also done some supervised weight loss with a physician.  Her comorbidities include gastroesophageal reflux disease, PCOS, family history of heart disease, prediabetes, and central low back pain with intermittent sciatica to the left thigh  She denies any chest pain, chest pressure, shortness of breath, orthopnea, paroxysmal nocturnal dyspnea, TIAs or amaurosis fugax, peripheral edema, personal family history of blood clots. She does endorse  some occasional heartburn. She does not regurgitate. She denies any abdominal pain. She reports a daily bowel movement. She denies any melena or hematochezia. She has had a laparoscopic cholecystectomy as well as a C-section. She denies any dysuria or hematuria. She does have a history of PCOS. She does endorse some central low back pain with occasional radiation to her left thigh. She states that she has prediabetes. She denies any blurry vision. She has some occasional mild headaches. She denies any tobacco or drugs. She drinks one to 2 glasses of wine per week. She works for [employer]   Problem List/Past Medical ([name] [name], MD; 04/04/2018 4:12 PM) PCOS (POLYCYSTIC OVARIAN SYNDROME) (E28.2) FAMILY HISTORY OF HEART DISEASE (Z82.49) HIATAL HERNIA (K44.9) MORBID OBESITY (E66.01) I believe the patient meets weight loss surgery criteria. She has a BMI of 40 and has done extensive research. PREDIABETES (R73.03) ELEVATED LDL CHOLESTEROL LEVEL (E78.00)  Past Surgical History ([name] [name], MD; 04/04/2018 4:12 PM) Cesarean Section - 1 Gallbladder Surgery - Laparoscopic  Diagnostic Studies History ([name] [name], MD; 04/04/2018 4:12 PM) Colonoscopy never Mammogram 1-3 years ago  Allergies ([name], RMA; 04/04/2018 3:46 PM) No Known Drug Allergies [10/18/2017]: Allergies Reconciled  Medication History ([name], RMA; 04/04/2018 3:46 PM) Escitalopram Oxalate (10MG Tablet, Oral) Active. Lexapro (10MG Tablet, Oral) Active. Medications Reconciled  Social History ([name] [name], MD; 04/04/2018 4:12 PM) Alcohol use Occasional alcohol use. No caffeine use No drug use Tobacco use Never smoker.  Family History ([name] [name], MD; 04/04/2018 4:12 PM) Diabetes Mellitus Brother, Father. Heart Disease Father, Mother. Heart disease in female family member before age 65 Heart disease in female family member before age 55 Hypertension Father,  Mother. Kidney Disease Father. Thyroid   problems Father.  Pregnancy / Birth History [name] [name], MD; 04/04/2018 4:12 PM) Age at menarche 9 years. Contraceptive History Intrauterine device. Gravida 2 Irregular periods Maternal age 33-25 Para 1  Other Problems [name] [name], MD; 04/04/2018 4:12 PM) Anxiety Disorder Cholelithiasis BACK PAIN, LUMBOSACRAL (M54.5) GASTROESOPHAGEAL REFLUX DISEASE, ESOPHAGITIS PRESENCE NOT SPECIFIED (K21.9)     Review of Systems [name] [name] MD; 04/04/2018 4:10 PM) General Present- Weight Gain. Not Present- Appetite Loss, Chills, Fatigue, Fever, Night Sweats and Weight Loss. All other systems negative  Vitals ([name] RMA; 04/04/2018 3:46 PM) 04/04/2018 3:45 PM Weight: 228 lb Height: 62in Body Surface Area: 2.02 m Body Mass Index: 41.7 kg/m  Temp.: 98.83F  Pulse: 84 (Regular)  BP: 128/88 (Sitting, Left Arm, Standard)      Physical Exam [name] [name] MD; 04/04/2018 4:10 PM)  General Mental Status-Alert. General Appearance-Consistent with stated age. Hydration-Well hydrated. Voice-Normal. Note: severe obesity, distributed  Head and Neck Head-normocephalic, atraumatic with no lesions or palpable masses. Trachea-midline. Thyroid Gland Characteristics - normal size and consistency.  Eye Eyeball - Bilateral-Extraocular movements intact. Sclera/Conjunctiva - Bilateral-No scleral icterus.  ENMT Ears -Note:normal ext ears.  Mouth and Throat -Note:lips intact.   Chest and Lung Exam Chest and lung exam reveals -quiet, even and easy respiratory effort with no use of accessory muscles and on auscultation, normal breath sounds, no adventitious sounds and normal vocal resonance. Inspection Chest Wall - Normal. Back - normal.  Breast - Did not examine.  Cardiovascular Cardiovascular examination reveals -normal heart sounds, regular rate and rhythm with no murmurs and  normal pedal pulses bilaterally.  Abdomen Inspection Inspection of the abdomen reveals - No Hernias. Skin - Scar - Note: well healed trocar scars and old c/s scar. Palpation/Percussion Palpation and Percussion of the abdomen reveal - Soft, Non Tender, No Rebound tenderness, No Rigidity (guarding) and No hepatosplenomegaly. Auscultation Auscultation of the abdomen reveals - Bowel sounds normal.  Peripheral Vascular Upper Extremity Palpation - Pulses bilaterally normal.  Neurologic Neurologic evaluation reveals -alert and oriented x 3 with no impairment of recent or remote memory. Mental Status-Normal.  Neuropsychiatric The patient's mood and affect are described as -normal. Judgment and Insight-insight is appropriate concerning matters relevant to self.  Musculoskeletal Normal Exam - Left-Upper Extremity Strength Normal and Lower Extremity Strength Normal. Normal Exam - Right-Upper Extremity Strength Normal and Lower Extremity Strength Normal. Note: bilateral knee crepitus  Lymphatic Head & Neck  General Head & Neck Lymphatics: Bilateral - Description - Normal. Axillary - Did not examine. Femoral & Inguinal - Did not examine.    Assessment & Plan [name] Shaketa Serafin MD; 04/04/2018 4:11 PM)  MORBID OBESITY (E66.01) Story: I believe the patient meets weight loss surgery criteria. She has a BMI of 40 and has done extensive research. Impression: The patient meets weight loss surgery criteria. I think the patient would be an acceptable candidate for Laparoscopic vertical sleeve gastrectomy.  we reviewed her preoperative workup. We discussed her lab results. we discussed the findings of a small hiatal hernia on upper GI. We discussed hiatal hernia. I advised her that we would tests for one intraoperatively and found to have a clinically significant one I recommended repair. We discussed what that would involve.  She has attended her preoperative education class. We  discussed the typical issues that we see after surgery. All of her questions were asked and answered.  Current Plans Pt Education - EMW_preopbariatric  PCOS (POLYCYSTIC OVARIAN SYNDROME) (E28.2)   FAMILY HISTORY  OF HEART DISEASE (Z82.49)   PREDIABETES (R73.03)   ELEVATED LDL CHOLESTEROL LEVEL (E78.00)   HIATAL HERNIA (K44.9)  Mary Sella. [name], MD, FACS General, Bariatric, & Minimally Invasive Surgery [facility], [place]

## 2018-04-04 NOTE — H&P (Signed)
Regina Chambers Documented: 04/04/2018 3:44 PM Location: [place] Office Patient #: [id number] DOB: 1990/02/14 Married / Language: English / Race: Black or African American Female  History of Present Illness Regina Areola M. Jazz Rogala MD; 04/04/2018 4:10 PM) The patient is a 28 year old female who presents for a bariatric surgery evaluation. she comes in today for a preoperative visit. She denies any medical changes since I last saw her. She denies any trips emergency room, Hospital or surgeries. She denies any chest pain, chest pressure, shortness of breath, dyspnea on exertion, chest tightness. She still has some occasional heartburn.  Her bariatric evaluation labs were unremarkable except for hemoglobin A1c of 5.6, LDL level 111  Her upper GI showed some reflux and a small hiatal hernia  She is referred by Dr Bonney Aid for consideration of weight loss surgery. The patient completed our online seminar. She is interested in sleeve gastrectomy. She has several acquaintances who have undergone weight loss surgery including her mother. She has done research on her own as well. She is interested in improving her overall health as well as preventing diseases that are common in her family such as diabetes, heart disease, and hypertension. Despite numerous attempts were sustained weight loss she has been unsuccessful. She has tried Atkins on several occasions, low-calorie diets, protein shakes, topiramate, and phentermine-all without any long-term success. She is also done some supervised weight loss with a physician.  Her comorbidities include gastroesophageal reflux disease, PCOS, family history of heart disease, prediabetes, and central low back pain with intermittent sciatica to the left thigh  She denies any chest pain, chest pressure, shortness of breath, orthopnea, paroxysmal nocturnal dyspnea, TIAs or amaurosis fugax, peripheral edema, personal family history of blood clots. She does endorse  some occasional heartburn. She does not regurgitate. She denies any abdominal pain. She reports a daily bowel movement. She denies any melena or hematochezia. She has had a laparoscopic cholecystectomy as well as a C-section. She denies any dysuria or hematuria. She does have a history of PCOS. She does endorse some central low back pain with occasional radiation to her left thigh. She states that she has prediabetes. She denies any blurry vision. She has some occasional mild headaches. She denies any tobacco or drugs. She drinks one to 2 glasses of wine per week. She works for [employer]   Paramedic Regina Areola M. [name], MD; 04/04/2018 4:12 PM) PCOS (POLYCYSTIC OVARIAN SYNDROME) (E28.2) FAMILY HISTORY OF HEART DISEASE (Z82.49) HIATAL HERNIA (K44.9) MORBID OBESITY (E66.01) I believe the patient meets weight loss surgery criteria. She has a BMI of 40 and has done extensive research. PREDIABETES (R73.03) ELEVATED LDL CHOLESTEROL LEVEL (E78.00)  Past Surgical History Regina Areola M. [name], MD; 04/04/2018 4:12 PM) Cesarean Section - 1 Gallbladder Surgery - Laparoscopic  Diagnostic Studies History Regina Areola M. [name], MD; 04/04/2018 4:12 PM) Colonoscopy never Mammogram 1-3 years ago  Allergies (Tanisha A. Manson Passey, RMA; 04/04/2018 3:46 PM) No Known Drug Allergies [10/18/2017]: Allergies Reconciled  Medication History (Tanisha A. Manson Passey, RMA; 04/04/2018 3:46 PM) Escitalopram Oxalate (  Tablet, Oral) Active. Lexapro (  Tablet, Oral) Active. Medications Reconciled  Social History Regina Areola M. [name], MD; 04/04/2018 4:12 PM) Alcohol use Occasional alcohol use. No caffeine use No drug use Tobacco use Never smoker.  Family History Regina Areola M. [name], MD; 04/04/2018 4:12 PM) Diabetes Mellitus Brother, Father. Heart Disease Father, Mother. Heart disease in female family member before age 46 Heart disease in female family member before age 62 Hypertension Father,  Mother. Kidney Disease Father. Thyroid  problems Father.  Pregnancy / Birth History Regina Areola M. [name], MD; 04/04/2018 4:12 PM) Age at menarche 9 years. Contraceptive History Intrauterine device. Gravida 2 Irregular periods Maternal age 33-25 Para 1  Other Problems Regina Areola M. [name], MD; 04/04/2018 4:12 PM) Anxiety Disorder Cholelithiasis BACK PAIN, LUMBOSACRAL (M54.5) GASTROESOPHAGEAL REFLUX DISEASE, ESOPHAGITIS PRESENCE NOT SPECIFIED (K21.9)     Review of Systems Regina Areola M. Aisling Emigh MD; 04/04/2018 4:10 PM) General Present- Weight Gain. Not Present- Appetite Loss, Chills, Fatigue, Fever, Night Sweats and Weight Loss. All other systems negative  Vitals ([name] RMA; 04/04/2018 3:46 PM) 04/04/2018 3:45 PM Weight: 228 lb Height: 62in Body Surface Area: 2.02 m Body Mass Index: 41.7 kg/m  Temp.: 98.83F  Pulse: 84 (Regular)  BP: 128/88 (Sitting, Left Arm, Standard)      Physical Exam Regina Areola M. Ples Trudel MD; 04/04/2018 4:10 PM)  General Mental Status-Alert. General Appearance-Consistent with stated age. Hydration-Well hydrated. Voice-Normal. Note: severe obesity, distributed  Head and Neck Head-normocephalic, atraumatic with no lesions or palpable masses. Trachea-midline. Thyroid Gland Characteristics - normal size and consistency.  Eye Eyeball - Bilateral-Extraocular movements intact. Sclera/Conjunctiva - Bilateral-No scleral icterus.  ENMT Ears -Note:normal ext ears.  Mouth and Throat -Note:lips intact.   Chest and Lung Exam Chest and lung exam reveals -quiet, even and easy respiratory effort with no use of accessory muscles and on auscultation, normal breath sounds, no adventitious sounds and normal vocal resonance. Inspection Chest Wall - Normal. Back - normal.  Breast - Did not examine.  Cardiovascular Cardiovascular examination reveals -normal heart sounds, regular rate and rhythm with no murmurs and  normal pedal pulses bilaterally.  Abdomen Inspection Inspection of the abdomen reveals - No Hernias. Skin - Scar - Note: well healed trocar scars and old c/s scar. Palpation/Percussion Palpation and Percussion of the abdomen reveal - Soft, Non Tender, No Rebound tenderness, No Rigidity (guarding) and No hepatosplenomegaly. Auscultation Auscultation of the abdomen reveals - Bowel sounds normal.  Peripheral Vascular Upper Extremity Palpation - Pulses bilaterally normal.  Neurologic Neurologic evaluation reveals -alert and oriented x 3 with no impairment of recent or remote memory. Mental Status-Normal.  Neuropsychiatric The patient's mood and affect are described as -normal. Judgment and Insight-insight is appropriate concerning matters relevant to self.  Musculoskeletal Normal Exam - Left-Upper Extremity Strength Normal and Lower Extremity Strength Normal. Normal Exam - Right-Upper Extremity Strength Normal and Lower Extremity Strength Normal. Note: bilateral knee crepitus  Lymphatic Head & Neck  General Head & Neck Lymphatics: Bilateral - Description - Normal. Axillary - Did not examine. Femoral & Inguinal - Did not examine.    Assessment & Plan Regina Areola M. Raad Clayson MD; 04/04/2018 4:11 PM)  MORBID OBESITY (E66.01) Story: I believe the patient meets weight loss surgery criteria. She has a BMI of 40 and has done extensive research. Impression: The patient meets weight loss surgery criteria. I think the patient would be an acceptable candidate for Laparoscopic vertical sleeve gastrectomy.  we reviewed her preoperative workup. We discussed her lab results. we discussed the findings of a small hiatal hernia on upper GI. We discussed hiatal hernia. I advised her that we would tests for one intraoperatively and found to have a clinically significant one I recommended repair. We discussed what that would involve.  She has attended her preoperative education class. We  discussed the typical issues that we see after surgery. All of her questions were asked and answered.  Current Plans Pt Education - EMW_preopbariatric  PCOS (POLYCYSTIC OVARIAN SYNDROME) (E28.2)   FAMILY HISTORY  OF HEART DISEASE (Z82.49)   PREDIABETES (R73.03)   ELEVATED LDL CHOLESTEROL LEVEL (E78.00)   HIATAL HERNIA (K44.9)  Mary Sella. [name], MD, FACS General, Bariatric, & Minimally Invasive Surgery [facility], [place]

## 2018-04-10 ENCOUNTER — Ambulatory Visit: Payer: Self-pay | Admitting: General Surgery

## 2018-04-10 NOTE — Progress Notes (Signed)
Please place orders in Epic as patient is being scheduled for a pre-op appointment! Thank you! 

## 2018-04-17 NOTE — Patient Instructions (Addendum)
Regina Chambers  04/17/2018   Your procedure is scheduled on: Tuesday 04/25/2018  Report to [facility] Main  Entrance              Report to admitting at  0900   AM    Call this number if you have problems the morning of surgery [phone]    Remember: Do not eat food :After  6 pm  MORNING OF SURGERY DRINK:  1 SHAKE  ( G2 LOWER SUGAR GATORADE)  BEFORE YOU LEAVE HOME, DRINK ALL OF THE SHAKE AT ONE TIME.   NO SOLID FOOD AFTER 600 PM THE NIGHT BEFORE YOUR SURGERY. YOU MAY DRINK CLEAR FLUIDS. DRINK THE SHAKE  BEFORE YOU LEAVE HOME WILL BE THE LAST FLUIDS YOU DRINK BEFORE SURGERY.    CLEAR LIQUID DIET   Foods Allowed                                                                     Foods Excluded  Coffee and tea, regular and decaf                             liquids that you cannot  Plain Jell-O in any flavor                                             see through such as: Fruit ices (not with fruit pulp)                                     milk, soups, orange juice  Iced Popsicles                                    All solid food Carbonated beverages, regular and diet                                    Cranberry, grape and apple juices Sports drinks like Gatorade Lightly seasoned clear broth or consume(fat free) Sugar, honey syrup  Sample Menu Breakfast                                Lunch                                     Supper Cranberry juice                    Beef broth                            Chicken broth Jell-O  Grape juice                           Apple juice Coffee or tea                        Jell-O                                      Popsicle                                                Coffee or tea                        Coffee or tea  _____________________________________________________________________   PAIN IS EXPECTED AFTER SURGERY AND WILL NOT BE COMPLETELY ELIMINATED. AMBULATION  AND TYLENOL WILL HELP REDUCE INCISIONAL AND GAS PAIN. MOVEMENT IS KEY!  YOU ARE EXPECTED TO BE OUT OF BED WITHIN 4 HOURS OF ADMISSION TO YOUR PATIENT ROOM.  SITTING IN THE RECLINER THROUGHOUT THE DAY IS IMPORTANT FOR DRINKING FLUIDS AND MOVING GAS THROUGHOUT THE GI TRACT.  COMPRESSION STOCKINGS SHOULD BE WORN [facility] STAY UNLESS YOU ARE WALKING.   INCENTIVE SPIROMETER SHOULD BE USED EVERY HOUR WHILE AWAKE TO DECREASE POST-OPERATIVE COMPLICATIONS SUCH AS PNEUMONIA.  WHEN DISCHARGED HOME, IT IS IMPORTANT TO CONTINUE TO WALK EVERY HOUR AND USE THE INCENTIVE SPIROMETER EVERY HOUR.         BRUSH YOUR TEETH MORNING OF SURGERY AND RINSE YOUR MOUTH OUT, NO CHEWING GUM CANDY OR MINTS.     Take these medicines the morning of surgery with A SIP OF WATER: none                                  You may not have any metal on your body including hair pins and              piercings  Do not wear jewelry, make-up, lotions, powders or perfumes, deodorant             Do not wear nail polish.  Do not shave  48 hours prior to surgery.              Do not bring valuables to the Chambers.  IS NOT             RESPONSIBLE   FOR VALUABLES.  Contacts, dentures or bridgework may not be worn into surgery.  Leave suitcase in the car. After surgery it may be brought to your room.                  Please read over the following fact sheets you were given: _____________________________________________________________________             Regina Chambers - Preparing for Surgery Before surgery, you can play an important role.  Because skin is not sterile, your skin needs to be as free of germs as possible.  You can reduce the number of germs on your skin by washing with CHG (chlorahexidine gluconate) soap before surgery.  CHG is an antiseptic cleaner which kills germs and bonds with  the skin to continue killing germs even after washing. Please DO NOT use if you have an allergy to CHG  or antibacterial soaps.  If your skin becomes reddened/irritated stop using the CHG and inform your nurse when you arrive at Short Stay. Do not shave (including legs and underarms) for at least 48 hours prior to the first CHG shower.  You may shave your face/neck. Please follow these instructions carefully:  1.  Shower with CHG Soap the night before surgery and the  morning of Surgery.  2.  If you choose to wash your hair, wash your hair first as usual with your  normal  shampoo.  3.  After you shampoo, rinse your hair and body thoroughly to remove the  shampoo.                           4.  Use CHG as you would any other liquid soap.  You can apply chg directly  to the skin and wash                       Gently with a scrungie or clean washcloth.  5.  Apply the CHG Soap to your body ONLY FROM THE NECK DOWN.   Do not use on face/ open                           Wound or open sores. Avoid contact with eyes, ears mouth and genitals (private parts).                       Wash face,  Genitals (private parts) with your normal soap.             6.  Wash thoroughly, paying special attention to the area where your surgery  will be performed.  7.  Thoroughly rinse your body with warm water from the neck down.  8.  DO NOT shower/wash with your normal soap after using and rinsing off  the CHG Soap.                9.  Pat yourself dry with a clean towel.            10.  Wear clean pajamas.            11.  Place clean sheets on your bed the night of your first shower and do not  sleep with pets. Day of Surgery : Do not apply any lotions/deodorants the morning of surgery.  Please wear clean clothes to the [facility]/surgery center.  FAILURE TO FOLLOW THESE INSTRUCTIONS MAY RESULT IN THE CANCELLATION OF YOUR SURGERY PATIENT SIGNATURE_________________________________  NURSE SIGNATURE__________________________________  ________________________________________________________________________   [name]  An incentive spirometer is a tool that can help keep your lungs clear and active. This tool measures how well you are filling your lungs with each breath. Taking long deep breaths may help reverse or decrease the chance of developing breathing (pulmonary) problems (especially infection) following:  A long period of time when you are unable to move or be active. BEFORE THE PROCEDURE   If the spirometer includes an indicator to show your best effort, your nurse or respiratory therapist will set it to a desired goal.  If possible, sit up straight or lean slightly forward. Try not to slouch.  Hold the incentive spirometer in an upright position. INSTRUCTIONS FOR  USE  1. Sit on the edge of your bed if possible, or sit up as far as you can in bed or on a chair. 2. Hold the incentive spirometer in an upright position. 3. Breathe out normally. 4. Place the mouthpiece in your mouth and seal your lips tightly around it. 5. Breathe in slowly and as deeply as possible, raising the piston or the ball toward the top of the column. 6. Hold your breath for 3-5 seconds or for as long as possible. Allow the piston or ball to fall to the bottom of the column. 7. Remove the mouthpiece from your mouth and breathe out normally. 8. Rest for a few seconds and repeat Steps 1 through 7 at least 10 times every 1-2 hours when you are awake. Take your time and take a few normal breaths between deep breaths. 9. The spirometer may include an indicator to show your best effort. Use the indicator as a goal to work toward during each repetition. 10. After each set of 10 deep breaths, practice coughing to be sure your lungs are clear. If you have an incision (the cut made at the time of surgery), support your incision when coughing by placing a pillow or rolled up towels firmly against it. Once you are able to get out of bed, walk around indoors and cough well. You may stop using the incentive spirometer when  instructed by your caregiver.  RISKS AND COMPLICATIONS  Take your time so you do not get dizzy or light-headed.  If you are in pain, you may need to take or ask for pain medication before doing incentive spirometry. It is harder to take a deep breath if you are having pain. AFTER USE  Rest and breathe slowly and easily.  It can be helpful to keep track of a log of your progress. Your caregiver can provide you with a simple table to help with this. If you are using the spirometer at home, follow these instructions: SEEK MEDICAL CARE IF:   You are having difficultly using the spirometer.  You have trouble using the spirometer as often as instructed.  Your pain medication is not giving enough relief while using the spirometer.  You develop fever of 100.5 F (38.1 C) or higher. SEEK IMMEDIATE MEDICAL CARE IF:   You cough up bloody sputum that had not been present before.  You develop fever of 102 F (38.9 C) or greater.  You develop worsening pain at or near the incision site. MAKE SURE YOU:   Understand these instructions.  Will watch your condition.  Will get help right away if you are not doing well or get worse. Document Released: 06/07/2006 Document Revised: 04/19/2011 Document Reviewed: 08/08/2006 Exit[employer] Patient Information 2014 [employer], [place].   ________________________________________________________________________  WHAT IS A BLOOD TRANSFUSION? Blood Transfusion Information  A transfusion is the replacement of blood or some of its parts. Blood is made up of multiple cells which provide different functions.  Red blood cells carry oxygen and are used for blood loss replacement.  White blood cells fight against infection.  Platelets control bleeding.  Plasma helps clot blood.  Other blood products are available for specialized needs, such as hemophilia or other clotting disorders. BEFORE THE TRANSFUSION  Who gives blood for transfusions?   Healthy  volunteers who are fully evaluated to make sure their blood is safe. This is blood bank blood. Transfusion therapy is the safest it has ever been in the practice of medicine. Before blood is taken  from a donor, a complete history is taken to make sure that person has no history of diseases nor engages in risky social behavior (examples are intravenous drug use or sexual activity with multiple partners). The donor's travel history is screened to minimize risk of transmitting infections, such as malaria. The donated blood is tested for signs of infectious diseases, such as HIV and hepatitis. The blood is then tested to be sure it is compatible with you in order to minimize the chance of a transfusion reaction. If you or a relative donates blood, this is often done in anticipation of surgery and is not appropriate for emergency situations. It takes many days to process the donated blood. RISKS AND COMPLICATIONS Although transfusion therapy is very safe and saves many lives, the main dangers of transfusion include:   Getting an infectious disease.  Developing a transfusion reaction. This is an allergic reaction to something in the blood you were given. Every precaution is taken to prevent this. The decision to have a blood transfusion has been considered carefully by your caregiver before blood is given. Blood is not given unless the benefits outweigh the risks. AFTER THE TRANSFUSION  Right after receiving a blood transfusion, you will usually feel much better and more energetic. This is especially true if your red blood cells have gotten low (anemic). The transfusion raises the level of the red blood cells which carry oxygen, and this usually causes an energy increase.  The nurse administering the transfusion will monitor you carefully for complications. HOME CARE INSTRUCTIONS  No special instructions are needed after a transfusion. You may find your energy is better. Speak with your caregiver about any  limitations on activity for underlying diseases you may have. SEEK MEDICAL CARE IF:   Your condition is not improving after your transfusion.  You develop redness or irritation at the intravenous (IV) site. SEEK IMMEDIATE MEDICAL CARE IF:  Any of the following symptoms occur over the next 12 hours:  Shaking chills.  You have a temperature by mouth above 102 F (38.9 C), not controlled by medicine.  Chest, back, or muscle pain.  People around you feel you are not acting correctly or are confused.  Shortness of breath or difficulty breathing.  Dizziness and fainting.  You get a rash or develop hives.  You have a decrease in urine output.  Your urine turns a dark color or changes to pink, red, or brown. Any of the following symptoms occur over the next 10 days:  You have a temperature by mouth above 102 F (38.9 C), not controlled by medicine.  Shortness of breath.  Weakness after normal activity.  The white part of the eye turns yellow (jaundice).  You have a decrease in the amount of urine or are urinating less often.  Your urine turns a dark color or changes to pink, red, or brown. Document Released: 01/23/2000 Document Revised: 04/19/2011 Document Reviewed: 09/11/2007 [facility] Patient Information 2014 [place], [place].  _______________________________________________________________________

## 2018-04-17 NOTE — Progress Notes (Signed)
01/23/2018- noted in Epic-CXR and EKG

## 2018-04-19 ENCOUNTER — Other Ambulatory Visit: Payer: Self-pay

## 2018-04-19 ENCOUNTER — Encounter (HOSPITAL_COMMUNITY): Payer: Managed Care, Other (non HMO)

## 2018-04-19 ENCOUNTER — Encounter (HOSPITAL_COMMUNITY): Payer: Self-pay

## 2018-04-19 ENCOUNTER — Encounter (HOSPITAL_COMMUNITY)
Admission: RE | Admit: 2018-04-19 | Discharge: 2018-04-19 | Disposition: A | Discharge status: Home / Self Care | Payer: Managed Care, Other (non HMO) | Source: Ambulatory Visit | Attending: General Surgery | Admitting: General Surgery

## 2018-04-19 DIAGNOSIS — Z01812 Encounter for preprocedural laboratory examination: Secondary | ICD-10-CM | POA: Diagnosis present

## 2018-04-19 LAB — CBC WITH DIFFERENTIAL/PLATELET
Abs Immature Granulocytes: 0.01 10*3/uL (ref 0.00–0.07)
BASOS ABS: 0 10*3/uL (ref 0.0–0.1)
Basophils Relative: 1 %
Eosinophils Absolute: 0.1 10*3/uL (ref 0.0–0.5)
Eosinophils Relative: 1 %
HCT: 42.6 % (ref 36.0–46.0)
Hemoglobin: 13.5 g/dL (ref 12.0–15.0)
IMMATURE GRANULOCYTES: 0 %
Lymphocytes Relative: 26 %
Lymphs Abs: 1.9 10*3/uL (ref 0.7–4.0)
MCH: 28.4 pg (ref 26.0–34.0)
MCHC: 31.7 g/dL (ref 30.0–36.0)
MCV: 89.5 fL (ref 80.0–100.0)
Monocytes Absolute: 0.6 10*3/uL (ref 0.1–1.0)
Monocytes Relative: 8 %
NRBC: 0 % (ref 0.0–0.2)
Neutro Abs: 4.8 10*3/uL (ref 1.7–7.7)
Neutrophils Relative %: 64 %
Platelets: 303 10*3/uL (ref 150–400)
RBC: 4.76 MIL/uL (ref 3.87–5.11)
RDW: 12.3 % (ref 11.5–15.5)
WBC: 7.4 10*3/uL (ref 4.0–10.5)

## 2018-04-19 LAB — COMPREHENSIVE METABOLIC PANEL
ALT: 17 U/L (ref 0–44)
AST: 16 U/L (ref 15–41)
Albumin: 4 g/dL (ref 3.5–5.0)
Alkaline Phosphatase: 57 U/L (ref 38–126)
Anion gap: 10 (ref 5–15)
BUN: 10 mg/dL (ref 6–20)
CO2: 26 mmol/L (ref 22–32)
Calcium: 9.1 mg/dL (ref 8.9–10.3)
Chloride: 102 mmol/L (ref 98–111)
Creatinine, Ser: 0.71 mg/dL (ref 0.44–1.00)
GFR calc Af Amer: >60 mL/min (ref >60)
GFR calc non Af Amer: >60 mL/min (ref >60)
Glucose, Bld: 104 mg/dL — ABNORMAL HIGH (ref 70–99)
Potassium: 3.8 mmol/L (ref 3.5–5.1)
Sodium: 138 mmol/L (ref 135–145)
Total Bilirubin: 0.5 mg/dL (ref 0.3–1.2)
Total Protein: 7.2 g/dL (ref 6.5–8.1)

## 2018-04-19 LAB — ABO/RH: ABO/RH(D): A POS

## 2018-04-19 MED ORDER — ENSURE PRE-SURGERY PO LIQD: 296.0000 mL | Freq: Once | ORAL | Status: DC

## 2018-04-24 MED ORDER — BUPIVACAINE LIPOSOME 1.3 % IJ SUSP
20.0000 mL | Freq: Once | INTRAMUSCULAR | Status: DC
  Filled 2018-04-24: qty 20

## 2018-04-24 NOTE — Anesthesia Preprocedure Evaluation (Addendum)
Anesthesia Evaluation  Patient identified by MRN, date of birth, ID band Patient awake    Reviewed: Allergy & Precautions, NPO status , Patient's Chart, lab work & pertinent test results  History of Anesthesia Complications Negative for: history of anesthetic complications  Airway Mallampati: II  TM Distance: >3 FB Neck ROM: Full    Dental  (+) Teeth Intact, Dental Advisory Given   Pulmonary neg pulmonary ROS,    Pulmonary exam normal breath sounds clear to auscultation       Cardiovascular negative cardio ROS Normal cardiovascular exam Rhythm:Regular Rate:Normal     Neuro/Psych Anxiety negative neurological ROS     GI/Hepatic Neg liver ROS, GERD  Controlled,  Endo/Other  Morbid obesity  Renal/GU negative Renal ROS     Musculoskeletal negative musculoskeletal ROS (+)   Abdominal   Peds  Hematology negative hematology ROS (+)   Anesthesia Other Findings Day of surgery medications reviewed with the patient.  Reproductive/Obstetrics                            Anesthesia Physical Anesthesia Plan  ASA: III  Anesthesia Plan: General   Post-op Pain Management:    Induction: Intravenous  PONV Risk Score and Plan: 4 or greater and Treatment may vary due to age or medical condition, Ondansetron, Dexamethasone, Midazolam and Scopolamine patch - Pre-op  Airway Management Planned: Oral ETT  Additional Equipment:   Intra-op Plan:   Post-operative Plan: Extubation in OR  Informed Consent: I have reviewed the patients History and Physical, chart, labs and discussed the procedure including the risks, benefits and alternatives for the proposed anesthesia with the patient or authorized representative who has indicated his/her understanding and acceptance.     Dental advisory given  Plan Discussed with: CRNA  Anesthesia Plan Comments:        Anesthesia Quick Evaluation

## 2018-04-25 ENCOUNTER — Encounter (HOSPITAL_COMMUNITY): Payer: Self-pay

## 2018-04-25 ENCOUNTER — Inpatient Hospital Stay (HOSPITAL_COMMUNITY): Payer: Managed Care, Other (non HMO) | Admitting: Physician Assistant

## 2018-04-25 ENCOUNTER — Inpatient Hospital Stay (HOSPITAL_COMMUNITY): Payer: Managed Care, Other (non HMO) | Admitting: Certified Registered"

## 2018-04-25 ENCOUNTER — Encounter (HOSPITAL_COMMUNITY)
Admission: RE | Disposition: A | Discharge status: Home / Self Care | Payer: Self-pay | Source: Home / Self Care | Attending: General Surgery

## 2018-04-25 ENCOUNTER — Inpatient Hospital Stay (HOSPITAL_COMMUNITY)
Admission: RE | Admit: 2018-04-25 | Discharge: 2018-04-26 | DRG: 621 | Disposition: A | Discharge status: Home / Self Care | Payer: Managed Care, Other (non HMO) | Attending: General Surgery | Admitting: General Surgery

## 2018-04-25 ENCOUNTER — Other Ambulatory Visit: Payer: Self-pay

## 2018-04-25 DIAGNOSIS — G8929 Other chronic pain: Secondary | ICD-10-CM | POA: Diagnosis present

## 2018-04-25 DIAGNOSIS — Z9049 Acquired absence of other specified parts of digestive tract: Secondary | ICD-10-CM | POA: Diagnosis not present

## 2018-04-25 DIAGNOSIS — K219 Gastro-esophageal reflux disease without esophagitis: Secondary | ICD-10-CM | POA: Diagnosis present

## 2018-04-25 DIAGNOSIS — Z6841 Body Mass Index (BMI) 40.0 and over, adult: Secondary | ICD-10-CM | POA: Diagnosis not present

## 2018-04-25 DIAGNOSIS — M545 Low back pain: Secondary | ICD-10-CM | POA: Diagnosis present

## 2018-04-25 DIAGNOSIS — F419 Anxiety disorder, unspecified: Secondary | ICD-10-CM | POA: Diagnosis present

## 2018-04-25 DIAGNOSIS — R7303 Prediabetes: Secondary | ICD-10-CM | POA: Diagnosis present

## 2018-04-25 DIAGNOSIS — K449 Diaphragmatic hernia without obstruction or gangrene: Secondary | ICD-10-CM | POA: Diagnosis present

## 2018-04-25 DIAGNOSIS — Z9884 Bariatric surgery status: Secondary | ICD-10-CM

## 2018-04-25 HISTORY — PX: LAPAROSCOPIC GASTRIC SLEEVE RESECTION: SHX5895

## 2018-04-25 HISTORY — PX: UPPER GI ENDOSCOPY: SHX6162

## 2018-04-25 LAB — TYPE AND SCREEN
ABO/RH(D): A POS
Antibody Screen: NEGATIVE

## 2018-04-25 LAB — HEMOGLOBIN AND HEMATOCRIT, BLOOD
HCT: 40.1 % (ref 36.0–46.0)
Hemoglobin: 13.3 g/dL (ref 12.0–15.0)

## 2018-04-25 LAB — PREGNANCY, URINE: Preg Test, Ur: NEGATIVE

## 2018-04-25 SURGERY — GASTRECTOMY, SLEEVE, LAPAROSCOPIC
Anesthesia: General | Site: Abdomen

## 2018-04-25 MED ORDER — ENOXAPARIN SODIUM 30 MG/0.3ML ~~LOC~~ SOLN
30.0000 mg | Freq: Two times a day (BID) | SUBCUTANEOUS | Status: DC
  Administered 2018-04-25 – 2018-04-26 (×2): 30 mg via SUBCUTANEOUS
  Filled 2018-04-25 (×2): qty 0.3

## 2018-04-25 MED ORDER — DEXAMETHASONE SODIUM PHOSPHATE 10 MG/ML IJ SOLN
INTRAMUSCULAR | Status: DC | PRN
  Administered 2018-04-25: 4 mg via INTRAVENOUS

## 2018-04-25 MED ORDER — PHENYLEPHRINE 40 MCG/ML (10ML) SYRINGE FOR IV PUSH (FOR BLOOD PRESSURE SUPPORT)
PREFILLED_SYRINGE | INTRAVENOUS | Status: DC | PRN
  Administered 2018-04-25: 40 ug via INTRAVENOUS

## 2018-04-25 MED ORDER — ENSURE MAX PROTEIN PO LIQD
2.0000 [oz_av] | ORAL | Status: DC
  Administered 2018-04-26 (×2): 2 [oz_av] via ORAL

## 2018-04-25 MED ORDER — FENTANYL CITRATE (PF) 100 MCG/2ML IJ SOLN
INTRAMUSCULAR | Status: AC
  Filled 2018-04-25: qty 2

## 2018-04-25 MED ORDER — ACETAMINOPHEN 10 MG/ML IV SOLN: 1000.0000 mg | Freq: Once | INTRAVENOUS | Status: DC | PRN

## 2018-04-25 MED ORDER — ONDANSETRON HCL 4 MG/2ML IJ SOLN: 4.0000 mg | Freq: Four times a day (QID) | INTRAMUSCULAR | Status: DC | PRN

## 2018-04-25 MED ORDER — PROMETHAZINE HCL 25 MG/ML IJ SOLN
INTRAMUSCULAR | Status: AC
  Filled 2018-04-25: qty 1

## 2018-04-25 MED ORDER — DEXAMETHASONE SODIUM PHOSPHATE 10 MG/ML IJ SOLN
INTRAMUSCULAR | Status: AC
  Filled 2018-04-25: qty 1

## 2018-04-25 MED ORDER — SUGAMMADEX SODIUM 200 MG/2ML IV SOLN
INTRAVENOUS | Status: DC | PRN
  Administered 2018-04-25: 200 mg via INTRAVENOUS

## 2018-04-25 MED ORDER — ACETAMINOPHEN 500 MG PO TABS
1000.0000 mg | ORAL_TABLET | ORAL | Status: AC
  Administered 2018-04-25: 1000 mg via ORAL
  Filled 2018-04-25: qty 2

## 2018-04-25 MED ORDER — SUCCINYLCHOLINE CHLORIDE 200 MG/10ML IV SOSY
PREFILLED_SYRINGE | INTRAVENOUS | Status: DC | PRN
  Administered 2018-04-25: 140 mg via INTRAVENOUS

## 2018-04-25 MED ORDER — PROPOFOL 10 MG/ML IV BOLUS
INTRAVENOUS | Status: DC | PRN
  Administered 2018-04-25: 200 mg via INTRAVENOUS

## 2018-04-25 MED ORDER — SODIUM CHLORIDE (PF) 0.9 % IJ SOLN
INTRAMUSCULAR | Status: AC
  Filled 2018-04-25: qty 50

## 2018-04-25 MED ORDER — KCL IN DEXTROSE-NACL 20-5-0.45 MEQ/L-%-% IV SOLN
INTRAVENOUS | Status: DC
  Administered 2018-04-25 – 2018-04-26 (×3): via INTRAVENOUS
  Filled 2018-04-25 (×3): qty 1000

## 2018-04-25 MED ORDER — SCOPOLAMINE 1 MG/3DAYS TD PT72
1.0000 | MEDICATED_PATCH | TRANSDERMAL | Status: DC
  Administered 2018-04-25: 1.5 mg via TRANSDERMAL
  Filled 2018-04-25: qty 1

## 2018-04-25 MED ORDER — ACETAMINOPHEN 160 MG/5ML PO SOLN
650.0000 mg | Freq: Four times a day (QID) | ORAL | Status: DC
  Administered 2018-04-25 – 2018-04-26 (×3): 650 mg via ORAL
  Filled 2018-04-25 (×3): qty 20.3

## 2018-04-25 MED ORDER — BUPIVACAINE LIPOSOME 1.3 % IJ SUSP
INTRAMUSCULAR | Status: DC | PRN
  Administered 2018-04-25: 20 mL

## 2018-04-25 MED ORDER — FENTANYL CITRATE (PF) 250 MCG/5ML IJ SOLN
INTRAMUSCULAR | Status: DC | PRN
  Administered 2018-04-25: 100 ug via INTRAVENOUS

## 2018-04-25 MED ORDER — HYDROMORPHONE HCL 1 MG/ML IJ SOLN
0.2500 mg | INTRAMUSCULAR | Status: DC | PRN
  Administered 2018-04-25: 0.5 mg via INTRAVENOUS

## 2018-04-25 MED ORDER — KETAMINE HCL 10 MG/ML IJ SOLN
INTRAMUSCULAR | Status: DC | PRN
  Administered 2018-04-25: 25 mg via INTRAVENOUS

## 2018-04-25 MED ORDER — DIPHENHYDRAMINE HCL 50 MG/ML IJ SOLN: 12.5000 mg | Freq: Three times a day (TID) | INTRAMUSCULAR | Status: DC | PRN

## 2018-04-25 MED ORDER — KETAMINE HCL 10 MG/ML IJ SOLN
INTRAMUSCULAR | Status: AC
  Filled 2018-04-25: qty 1

## 2018-04-25 MED ORDER — LACTATED RINGERS IV SOLN
INTRAVENOUS | Status: DC
  Administered 2018-04-25: 09:00:00 via INTRAVENOUS

## 2018-04-25 MED ORDER — PROMETHAZINE HCL 25 MG/ML IJ SOLN
6.2500 mg | INTRAMUSCULAR | Status: DC | PRN
  Administered 2018-04-25: 12.5 mg via INTRAVENOUS

## 2018-04-25 MED ORDER — CHLORHEXIDINE GLUCONATE 4 % EX LIQD: 60.0000 mL | Freq: Once | CUTANEOUS | Status: DC

## 2018-04-25 MED ORDER — LACTATED RINGERS IR SOLN
Status: DC | PRN
  Administered 2018-04-25: 1000 mL

## 2018-04-25 MED ORDER — MIDAZOLAM HCL 2 MG/2ML IJ SOLN
INTRAMUSCULAR | Status: DC | PRN
  Administered 2018-04-25: 2 mg via INTRAVENOUS

## 2018-04-25 MED ORDER — SODIUM CHLORIDE 0.9 % IV SOLN
2.0000 g | INTRAVENOUS | Status: AC
  Administered 2018-04-25: 2 g via INTRAVENOUS
  Filled 2018-04-25: qty 2

## 2018-04-25 MED ORDER — PANTOPRAZOLE SODIUM 40 MG IV SOLR
40.0000 mg | Freq: Every day | INTRAVENOUS | Status: DC
  Administered 2018-04-25: 40 mg via INTRAVENOUS
  Filled 2018-04-25: qty 40

## 2018-04-25 MED ORDER — LIDOCAINE 2% (20 MG/ML) 5 ML SYRINGE
INTRAMUSCULAR | Status: DC | PRN
  Administered 2018-04-25: 100 mg via INTRAVENOUS

## 2018-04-25 MED ORDER — GABAPENTIN 100 MG PO CAPS
200.0000 mg | ORAL_CAPSULE | Freq: Two times a day (BID) | ORAL | Status: DC
  Administered 2018-04-26: 200 mg via ORAL
  Filled 2018-04-25 (×2): qty 2

## 2018-04-25 MED ORDER — ROCURONIUM BROMIDE 10 MG/ML (PF) SYRINGE
PREFILLED_SYRINGE | INTRAVENOUS | Status: DC | PRN
  Administered 2018-04-25: 30 mg via INTRAVENOUS
  Administered 2018-04-25: 20 mg via INTRAVENOUS

## 2018-04-25 MED ORDER — PHENYLEPHRINE 40 MCG/ML (10ML) SYRINGE FOR IV PUSH (FOR BLOOD PRESSURE SUPPORT)
PREFILLED_SYRINGE | INTRAVENOUS | Status: AC
  Filled 2018-04-25: qty 30

## 2018-04-25 MED ORDER — 0.9 % SODIUM CHLORIDE (POUR BTL) OPTIME
TOPICAL | Status: DC | PRN
  Administered 2018-04-25: 1000 mL

## 2018-04-25 MED ORDER — HYDROMORPHONE HCL 1 MG/ML IJ SOLN
INTRAMUSCULAR | Status: AC
  Filled 2018-04-25: qty 1

## 2018-04-25 MED ORDER — APREPITANT 40 MG PO CAPS
40.0000 mg | ORAL_CAPSULE | ORAL | Status: AC
  Administered 2018-04-25: 40 mg via ORAL
  Filled 2018-04-25: qty 1

## 2018-04-25 MED ORDER — PROMETHAZINE HCL 25 MG/ML IJ SOLN: 12.5000 mg | Freq: Four times a day (QID) | INTRAMUSCULAR | Status: DC | PRN

## 2018-04-25 MED ORDER — PROPOFOL 10 MG/ML IV BOLUS
INTRAVENOUS | Status: AC
  Filled 2018-04-25: qty 20

## 2018-04-25 MED ORDER — SODIUM CHLORIDE (PF) 0.9 % IJ SOLN
INTRAMUSCULAR | Status: DC | PRN
  Administered 2018-04-25: 50 mL

## 2018-04-25 MED ORDER — ROCURONIUM BROMIDE 10 MG/ML (PF) SYRINGE
PREFILLED_SYRINGE | INTRAVENOUS | Status: AC
  Filled 2018-04-25: qty 10

## 2018-04-25 MED ORDER — LIDOCAINE 2% (20 MG/ML) 5 ML SYRINGE
INTRAMUSCULAR | Status: AC
  Filled 2018-04-25: qty 5

## 2018-04-25 MED ORDER — GABAPENTIN 300 MG PO CAPS
300.0000 mg | ORAL_CAPSULE | ORAL | Status: AC
  Administered 2018-04-25: 300 mg via ORAL
  Filled 2018-04-25: qty 1

## 2018-04-25 MED ORDER — HEPARIN SODIUM (PORCINE) 5000 UNIT/ML IJ SOLN
5000.0000 [IU] | INTRAMUSCULAR | Status: AC
  Administered 2018-04-25: 5000 [IU] via SUBCUTANEOUS
  Filled 2018-04-25: qty 1

## 2018-04-25 MED ORDER — OXYCODONE HCL 5 MG/5ML PO SOLN
5.0000 mg | ORAL | Status: DC | PRN
  Administered 2018-04-26: 5 mg via ORAL
  Filled 2018-04-25: qty 5

## 2018-04-25 MED ORDER — DEXAMETHASONE SODIUM PHOSPHATE 4 MG/ML IJ SOLN: 4.0000 mg | INTRAMUSCULAR | Status: DC

## 2018-04-25 MED ORDER — MORPHINE SULFATE (PF) 2 MG/ML IV SOLN
1.0000 mg | INTRAVENOUS | Status: DC | PRN
  Administered 2018-04-25: 2 mg via INTRAVENOUS
  Filled 2018-04-25: qty 1

## 2018-04-25 MED ORDER — MIDAZOLAM HCL 2 MG/2ML IJ SOLN
INTRAMUSCULAR | Status: AC
  Filled 2018-04-25: qty 2

## 2018-04-25 MED ORDER — SUGAMMADEX SODIUM 200 MG/2ML IV SOLN
INTRAVENOUS | Status: AC
  Filled 2018-04-25: qty 2

## 2018-04-25 MED ORDER — ONDANSETRON HCL 4 MG/2ML IJ SOLN
INTRAMUSCULAR | Status: AC
  Filled 2018-04-25: qty 2

## 2018-04-25 MED ORDER — SIMETHICONE 80 MG PO CHEW: 80.0000 mg | CHEWABLE_TABLET | Freq: Four times a day (QID) | ORAL | Status: DC | PRN

## 2018-04-25 SURGICAL SUPPLY — 72 items
APPLICATOR COTTON TIP 6 STRL (MISCELLANEOUS) IMPLANT
APPLICATOR COTTON TIP 6IN STRL (MISCELLANEOUS)
APPLIER CLIP ROT 10 11.4 M/L (STAPLE)
APPLIER CLIP ROT 13.4 12 LRG (CLIP)
BANDAGE ADH SHEER 1  50/CT (GAUZE/BANDAGES/DRESSINGS) ×4 IMPLANT
BENZOIN TINCTURE PRP APPL 2/3 (GAUZE/BANDAGES/DRESSINGS) ×4 IMPLANT
BLADE SURG SZ11 CARB STEEL (BLADE) ×4 IMPLANT
CABLE HIGH FREQUENCY MONO STRZ (ELECTRODE) ×4 IMPLANT
CHLORAPREP W/TINT 26ML (MISCELLANEOUS) ×4 IMPLANT
CLIP APPLIE ROT 10 11.4 M/L (STAPLE) IMPLANT
CLIP APPLIE ROT 13.4 12 LRG (CLIP) IMPLANT
CLOSURE WOUND 1/2 X4 (GAUZE/BANDAGES/DRESSINGS) ×1
COVER SURGICAL LIGHT HANDLE (MISCELLANEOUS) ×4 IMPLANT
COVER WAND RF STERILE (DRAPES) ×4 IMPLANT
DECANTER SPIKE VIAL GLASS SM (MISCELLANEOUS) ×4 IMPLANT
DEVICE SUT QUICK LOAD TK 5 (STAPLE) ×3 IMPLANT
DEVICE SUT TI-KNOT TK 5X26 (MISCELLANEOUS) ×3 IMPLANT
DEVICE SUTURE ENDOST 10MM (ENDOMECHANICALS) ×4 IMPLANT
DEVICE TI KNOT TK5 (MISCELLANEOUS) ×1
DISSECTOR BLUNT TIP ENDO 5MM (MISCELLANEOUS) IMPLANT
DRAPE UTILITY XL STRL (DRAPES) ×8 IMPLANT
ELECT L-HOOK LAP 45CM DISP (ELECTROSURGICAL)
ELECT PENCIL ROCKER SW 15FT (MISCELLANEOUS) IMPLANT
ELECT REM PT RETURN 15FT ADLT (MISCELLANEOUS) ×4 IMPLANT
ELECTRODE L-HOOK LAP 45CM DISP (ELECTROSURGICAL) IMPLANT
GAUZE SPONGE 2X2 8PLY STRL LF (GAUZE/BANDAGES/DRESSINGS) IMPLANT
GAUZE SPONGE 4X4 12PLY STRL (GAUZE/BANDAGES/DRESSINGS) IMPLANT
GLOVE BIO SURGEON STRL SZ7.5 (GLOVE) ×4 IMPLANT
GLOVE INDICATOR 8.0 STRL GRN (GLOVE) ×4 IMPLANT
GOWN STRL REUS W/TWL XL LVL3 (GOWN DISPOSABLE) ×12 IMPLANT
GRASPER SUT TROCAR 14GX15 (MISCELLANEOUS) ×4 IMPLANT
HOVERMATT SINGLE USE (MISCELLANEOUS) ×4 IMPLANT
KIT BASIN OR (CUSTOM PROCEDURE TRAY) ×4 IMPLANT
KIT TURNOVER KIT A (KITS) IMPLANT
MARKER SKIN DUAL TIP RULER LAB (MISCELLANEOUS) ×4 IMPLANT
NEEDLE SPNL 22GX3.5 QUINCKE BK (NEEDLE) ×4 IMPLANT
PACK UNIVERSAL I (CUSTOM PROCEDURE TRAY) ×4 IMPLANT
QUICK LOAD TK 5 (STAPLE) ×1
RELOAD STAPLER 60MM BLK (STAPLE) IMPLANT
RELOAD STAPLER BLUE 60MM (STAPLE) ×8 IMPLANT
RELOAD STAPLER GOLD 60MM (STAPLE) IMPLANT
RELOAD STAPLER GREEN 60MM (STAPLE) ×4 IMPLANT
SCISSORS LAP 5X45 EPIX DISP (ENDOMECHANICALS) IMPLANT
SEALANT SURGICAL APPL DUAL CAN (MISCELLANEOUS) IMPLANT
SET IRRIG TUBING LAPAROSCOPIC (IRRIGATION / IRRIGATOR) ×4 IMPLANT
SET TUBE SMOKE EVAC HIGH FLOW (TUBING) ×4 IMPLANT
SHEARS HARMONIC ACE PLUS 45CM (MISCELLANEOUS) ×4 IMPLANT
SLEEVE GASTRECTOMY 40FR VISIGI (MISCELLANEOUS) ×4 IMPLANT
SLEEVE XCEL OPT CAN 5 100 (ENDOMECHANICALS) ×12 IMPLANT
SOLUTION ANTI FOG 6CC (MISCELLANEOUS) ×4 IMPLANT
SPONGE GAUZE 2X2 STER 10/PKG (GAUZE/BANDAGES/DRESSINGS)
SPONGE LAP 18X18 RF (DISPOSABLE) ×4 IMPLANT
STAPLER ECHELON BIOABSB 60 FLE (MISCELLANEOUS) ×24 IMPLANT
STAPLER ECHELON LONG 60 440 (INSTRUMENTS) ×4 IMPLANT
STAPLER RELOAD 60MM BLK (STAPLE)
STAPLER RELOAD BLUE 60MM (STAPLE) ×16
STAPLER RELOAD GOLD 60MM (STAPLE)
STAPLER RELOAD GREEN 60MM (STAPLE) ×8
STRIP CLOSURE SKIN 1/2X4 (GAUZE/BANDAGES/DRESSINGS) ×3 IMPLANT
SUT MNCRL AB 4-0 PS2 18 (SUTURE) ×4 IMPLANT
SUT SURGIDAC NAB ES-9 0 48 120 (SUTURE) ×4 IMPLANT
SUT VICRYL 0 TIES 12 18 (SUTURE) ×4 IMPLANT
SYR 20CC LL (SYRINGE) ×4 IMPLANT
SYR 50ML LL SCALE MARK (SYRINGE) ×4 IMPLANT
TOWEL OR 17X26 10 PK STRL BLUE (TOWEL DISPOSABLE) ×4 IMPLANT
TOWEL OR NON WOVEN STRL DISP B (DISPOSABLE) ×4 IMPLANT
TROCAR BLADELESS 15MM (ENDOMECHANICALS) ×4 IMPLANT
TROCAR BLADELESS OPT 5 100 (ENDOMECHANICALS) ×4 IMPLANT
TUBE CALIBRATION LAPBAND (TUBING) ×4 IMPLANT
TUBING CONNECTING 10 (TUBING) ×6 IMPLANT
TUBING CONNECTING 10' (TUBING) ×2
TUBING ENDO SMARTCAP (MISCELLANEOUS) ×4 IMPLANT

## 2018-04-25 NOTE — Interval H&P Note (Signed)
History and Physical Interval Note:  04/25/2018 10:51 AM  Regina Chambers  has presented today for surgery, with the diagnosis of morbid obesity, PCOS, GERD,.  The various methods of treatment have been discussed with the patient and family. After consideration of risks, benefits and other options for treatment, the patient has consented to  Procedure(s): LAPAROSCOPIC GASTRIC SLEEVE RESECTION, UPPER ENDO, ERAS, POSSIBLE HIATAL HERNIA REPAIR (N/A) as a surgical intervention.  The patient's history has been reviewed, patient examined, no change in status, stable for surgery.  I have reviewed the patient's chart and labs.  Questions were answered to the patient's satisfaction.    Mary Sella. [name], MD, FACS General, Bariatric, & Minimally Invasive Surgery [facility] [place]    Gaynelle Adu

## 2018-04-25 NOTE — Anesthesia Postprocedure Evaluation (Signed)
Anesthesia Post Note  Patient: Regina Chambers  Procedure(s) Performed: LAPAROSCOPIC GASTRIC SLEEVE RESECTION, UPPER ENDO, ERAS,  HIATAL HERNIA REPAIR (N/A Abdomen) UPPER GI ENDOSCOPY     Patient location during evaluation: PACU Anesthesia Type: General Level of consciousness: awake and alert Pain management: pain level controlled Vital Signs Assessment: post-procedure vital signs reviewed and stable Respiratory status: spontaneous breathing, nonlabored ventilation and respiratory function stable Cardiovascular status: blood pressure returned to baseline and stable Postop Assessment: no apparent nausea or vomiting Anesthetic complications: no    Last Vitals:  Vitals:   04/25/18 1400 04/25/18 1436  BP: (!) 141/93 (!) 147/93  Pulse: 78 71  Resp: 19 14  Temp:  36.8 C  SpO2: 99% 100%    Last Pain:  Vitals:   04/25/18 1436  TempSrc:   PainSc: Asleep                 Kaylyn Layer

## 2018-04-25 NOTE — Anesthesia Procedure Notes (Signed)
Procedure Name: Intubation Date/Time: 04/25/2018 11:19 AM Performed by: Eben Burow, CRNA Pre-anesthesia Checklist: Patient identified, Emergency Drugs available, Suction available, Patient being monitored and Timeout performed Patient Re-evaluated:Patient Re-evaluated prior to induction Oxygen Delivery Method: Circle system utilized Preoxygenation: Pre-oxygenation with 100% oxygen Induction Type: IV induction Ventilation: Mask ventilation without difficulty Laryngoscope Size: Mac and 4 Grade View: Grade I Tube type: Oral Number of attempts: 1 Airway Equipment and Method: Stylet Placement Confirmation: ETT inserted through vocal cords under direct vision,  positive ETCO2 and breath sounds checked- equal and bilateral Secured at: 21 cm Tube secured with: Tape Dental Injury: Teeth and Oropharynx as per pre-operative assessment

## 2018-04-25 NOTE — Transfer of Care (Signed)
Immediate Anesthesia Transfer of Care Note  Patient: Regina Chambers  Procedure(s) Performed: LAPAROSCOPIC GASTRIC SLEEVE RESECTION, UPPER ENDO, ERAS,  HIATAL HERNIA REPAIR (N/A Abdomen) UPPER GI ENDOSCOPY  Patient Location: PACU  Anesthesia Type:General  Level of Consciousness: awake, alert , oriented and patient cooperative  Airway & Oxygen Therapy: Patient Spontanous Breathing and Patient connected to face mask oxygen  Post-op Assessment: Report given to RN and Post -op Vital signs reviewed and stable  Post vital signs: Reviewed and stable  Last Vitals:  Vitals Value Taken Time  BP    Temp    Pulse 86 04/25/2018  1:12 PM  Resp 15 04/25/2018  1:12 PM  SpO2 100 % 04/25/2018  1:12 PM  Vitals shown include unvalidated device data.  Last Pain:  Vitals:   04/25/18 0918  TempSrc:   PainSc: 0-No pain      Patients Stated Pain Goal: 3 (04/25/18 8657)  Complications: No apparent anesthesia complications

## 2018-04-25 NOTE — Op Note (Signed)
Regina Chambers [id number] Mar 28, 1990 04/25/2018  Preoperative diagnosis: sleeve gastrectomy with hiatal hernia repair in progress  Postoperative diagnosis: Same   Procedure: Upper endoscopy   Surgeon: Susy Frizzle B. Daphine Chambers  M.D., FACS   Anesthesia: Gen.   Indications for procedure: This patient was undergoing a sleeve gastrectomy and we wanted to check for leaks and bleeds.    Description of procedure: The endoscopy was placed in the mouth and into the oropharynx and under endoscopic vision it was advanced to the esophagogastric junction.  The pouch was insufflated and the cylinder was symmetric down to the antrum  Pylorus identified..   No bleeding or leaks were detected.  The scope was withdrawn without difficulty.     Regina B. Daphine Deutscher, MD, FACS General, Bariatric, & Minimally Invasive Surgery [facility], [place]

## 2018-04-25 NOTE — Discharge Instructions (Signed)
° ° ° °GASTRIC BYPASS/SLEEVE ° Home Care Instructions ° ° These instructions are to help you care for yourself when you go home. ° °Call: If you have any problems. °• Call [phone] and ask for the surgeon on call °• If you need immediate help, come to the ER at [place].  °• Tell the ER staff that you are a new post-op gastric bypass or gastric sleeve patient °  °Signs and symptoms to report: • Severe vomiting or nausea °o If you cannot keep down clear liquids for longer than 1 day, call your surgeon  °• Abdominal pain that does not get better after taking your pain medication °• Fever over 100.4° F with chills °• Heart beating over 100 beats a minute °• Shortness of breath at rest °• Chest pain °•  Redness, swelling, drainage, or foul odor at incision (surgical) sites °•  If your incisions open or pull apart °• Swelling or pain in calf (lower leg) °• Diarrhea (Loose bowel movements that happen often), frequent watery, uncontrolled bowel movements °• Constipation, (no bowel movements for 3 days) if this happens: Pick one °o Milk of Magnesia, 2 tablespoons by mouth, 3 times a day for 2 days if needed °o Stop taking Milk of Magnesia once you have a bowel movement °o Call your doctor if constipation continues °Or °o Miralax  (instead of Milk of Magnesia) following the label instructions °o Stop taking Miralax once you have a bowel movement °o Call your doctor if constipation continues °• Anything you think is not normal °  °Normal side effects after surgery: • Unable to sleep at night or unable to focus °• Irritability or moody °• Being tearful (crying) or depressed °These are common complaints, possibly related to your anesthesia medications that put you to sleep, stress of surgery, and change in lifestyle.  This usually goes away a few weeks after surgery.  If these feelings continue, call your primary care doctor. °  °Wound Care: You may have surgical glue, steri-strips, or staples over your incisions after  surgery °• Surgical glue:  Looks like a clear film over your incisions and will wear off a little at a time °• Steri-strips: Strips of tape over your incisions. You may notice a yellowish color on the skin under the steri-strips. This is used to make the   steri-strips stick better. Do not pull the steri-strips off - let them fall off °• Staples: Staples may be removed before you leave the hospital °o If you go home with staples, call [facility], [phone] at for an appointment with your surgeon’s nurse to have staples removed 10 days after surgery. °• Showering: You may shower two (2) days after your surgery unless your surgeon tells you differently °o Wash gently around incisions with warm soapy water, rinse well, and gently pat dry  °o No tub baths until staples are removed, steri-strips fall off or glue is gone.  °  °Medications: • Medications should be liquid or crushed if larger than the size of a dime °• Extended release pills (medication that release a little bit at a time through the day) should NOT be crushed or cut. (examples include XL, ER, DR, SR) °• Depending on the size and number of medications you take, you may need to space (take a few throughout the day)/change the time you take your medications so that you do not over-fill your pouch (smaller stomach) °• Make sure you follow-up with your primary care doctor to   make medication changes needed during rapid weight loss and life-style changes °• If you have diabetes, follow up with the doctor that orders your diabetes medication(s) within one week after surgery and check your blood sugar regularly. °• Do not drive while taking prescription pain medication  °• It is ok to take Tylenol by the bottle instructions with your pain medicine or instead of your pain medicine as needed.  DO NOT TAKE NSAIDS (EXAMPLES OF NSAIDS:  IBUPROFREN/ NAPROXEN)  °Diet:                    First 2 Weeks ° You will see the dietician t about two (2) weeks  after your surgery. The dietician will increase the types of foods you can eat if you are handling liquids well: °• If you have severe vomiting or nausea and cannot keep down clear liquids lasting longer than 1 day, call your surgeon @ ([phone]) °Protein Shake °• Drink at least 2 ounces of shake 5-6 times per day °• Each serving of protein shakes (usually 8 - 12 ounces) should have: °o 15 grams of protein  °o And no more than 5 grams of carbohydrate  °• Goal for protein each day: °o Men = 80 grams per day °o Women = 60 grams per day °• Protein powder may be added to fluids such as non-fat milk or Lactaid milk or unsweetened Soy/Almond milk (limit to 35 grams added protein powder per serving) ° °Hydration °• Slowly increase the amount of water and other clear liquids as tolerated (See Acceptable Fluids) °• Slowly increase the amount of protein shake as tolerated  °•  Sip fluids slowly and throughout the day.  Do not use straws. °• May use sugar substitutes in small amounts (no more than 6 - 8 packets per day; i.e. Splenda) ° °Fluid Goal °• The first goal is to drink at least 8 ounces of protein shake/drink per day (or as directed by the nutritionist); some examples of protein shakes are Syntrax Nectar, Adkins Advantage, EAS Edge HP, and Unjury. See handout from pre-op Bariatric Education Class: °o Slowly increase the amount of protein shake you drink as tolerated °o You may find it easier to slowly sip shakes throughout the day °o It is important to get your proteins in first °• Your fluid goal is to drink 64 - 100 ounces of fluid daily °o It may take a few weeks to build up to this °• 32 oz (or more) should be clear liquids  °And  °• 32 oz (or more) should be full liquids (see below for examples) °• Liquids should not contain sugar, caffeine, or carbonation ° °Clear Liquids: °• Water or Sugar-free flavored water (i.e. Fruit H2O, Propel) °• Decaffeinated coffee or tea (sugar-free) °• Crystal Lite, Wyler’s Lite,  Minute Maid Lite °• Sugar-free Jell-O °• Bouillon or broth °• Sugar-free Popsicle:   *Less than 20 calories each; Limit 1 per day ° °Full Liquids: °Protein Shakes/Drinks + 2 choices per day of other full liquids °• Full liquids must be: °o No More Than 15 grams of Carbs per serving  °o No More Than 3 grams of Fat per serving °• Strained low-fat cream soup (except Cream of Potato or Tomato) °• Non-Fat milk °• Fat-free Lactaid Milk °• Unsweetened Soy Or Unsweetened Almond Milk °• Low Sugar yogurt (Dannon Lite & Fit, Greek yogurt; Oikos Triple Zero; Chobani Simply 100; Yoplait 100 calorie Greek - No Fruit on the Bottom) ° °  °Vitamins   and Minerals • Start 1 day after surgery unless otherwise directed by your surgeon °• 2 Chewable Bariatric Specific Multivitamin / Multimineral Supplement with iron (Example: Bariatric Advantage Multi EA) °• Chewable Calcium with Vitamin D-3 °(Example: 3 Chewable Calcium Plus 600 with Vitamin D-3) °o Take 500 mg three (3) times a day for a total of 1500 mg each day °o Do not take all 3 doses of calcium at one time as it may cause constipation, and you can only absorb 500 mg  at a time  °o Do not mix multivitamins containing iron with calcium supplements; take 2 hours apart °• Menstruating women and those with a history of anemia (a blood disease that causes weakness) may need extra iron °o Talk with your doctor to see if you need more iron °• Do not stop taking or change any vitamins or minerals until you talk to your dietitian or surgeon °• Your Dietitian and/or surgeon must approve all vitamin and mineral supplements °  °Activity and Exercise: Limit your physical activity as instructed by your doctor.  It is important to continue walking at home.  During this time, use these guidelines: °• Do not lift anything greater than ten (10) pounds for at least two (2) weeks °• Do not go back to work or drive until your surgeon says you can °• You may have sex when you feel comfortable  °o It is  VERY important for female patients to use a reliable birth control method; fertility often increases after surgery  °o All hormonal birth control will be ineffective for 30 days after surgery due to medications given during surgery a barrier method must be used. °o Do not get pregnant for at least 18 months °• Start exercising as soon as your doctor tells you that you can °o Make sure your doctor approves any physical activity °• Start with a simple walking program °• Walk 5-15 minutes each day, 7 days per week.  °• Slowly increase until you are walking 30-45 minutes per day °Consider joining our BELT program. [phone] or email [email or web address] °  °Special Instructions Things to remember: °• Use your CPAP when sleeping if this applies to you ° °• [facility] has two free Bariatric Surgery Support Groups that meet monthly °o The 3rd Thursday of each month, 6 pm, [facility]  °o The 2nd Friday of each month, 11:45 am in the private dining room in the basement of [place] °• It is very important to keep all follow up appointments with your surgeon, dietitian, primary care physician, and behavioral health practitioner °• Routine follow up schedule with your surgeon include appointments at 2-3 weeks, 6-8 weeks, 6 months, and 1 year at a minimum.  Your surgeon may request to see you more often.   °o After the first year, please follow up with your bariatric surgeon and dietitian at least once a year in order to maintain best weight loss results °[facility]: [phone] °[facility]: [phone] °Bariatric Nurse Coordinator: [phone] °  °   Reviewed and Endorsed  °by [facility] Patient Education Committee, June, 2016 °Edits Approved: Aug, 2018 ° ° ° °

## 2018-04-25 NOTE — Progress Notes (Signed)
PHARMACY CONSULT FOR:  Risk Assessment for Post-Discharge VTE Following Bariatric Surgery  Post-Discharge VTE Risk Assessment: This patient's probability of 30-day post-discharge VTE is increased due to the factors marked:   Female    Age >/=60 years    BMI >/=50 kg/m2    CHF    Dyspnea at Rest    Paraplegia   x Non-gastric-band surgery    Operation Time >/=3 hr    Return to OR     Length of Stay >/= 3 d   Predicted probability of 30-day post-discharge VTE: 0.16%  Other patient-specific factors to consider:   Recommendation for Discharge: No pharmacologic prophylaxis post-discharge   Regina Chambers is a 28 y.o. female who underwent  Laparoscopic sleeve gastrectomy on 04/25/2018   Case start: 1144 Case end: 1305   No Known Allergies  Patient Measurements: Height: 5\' 2"  (157.5 cm) Weight: 227 lb (103 kg) IBW/kg (Calculated) : 50.1 Body mass index is 41.52 kg/m.  No results for input(s): WBC, HGB, HCT, PLT, APTT, CREATININE, LABCREA, CREATININE, CREAT24HRUR, MG, PHOS, ALBUMIN, PROT, ALBUMIN, AST, ALT, ALKPHOS, BILITOT, BILIDIR, IBILI in the last 72 hours. Estimated Creatinine Clearance: 116.8 mL/min (by C-G formula based on SCr of 0.71 mg/dL).    Past Medical History:  Diagnosis Date  . Anxiety   . Blood type, Rh positive   . Cervical insufficiency in pregnancy, antepartum    prior pregnancy  . GERD (gastroesophageal reflux disease)   . Obesity   . Polycystic disease, ovaries      Medications Prior to Admission  Medication Sig Dispense Refill Last Dose  . levonorgestrel (MIRENA) 20 MCG/24HR IUD 1 each by Intrauterine route once. Inserted 10/2014   Past Month at Unknown time  . phentermine 37.5 MG capsule Take 1 capsule (37.5 mg total) by mouth every morning. (Patient not taking: Reported on 04/18/2018) 30 capsule 0 Not Taking at Unknown time       Arley Phenix RPh 04/25/2018, 1:45 PM Pager [phone]

## 2018-04-25 NOTE — Op Note (Signed)
04/25/2018 Regina Chambers [birth year] [id number]   PRE-OPERATIVE DIAGNOSIS:   Severe obesity Hiatal hernia Elevated LDL GERD PCOS Prediabetes Chronic low back pain  POST-OPERATIVE DIAGNOSIS:  same  PROCEDURE:  Procedure(s): LAPAROSCOPIC SLEEVE GASTRECTOMY WITH HIATAL HERNIA REPAIR UPPER GI ENDOSCOPY  SURGEON:  Surgeon(s): Atilano Ina, MD FACS FASMBS  ASSISTANTS: Luretha Murphy MD FACS  ANESTHESIA:   general  DRAINS: none   BOUGIE: 40 fr ViSiGi  LOCAL MEDICATIONS USED:   Exparel  EBL: minimal  SPECIMEN:  Source of Specimen:  Greater curvature of stomach  DISPOSITION OF SPECIMEN:  PATHOLOGY  COUNTS:  YES  INDICATION FOR PROCEDURE: This is a very pleasant 28 y.o.-year-old morbidly obese female who has had unsuccessful attempts for sustained weight loss. The patient presents today for a planned laparoscopic sleeve gastrectomy with upper endoscopy. We have discussed the risk and benefits of the procedure extensively preoperatively. Please see my separate notes.  PROCEDURE: After obtaining informed consent and receiving 5000 units of subcutaneous heparin, the patient was brought to the operating room at [facility] and placed supine on the operating room table. General endotracheal anesthesia was established. Sequential compression devices were placed. A orogastric tube was placed. The patient's abdomen was prepped and draped in the usual standard surgical fashion. The patient received preoperative IV antibiotic. A surgical timeout was performed. ERAS protocol used.   Access to the abdomen was achieved using a 5 mm 0 laparoscope thru a 5 mm trocar In the left upper Quadrant 2 fingerbreadths below the left subcostal margin using the Optiview technique. Pneumoperitoneum was smoothly established up to 15 mm of mercury. The laparoscope was advanced and the abdominal cavity was surveilled. The patient was then placed in reverse Trendelenburg.   A 5 mm trocar was placed  slightly above and to the left of the umbilicus under direct visualization.  The [facility] liver retractor was placed under the left lobe of the liver through a 5 mm trocar incision site in the subxiphoid position. A 5 mm trocar was placed in the lateral right upper quadrant along with a 15 mm trocar in the mid right abdomen. A final 5 mm trocar was placed in the lateral LUQ.  All under direct visualization after exparel had been infiltrated in bilateral lateral upper abdominal walls as a TAP block.  The stomach was inspected. It was completely decompressed and the orogastric tube was removed.  There is a small anterior dimple that was obviously visible. The calibration tube was placed in the oropharynx and guided down into the stomach by the CRNA. 10 mL of air was insufflated into the calibration balloon. The calibration tubing was then gently pulled back by the CRNA and it slid past the GE junction. At this point the calibration tubing was desufflated and pulled back into the esophagus. This confirmed my suspicion of a clinically significant hiatal hernia. The gastrohepatic ligament was incised with harmonic scalpel. The right crus was identified. We identified the crossing fat along the right crus. The adipose tissue just above this area was incised with harmonic scalpel. I then bluntly dissected out this area and identified the left crus. There was evidence of a hiatal hernia. I then mobilized the esophagus. The left and right crus were further mobilized with blunt dissection. I was then able to reapproximate the left and right crus with 0 Ethibond using an Endostitch suture device and securing it with a titanium tyknot. We then had the CRNA readvanced the calibration tubing back into the stomach. 10  mL of air was insufflated into the calibration tube balloon. The calibration tube was then gently pulled back and there was resistance at the GE junction. The tube did not slide back up into the esophagus. At  this point the calibration tubing was deflated and removed from the patient's body.   We identified the pylorus and measured 6 cm proximal to the pylorus and identified an area of where we would start taking down the short gastric vessels. Harmonic scalpel was used to take down the short gastric vessels along the greater curvature of the stomach. We were able to enter the lesser sac. We continued to march along the greater curvature of the stomach taking down the short gastrics. As we approached the gastrosplenic ligament we took care in this area not to injure the spleen. We were able to take down the entire gastrosplenic ligament. We then mobilized the fundus away from the left crus of diaphragm. There were not any significant posterior gastric avascular attachments. This left the stomach completely mobilized. No vessels had been taken down along the lesser curvature of the stomach.  We then reidentified the pylorus. A 40Fr ViSiGi was then placed in the oropharynx and advanced down into the stomach and placed in the distal antrum and positioned along the lesser curvature. It was placed under suction which secured the 40Fr ViSiGi in place along the lesser curve. Then using the Ethicon echelon 60 mm stapler with a green load with Seamguard, I placed a stapler along the antrum approximately 5 cm from the pylorus. The stapler was angled so that there is ample room at the angularis incisura. I then fired the first staple load after inspecting it posteriorly to ensure adequate space both anteriorly and posteriorly. At this point I still was not completely past the angularis so with another green load with Seamguard, I placed the stapler in position just inside the prior stapleline. We then rotated the stomach to insure that there was adequate anteriorly as well as posteriorly. The stapler was then fired.  At this point I started using 60 mm blue load staple cartridges with Seamguard. The echelon stapler was then  repositioned with a 60 mm blue load with Seamguard and we continued to march up along the ViSiGi. My assistant was holding traction along the greater curvature stomach along the cauterized short gastric vessels ensuring that the stomach was symmetrically retracted. Prior to each firing of the staple, we rotated the stomach to ensure that there is adequate stomach left.  As we approached the fundus, I used 60 mm blue cartridge with Seamguard aiming  lateral to the GE junction after mobilizing some of the esophageal fat pad.  The sleeve was inspected. There is no evidence of cork screw. The staple line appeared hemostatic. The CRNA inflated the ViSiGi to the green zone and the upper abdomen was flooded with saline. There were no bubbles. The sleeve was decompressed and the ViSiGi removed. My assistant scrubbed out and performed an upper endoscopy. The sleeve easily distended with air and the scope was easily advanced to the pylorus. There is no evidence of internal bleeding or cork screwing. There was no narrowing at the angularis. There is no evidence of bubbles. Please see his operative note for further details. The gastric sleeve was decompressed and the endoscope was removed.  The greater curvature the stomach was grasped with a laparoscopic grasper and removed from the 15 mm trocar site.  The liver retractor was removed. I then closed the 15  mm trocar site with 1 interrupted 0 Vicryl sutures through the fascia using the endoclose. The closure was viewed laparoscopically and it was airtight. Remaining Exparel was then infiltrated in the preperitoneal spaces around the trocar sites. Pneumoperitoneum was released. All trocar sites were closed with a 4-0 Monocryl in a subcuticular fashion followed by the application of benzoin, steri-strips, and bandaids. The patient was extubated and taken to the recovery room in stable condition. All needle, instrument, and sponge counts were correct x2. There are no immediate  complications  (2) 60 mm green with Seamguard (3) 60 mm blue with 2 seamguard  PLAN OF CARE: Admit to inpatient   PATIENT DISPOSITION:  PACU - hemodynamically stable.   Delay start of Pharmacological VTE agent (>24hrs) due to surgical blood loss or risk of bleeding:  no  Mary Sella. [name], MD, FACS FASMBS General, Bariatric, & Minimally Invasive Surgery [facility], [place]

## 2018-04-25 NOTE — Progress Notes (Signed)
Patient sleeping arouses to name, given phenergan in PACU.  Walked briefly to restroom, VSS.  No family present.  Spoke with bedside RN about progression

## 2018-04-25 NOTE — Progress Notes (Signed)
Unable to start water as ordered, patient very lethargic, sleepy since coming to floor.

## 2018-04-26 ENCOUNTER — Encounter (HOSPITAL_COMMUNITY): Payer: Self-pay | Admitting: *Deleted

## 2018-04-26 LAB — COMPREHENSIVE METABOLIC PANEL
ALT: 24 U/L (ref 0–44)
AST: 24 U/L (ref 15–41)
Albumin: 3.9 g/dL (ref 3.5–5.0)
Alkaline Phosphatase: 55 U/L (ref 38–126)
Anion gap: 9 (ref 5–15)
BUN: 6 mg/dL (ref 6–20)
CO2: 25 mmol/L (ref 22–32)
Calcium: 8.8 mg/dL — ABNORMAL LOW (ref 8.9–10.3)
Chloride: 102 mmol/L (ref 98–111)
Creatinine, Ser: 0.7 mg/dL (ref 0.44–1.00)
GFR calc Af Amer: >60 mL/min (ref >60)
GFR calc non Af Amer: >60 mL/min (ref >60)
Glucose, Bld: 137 mg/dL — ABNORMAL HIGH (ref 70–99)
Potassium: 4.2 mmol/L (ref 3.5–5.1)
Sodium: 136 mmol/L (ref 135–145)
TOTAL PROTEIN: 7 g/dL (ref 6.5–8.1)
Total Bilirubin: 0.8 mg/dL (ref 0.3–1.2)

## 2018-04-26 LAB — CBC WITH DIFFERENTIAL/PLATELET
Abs Immature Granulocytes: 0.06 10*3/uL (ref 0.00–0.07)
BASOS ABS: 0 10*3/uL (ref 0.0–0.1)
Basophils Relative: 0 %
EOS PCT: 0 %
Eosinophils Absolute: 0 10*3/uL (ref 0.0–0.5)
HCT: 40.6 % (ref 36.0–46.0)
Hemoglobin: 13.4 g/dL (ref 12.0–15.0)
Immature Granulocytes: 0 %
Lymphocytes Relative: 8 %
Lymphs Abs: 1.2 10*3/uL (ref 0.7–4.0)
MCH: 28.7 pg (ref 26.0–34.0)
MCHC: 33 g/dL (ref 30.0–36.0)
MCV: 86.9 fL (ref 80.0–100.0)
Monocytes Absolute: 1 10*3/uL (ref 0.1–1.0)
Monocytes Relative: 6 %
NRBC: 0 % (ref 0.0–0.2)
Neutro Abs: 14 10*3/uL — ABNORMAL HIGH (ref 1.7–7.7)
Neutrophils Relative %: 86 %
Platelets: 339 10*3/uL (ref 150–400)
RBC: 4.67 MIL/uL (ref 3.87–5.11)
RDW: 12.3 % (ref 11.5–15.5)
WBC: 16.4 10*3/uL — ABNORMAL HIGH (ref 4.0–10.5)

## 2018-04-26 MED ORDER — PHENOL 1.4 % MT LIQD
1.0000 | OROMUCOSAL | Status: DC | PRN
  Filled 2018-04-26: qty 177

## 2018-04-26 MED ORDER — ONDANSETRON 4 MG PO TBDP: 4.0000 mg | ORAL_TABLET | Freq: Four times a day (QID) | ORAL | 0 refills | Status: DC | PRN

## 2018-04-26 MED ORDER — TRAMADOL HCL 50 MG PO TABS: 50.0000 mg | ORAL_TABLET | Freq: Four times a day (QID) | ORAL | 0 refills | Status: DC | PRN

## 2018-04-26 MED ORDER — LIDOCAINE VISCOUS HCL 2 % MT SOLN
15.0000 mL | Freq: Once | OROMUCOSAL | Status: AC
  Administered 2018-04-26: 15 mL via ORAL
  Filled 2018-04-26: qty 15

## 2018-04-26 MED ORDER — PANTOPRAZOLE SODIUM 40 MG PO TBEC: 40.0000 mg | DELAYED_RELEASE_TABLET | Freq: Every day | ORAL | 0 refills | Status: DC

## 2018-04-26 MED ORDER — ALUM & MAG HYDROXIDE-SIMETH 200-200-20 MG/5ML PO SUSP
30.0000 mL | Freq: Once | ORAL | Status: AC
  Administered 2018-04-26: 30 mL via ORAL
  Filled 2018-04-26: qty 30

## 2018-04-26 NOTE — Plan of Care (Signed)
Patient lying in bed in room; no complaints of pain at this time. States she has ambulated in the room to bathroom but not in hall. Has been using IS. Will continue to monitor.

## 2018-04-26 NOTE — Progress Notes (Signed)
Nutrition Brief Note  RD consulted for diet education s/p bariatric surgery. Pt sleeping in bed and requests RD return later for education.  Will attempt education at a later time per pt request.  Tilda Franco, MS, RD, LDN [facility] Inpatient Clinical Dietitian Pager: [phone] After Hours Pager: [phone]

## 2018-04-26 NOTE — Discharge Summary (Signed)
Physician Discharge Summary  Regina Chambers SWN:[id number] DOB: Jun 18, 1990 DOA: 04/25/2018  PCP: Jearld Fenton, NP  Admit date: 04/25/2018 Discharge date: 04/26/2018  Recommendations for Outpatient Follow-up:   Follow-up Information    Greer Pickerel, MD. Go on 05/24/2018.   Specialty:  General Surgery Why:  at 930 am.  Please arrive 15 minutes prior to scheduled appointment time. Contact information: [address] [place] [address] [phone]        Carlena Hurl, PA-C. Go on 07/20/2018.   Specialty:  General Surgery Why:  at 9 am.  Please arrive 15 minutes early for scheduled appointment time. Contact information: [place] [address] [phone]          Discharge Diagnoses:  Active Problems:   S/P laparoscopic sleeve gastrectomy Severe obesity Hiatal hernia Elevated LDL GERD PCOS Prediabetes Chronic low back pain  Surgical Procedure: Laparoscopic Sleeve Gastrectomy with hiatal hernia repair, upper endoscopy  Discharge Condition: Good Disposition: Home  Diet recommendation: Postoperative sleeve gastrectomy diet (liquids only)  Filed Weights   04/25/18 0856 04/25/18 0918  Weight: 103 kg 103 kg     Hospital Course:  The patient was admitted for a planned laparoscopic sleeve gastrectomy. Please see operative note. Also repaired small hiatal hernia. Preoperatively the patient was given 5000 units of subcutaneous heparin for DVT prophylaxis. Postoperative prophylactic Lovenox dosing was started on the evening of postoperative day 0. ERAS protocol was used. On the evening of postoperative day 0, the patient was started on water and ice chips. On postoperative day 1 the patient had no fever or tachycardia and was having some difficulty tolerating water in their diet. However her oral intake and it was gradually advanced throughout the day. The patient was ambulating without difficulty. Their vital signs are stable  without fever or tachycardia. Their hemoglobin had remained stable. The patient had received discharge instructions and counseling. They were deemed stable for discharge and had met discharge criteria  BP (!) 146/94 (BP Location: Left Arm)   Pulse 100   Temp 98.6 F (37 C) (Oral)   Resp 16   Ht 5' 2"  (1.575 m)   Wt 103 kg   SpO2 97%   BMI 41.52 kg/m   Gen: alert, NAD, non-toxic appearing Pupils: equal, no scleral icterus Pulm: Lungs clear to auscultation, symmetric chest rise CV: regular rate and rhythm Abd: soft, mild approp tender, nondistended. No cellulitis. No incisional hernia Ext: no edema, no calf tenderness Skin: no rash, no jaundice   Discharge Instructions  Discharge Instructions    Ambulate hourly while awake   Complete by:  As directed    Call MD for:  difficulty breathing, headache or visual disturbances   Complete by:  As directed    Call MD for:  persistant dizziness or light-headedness   Complete by:  As directed    Call MD for:  persistant nausea and vomiting   Complete by:  As directed    Call MD for:  redness, tenderness, or signs of infection (pain, swelling, redness, odor or green/yellow discharge around incision site)   Complete by:  As directed    Call MD for:  severe uncontrolled pain   Complete by:  As directed    Call MD for:  temperature >101 F   Complete by:  As directed    Diet bariatric full liquid   Complete by:  As directed    Discharge instructions   Complete by:  As directed    See bariatric discharge instructions   Incentive spirometry   Complete by:  As directed    Perform hourly while awake     Allergies as of 04/26/2018   No Known Allergies     Medication List    STOP taking these medications   phentermine 37.5 MG capsule     TAKE these medications   levonorgestrel 20 MCG/24HR IUD Commonly known as:  MIRENA 1 each by Intrauterine route once. Inserted 10/2014   ondansetron 4 MG disintegrating tablet Commonly known  as:  ZOFRAN-ODT Take 1 tablet (4 mg total) by mouth every 6 (six) hours as needed for nausea or vomiting.   pantoprazole 40 MG tablet Commonly known as:  PROTONIX Take 1 tablet (40 mg total) by mouth daily.   traMADol 50 MG tablet Commonly known as:  ULTRAM Take 1 tablet (50 mg total) by mouth every 6 (six) hours as needed (pain).      Follow-up Information    Greer Pickerel, MD. Go on 05/24/2018.   Specialty:  General Surgery Why:  at 930 am.  Please arrive 15 minutes prior to scheduled appointment time. Contact information: [address] [place] [address] [phone]        Carlena Hurl, PA-C. Go on 07/20/2018.   Specialty:  General Surgery Why:  at 9 am.  Please arrive 15 minutes early for scheduled appointment time. Contact information: [address] [place] [address] [phone]            The results of significant diagnostics from this hospitalization (including imaging, microbiology, ancillary and laboratory) are listed below for reference.    Significant Diagnostic Studies: No results found.  Labs: Basic Metabolic Panel: Recent Labs  Lab 04/26/18 0406  NA 136  K 4.2  CL 102  CO2 25  GLUCOSE 137*  BUN 6  CREATININE 0.70  CALCIUM 8.8*   Liver Function Tests: Recent Labs  Lab 04/26/18 0406  AST 24  ALT 24  ALKPHOS 55  BILITOT 0.8  PROT 7.0  ALBUMIN 3.9    CBC: Recent Labs  Lab 04/25/18 1338 04/26/18 0406  WBC  --  16.4*  NEUTROABS  --  14.0*  HGB 13.3 13.4  HCT 40.1 40.6  MCV  --  86.9  PLT  --  339    CBG: No results for input(s): GLUCAP in the last 168 hours.  Active Problems:   S/P laparoscopic sleeve gastrectomy   Time coordinating discharge: 15 min  Signed:  Gayland Curry, MD [facility], [place] [phone] 04/26/2018, 4:02 PM

## 2018-04-26 NOTE — Progress Notes (Signed)
Patient alert and oriented, pain is controlled. Patient is tolerating fluids, advanced to protein shake today, patient is tolerating well.  Reviewed Gastric sleeve discharge instructions with patient and patient is able to articulate understanding.  Provided information on BELT program, Support Group and [facility] outpatient pharmacy. All questions answered, will continue to monitor.   Total fluid intake 510 Per dehydration protocol call back one week post op

## 2018-04-26 NOTE — Progress Notes (Addendum)
Started on last cup of clear fluid, to progress to protein shake. Sitting upright after ambulating in hallway.  Continues to work with incentive spirometer.  Spouse at bedside.  No questions at this time.

## 2018-04-26 NOTE — Progress Notes (Signed)
Patient alert and oriented, Post op day 1.  Provided support and encouragement.  Encouraged pulmonary toilet, ambulation and small sips of liquids.  Completed 4 ounces of clear fluids, c/o sore throat and cramping when drinking.  All questions answered.  Will continue to monitor.

## 2018-04-26 NOTE — Progress Notes (Signed)
Patient completed water intake. Started protein drinks 1430.

## 2018-04-26 NOTE — Progress Notes (Signed)
Patient started protein and completed 4 ounces.  States goes down much easier than the water.  No nausea, pain control.  Would like to go home today.  Discussed with Dr.Wilson.

## 2018-04-28 ENCOUNTER — Telehealth (HOSPITAL_COMMUNITY): Payer: Self-pay

## 2018-05-01 ENCOUNTER — Telehealth (HOSPITAL_COMMUNITY): Payer: Self-pay

## 2018-05-01 NOTE — Telephone Encounter (Signed)
Two attempts to provided phone number to contact patient regarding the below questions following bariatric surgery.  Mailbox is full unable to provide message for callback.     1.  Tell me about your pain and pain management?  2.  Let's talk about fluid intake.  How much total fluid are you taking in?  3.  How much protein have you taken in the last 2 days?  4.  Have you had nausea?  Tell me about when have experienced nausea and what you did to help?  5.  Has the frequency or color changed with your urine?  6.  Tell me what your incisions look like?  7.  Have you been passing gas? BM?  8.  If a problem or question were to arise who would you call?  Do you know contact numbers for [facility], [facility], and [facility]?  9.  How has the walking going?  10.  How are your vitamins and calcium going?  How are you taking them?

## 2018-05-09 ENCOUNTER — Ambulatory Visit: Payer: Self-pay | Admitting: Skilled Nursing Facility1

## 2018-05-24 ENCOUNTER — Encounter: Payer: Self-pay | Admitting: General Surgery

## 2018-08-28 ENCOUNTER — Telehealth: Payer: Self-pay

## 2018-08-28 NOTE — Telephone Encounter (Signed)
Pt will need to go to Urgent care or PCP to get tested. She has not been seen since 04/2017. I was unable to reach patient by phone d/t voicemail being full. KJ CMA

## 2018-08-28 NOTE — Telephone Encounter (Signed)
Pt calling triage wants to know if you can send in a Diflucan pill, she has a yeast infection . Pt aware we have not seen her in a year , she still wanted me to ask AMS if he could call it in because of the Covid. She didn't want to come in . Please advise , shes aware AMS is out of the office this morning.

## 2018-08-30 NOTE — Telephone Encounter (Signed)
Patient is schedule for 09/01/18

## 2018-08-30 NOTE — Telephone Encounter (Signed)
Pt calling back; missed call.  Direct work # [phone]  or [phone]

## 2018-09-01 ENCOUNTER — Ambulatory Visit: Payer: Managed Care, Other (non HMO) | Admitting: Advanced Practice Midwife

## 2018-09-27 ENCOUNTER — Ambulatory Visit: Payer: Managed Care, Other (non HMO) | Admitting: Internal Medicine

## 2018-09-27 DIAGNOSIS — Z0289 Encounter for other administrative examinations: Secondary | ICD-10-CM

## 2018-11-16 ENCOUNTER — Ambulatory Visit (INDEPENDENT_AMBULATORY_CARE_PROVIDER_SITE_OTHER): Payer: Managed Care, Other (non HMO) | Admitting: Obstetrics & Gynecology

## 2018-11-16 ENCOUNTER — Other Ambulatory Visit: Payer: Self-pay

## 2018-11-16 ENCOUNTER — Encounter: Payer: Self-pay | Admitting: Obstetrics & Gynecology

## 2018-11-16 ENCOUNTER — Other Ambulatory Visit (HOSPITAL_COMMUNITY)
Admission: RE | Admit: 2018-11-16 | Discharge: 2018-11-16 | Disposition: A | Discharge status: Home / Self Care | Payer: Managed Care, Other (non HMO) | Source: Ambulatory Visit | Attending: Obstetrics & Gynecology | Admitting: Obstetrics & Gynecology

## 2018-11-16 VITALS — BP 110/70 | Ht 62.0 in | Wt 199.0 lb

## 2018-11-16 DIAGNOSIS — B379 Candidiasis, unspecified: Secondary | ICD-10-CM | POA: Diagnosis not present

## 2018-11-16 DIAGNOSIS — N760 Acute vaginitis: Secondary | ICD-10-CM | POA: Diagnosis present

## 2018-11-16 DIAGNOSIS — E282 Polycystic ovarian syndrome: Secondary | ICD-10-CM | POA: Diagnosis not present

## 2018-11-16 DIAGNOSIS — B9689 Other specified bacterial agents as the cause of diseases classified elsewhere: Secondary | ICD-10-CM | POA: Diagnosis present

## 2018-11-16 DIAGNOSIS — E669 Obesity, unspecified: Secondary | ICD-10-CM | POA: Diagnosis not present

## 2018-11-16 MED ORDER — METRONIDAZOLE 500 MG PO TABS: 500.0000 mg | ORAL_TABLET | Freq: Two times a day (BID) | ORAL | 0 refills | Status: DC

## 2018-11-16 MED ORDER — FLUCONAZOLE 150 MG PO TABS: 150.0000 mg | ORAL_TABLET | Freq: Once | ORAL | 3 refills | Status: AC

## 2018-11-16 MED ORDER — PHENTERMINE HCL 37.5 MG PO TABS: 37.5000 mg | ORAL_TABLET | Freq: Every day | ORAL | 0 refills | Status: DC

## 2018-11-16 NOTE — Progress Notes (Signed)
HPI:      Ms. Regina Chambers is a 28 y.o. G2P1011 who LMP was No LMP recorded. (Menstrual status: IUD)., presents today for a problem visit.  She complains of:  Vaginitis: Patient complains of an abnormal vaginal discharge for many months, usually occuring at about the same time (no cycles due to IUD, and PCOS) w itch and d/c. Vaginal symptoms include discharge described as white.Vulvar symptoms include none.STI Risk: Very low risk of STD exposureDischarge described as: white.Other associated symptoms: local irritation.Menstrual pattern: She had been bleeding none w IUD. Contraception: IUD   Also, weight gain 30lbs last 6 mos.  Prior problem w obesity.  Meds in past have helped.  PMHx: She  has a past medical history of Anxiety, Blood type, Rh positive, Cervical insufficiency in pregnancy, antepartum, GERD (gastroesophageal reflux disease), Obesity, and Polycystic disease, ovaries. Also,  has a past surgical history that includes Cervical cerclage (2016); Cesarean section (N/A, 09/02/2014); Cholecystectomy (N/A, 05/02/2015); Intrauterine device (iud) insertion (10/29/2014); Laparoscopic gastric sleeve resection (N/A, 04/25/2018); and Upper gi endoscopy (04/25/2018)., family history includes Heart disease in her father; Hypertension in her father; Kidney disease in her father.,  reports that she has never smoked. She has never used smokeless tobacco. She reports that she does not drink alcohol or use drugs.  She has a current medication list which includes the following prescription(s): levonorgestrel, fluconazole, metronidazole, ondansetron, pantoprazole, phentermine, and tramadol. Also, has No Known Allergies.  Review of Systems  Constitutional: Negative for chills, fever and malaise/fatigue.  HENT: Negative for congestion, sinus pain and sore throat.   Eyes: Negative for blurred vision and pain.  Respiratory: Negative for cough and wheezing.   Cardiovascular: Negative for chest pain and leg  swelling.  Gastrointestinal: Negative for abdominal pain, constipation, diarrhea, heartburn, nausea and vomiting.  Genitourinary: Negative for dysuria, frequency, hematuria and urgency.  Musculoskeletal: Negative for back pain, joint pain, myalgias and neck pain.  Skin: Negative for itching and rash.  Neurological: Negative for dizziness, tremors and weakness.  Endo/Heme/Allergies: Does not bruise/bleed easily.  Psychiatric/Behavioral: Negative for depression. The patient is not nervous/anxious and does not have insomnia.     Objective: BP 110/70   Ht 5\' 2"  (1.575 m)   Wt 199 lb (90.3 kg)   BMI 36.40 kg/m  Physical Exam Constitutional:      General: She is not in acute distress.    Appearance: She is well-developed.  Genitourinary:     Pelvic exam was performed with patient supine.     Vagina and uterus normal.     No vaginal erythema or bleeding.     No cervical motion tenderness, discharge, polyp or nabothian cyst.     Uterus is mobile.     Uterus is not enlarged.     No uterine mass detected.    Uterus is midaxial.     No right or left adnexal mass present.     Right adnexa not tender.     Left adnexa not tender.  HENT:     Head: Normocephalic and atraumatic.     Nose: Nose normal.  Abdominal:     General: There is no distension.     Palpations: Abdomen is soft.     Tenderness: There is no abdominal tenderness.  Musculoskeletal: Normal range of motion.  Neurological:     Mental Status: She is alert and oriented to person, place, and time.     Cranial Nerves: No cranial nerve deficit.  Skin:    General:  Skin is warm and dry.  Psychiatric:        Attention and Perception: Attention normal.        Mood and Affect: Mood and affect normal.        Speech: Speech normal.        Behavior: Behavior normal.        Thought Content: Thought content normal.        Judgment: Judgment normal.     Microscopic wet-mount exam shows clue cells, hyphae.  ASSESSMENT/PLAN:     Problem List Items Addressed This Visit      Endocrine   PCOS (polycystic ovarian syndrome)     Other   Obesity (BMI 30-39.9)   Relevant Medications   phentermine (ADIPEX-P) 37.5 MG tablet    Other Visit Diagnoses    Bacterial vaginal infection    -  Primary   Relevant Medications   fluconazole (DIFLUCAN) 150 MG tablet   metroNIDAZOLE (FLAGYL) 500 MG tablet   Other Relevant Orders   Cervicovaginal ancillary only   Yeast infection       Relevant Medications   fluconazole (DIFLUCAN) 150 MG tablet   metroNIDAZOLE (FLAGYL) 500 MG tablet    Consider tx for recurrent BV based on culture results    Tx both at this time  Weight loss counseling, restart medicine    F/u one month Annamarie Major, MD, Regina Chambers Ob/Gyn, [facility] 11/16/2018  4:29 PM

## 2018-11-16 NOTE — Patient Instructions (Signed)
Phentermine tablets or capsules What is this medicine? PHENTERMINE (FEN ter meen) decreases your appetite. It is used with a reduced calorie diet and exercise to help you lose weight. This medicine may be used for other purposes; ask your health care provider or pharmacist if you have questions. COMMON BRAND NAME(S): Adipex-P, Atti-Plex P, Atti-Plex P Spansule, Fastin, Lomaira, Pro-Fast, Tara-8 What should I tell my health care provider before I take this medicine? They need to know if you have any of these conditions:  agitation or nervousness  diabetes  glaucoma  heart disease  high blood pressure  history of drug abuse or addiction  history of stroke  kidney disease  lung disease called Primary Pulmonary Hypertension (PPH)  taken an MAOI like Carbex, Eldepryl, Marplan, Nardil, or Parnate in last 14 days  taking stimulant medicines for attention disorders, weight loss, or to stay awake  thyroid disease  an unusual or allergic reaction to phentermine, other medicines, foods, dyes, or preservatives  pregnant or trying to get pregnant  breast-feeding How should I use this medicine? Take this medicine by mouth with a glass of water. Follow the directions on the prescription label. The instructions for use may differ based on the product and dose you are taking. Avoid taking this medicine in the evening. It may interfere with sleep. Take your doses at regular intervals. Do not take your medicine more often than directed. Talk to your pediatrician regarding the use of this medicine in children. While this drug may be prescribed for children 17 years or older for selected conditions, precautions do apply. Overdosage: If you think you have taken too much of this medicine contact a poison control center or emergency room at once. NOTE: This medicine is only for you. Do not share this medicine with others. What if I miss a dose? If you miss a dose, take it as soon as you can. If it  is almost time for your next dose, take only that dose. Do not take double or extra doses. What may interact with this medicine? Do not take this medicine with any of the following medications:  MAOIs like Carbex, Eldepryl, Marplan, Nardil, and Parnate  medicines for colds or breathing difficulties like pseudoephedrine or phenylephrine  procarbazine  sibutramine  stimulant medicines for attention disorders, weight loss, or to stay awake This medicine may also interact with the following medications:  certain medicines for depression, anxiety, or psychotic disturbances  linezolid  medicines for diabetes  medicines for high blood pressure This list may not describe all possible interactions. Give your health care provider a list of all the medicines, herbs, non-prescription drugs, or dietary supplements you use. Also tell them if you smoke, drink alcohol, or use illegal drugs. Some items may interact with your medicine. What should I watch for while using this medicine? Notify your physician immediately if you become short of breath while doing your normal activities. Do not take this medicine within 6 hours of bedtime. It can keep you from getting to sleep. Avoid drinks that contain caffeine and try to stick to a regular bedtime every night. This medicine was intended to be used in addition to a healthy diet and exercise. The best results are achieved this way. This medicine is only indicated for short-term use. Eventually your weight loss may level out. At that point, the drug will only help you maintain your new weight. Do not increase or in any way change your dose without consulting your doctor. You may get   drowsy or dizzy. Do not drive, use machinery, or do anything that needs mental alertness until you know how this medicine affects you. Do not stand or sit up quickly, especially if you are an older patient. This reduces the risk of dizzy or fainting spells. Alcohol may increase  dizziness and drowsiness. Avoid alcoholic drinks. What side effects may I notice from receiving this medicine? Side effects that you should report to your doctor or health care professional as soon as possible:  allergic reactions like skin rash, itching or hives, swelling of the face, lips, or tongue)  anxiety  breathing problems  changes in vision  chest pain or chest tightness  depressed mood or other mood changes  hallucinations, loss of contact with reality  fast, irregular heartbeat  increased blood pressure  irritable  nervousness or restlessness  painful urination  palpitations  tremors  trouble sleeping  seizures  signs and symptoms of a stroke like changes in vision; confusion; trouble speaking or understanding; severe headaches; sudden numbness or weakness of the face, arm or leg; trouble walking; dizziness; loss of balance or coordination  unusually weak or tired  vomiting Side effects that usually do not require medical attention (report to your doctor or health care professional if they continue or are bothersome):  constipation or diarrhea  dry mouth  headache  nausea  stomach upset  sweating This list may not describe all possible side effects. Call your doctor for medical advice about side effects. You may report side effects to FDA at 1-800-FDA-1088. Where should I keep my medicine? Keep out of the reach of children. This medicine can be abused. Keep your medicine in a safe place to protect it from theft. Do not share this medicine with anyone. Selling or giving away this medicine is dangerous and against the law. This medicine may cause accidental overdose and death if taken by other adults, children, or pets. Mix any unused medicine with a substance like cat litter or coffee grounds. Then throw the medicine away in a sealed container like a sealed bag or a coffee can with a lid. Do not use the medicine after the expiration date. Store at  room temperature between 20 and 25 degrees C (68 and 77 degrees F). Keep container tightly closed. NOTE: This sheet is a summary. It may not cover all possible information. If you have questions about this medicine, talk to your doctor, pharmacist, or health care provider.  2020 Elsevier/Gold Standard (2016-07-09 08:23:13)  

## 2018-11-22 ENCOUNTER — Telehealth: Payer: Self-pay

## 2018-11-22 ENCOUNTER — Other Ambulatory Visit: Payer: Self-pay | Admitting: Obstetrics & Gynecology

## 2018-11-22 MED ORDER — FLUCONAZOLE 150 MG PO TABS: 150.0000 mg | ORAL_TABLET | Freq: Once | ORAL | 1 refills | Status: AC

## 2018-11-22 NOTE — Telephone Encounter (Signed)
Unable to reach patient VM is full

## 2018-11-22 NOTE — Telephone Encounter (Signed)
Patient requesting refill of Diflucan as she is still having symptoms. KM#[phone]

## 2018-11-22 NOTE — Telephone Encounter (Signed)
Mychart message sent.

## 2018-11-23 NOTE — Telephone Encounter (Signed)
Pt aware.

## 2018-11-27 LAB — CERVICOVAGINAL ANCILLARY ONLY
Bacterial Vaginitis (gardnerella): NEGATIVE
Candida Glabrata: NEGATIVE
Candida Vaginitis: NEGATIVE
Comment: NEGATIVE
Comment: NEGATIVE
Comment: NEGATIVE
Comment: NEGATIVE
Trichomonas: NEGATIVE

## 2018-12-05 ENCOUNTER — Telehealth: Payer: Self-pay

## 2018-12-05 NOTE — Telephone Encounter (Signed)
Can you call this pt and find out a good time for her to do this? Thank you

## 2018-12-05 NOTE — Telephone Encounter (Signed)
I think if she had negative cultures then it isn't candida or BV.  She probably needs to be seen.  This is non-emergent and does not need to be an overbook

## 2018-12-05 NOTE — Telephone Encounter (Signed)
Patient is schedule for 12/07/18 with ABC

## 2018-12-05 NOTE — Telephone Encounter (Signed)
Pt was previously seen by [facility] for this problem and was given Flagyl and Diflucan even though she tested negative for both BV and yeast. Pt is stating she is still having the symptoms of white vaginal discharge, and irritation with no vaginal odor. I have called pt to advise her Kimberly is not in office but I would be in contact with [facility]MS in regards to this. Please advise, Thank you.

## 2018-12-06 NOTE — Progress Notes (Signed)
Lorre Munroe, NP   Chief Complaint  Patient presents with  . Vaginal Discharge    irritation, no itchiness or odor x since Monday  . [employer] Employee    HPI:      Regina Chambers is a 28 y.o. G2P1011 who LMP was No LMP recorded. (Menstrual status: IUD)., presents today for recurrent vaginal d/c with ext irritation, no odor, for several months. Irritation is more troublesome than the d/c. Treated for Yeast vag/BV 10/20 with diflucan and flagyl with temporary relief. Neg culture for yeast/BV. Retreated with diflucan 2 wks ago with temp relief again. Pt uses dove soap, dryer sheets, scented wipes, and bath bombs. She is sex active, no new partners.    Patient Active Problem List   Diagnosis Date Noted  . 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 Obesity (BMI 30-39.9) 08/18/2017  . PCOS (polycystic ovarian syndrome) 04/10/2015    Past Surgical History:  Procedure Laterality Date  . CERVICAL CERCLAGE  2016  . CESAREAN SECTION N/A 09/02/2014   Procedure: CESAREAN SECTION;  Surgeon: [name], MD;  Location: [facility] ORS;  Service: Obstetrics;  Laterality: N/A;  . CHOLECYSTECTOMY N/A 05/02/2015   Procedure: LAPAROSCOPIC CHOLECYSTECTOMY WITH INTRAOPERATIVE CHOLANGIOGRAM;  Surgeon: Nadeen Landau, MD;  Location: [facility] ORS;  Service: General;  Laterality: N/A;  . INTRAUTERINE DEVICE (IUD) INSERTION  10/29/2014   Lile[place]ta  . LAPAROSCOPIC GASTRIC SLEEVE RESECTION N/A 04/25/2018   Procedure: LAPAROSCOPIC GASTRIC SLEEVE RESECTION, UPPER ENDO, ERAS,  HIATAL HERNIA REPAIR;  Surgeon: Gaynelle Adu, MD;  Location: [facility]S;  Service: General;  Laterality: N/A;  . UPPER GI ENDOSCOPY  04/25/2018   Procedure: UPPER GI ENDOSCOPY;  Surgeon: Gaynelle Adu, MD;  Location: [facility] ORS;  Service: General;;    Family History  Problem Relation Age of Onset  . Heart disease Father   . Kidney disease Father   . Hypertension Father     Social History   Socioeconomic History  .  Marital status: Married    Spouse name: Not on file  . Number of children: Not on file  . Years of education: Not on file  . Highest education level: Not on file  Occupational History  . Not on file  Social Needs  . Financial resource strain: Not on file  . Food insecurity    Worry: Not on file    Inability: Not on file  . Transportation needs    Medical: Not on file    Non-medical: Not on file  Tobacco Use  . Smoking status: Never Smoker  . Smokeless tobacco: Never Used  Substance and Sexual Activity  . Alcohol use: No  . Drug use: No  . Sexual activity: Yes    Birth control/protection: I.U.D.    Comment: Mirena  Lifestyle  . Physical activity    Days per week: Not on file    Minutes per session: Not on file  . Stress: Not on file  Relationships  . Social Musician on phone: Not on file    Gets together: Not on file    Attends religious service: Not on file    Active member of club or organization: Not on file    Attends meetings of clubs or organizations: Not on file    Relationship status: Not on file  . Intimate partner violence    Fear of current or ex partner: Not on file    Emotionally abused: Not on file    Physically  abused: Not on file    Forced sexual activity: Not on file  Other Topics Concern  . Not on file  Social History Narrative  . Not on file    Outpatient Medications Prior to Visit  Medication Sig Dispense Refill  . levonorgestrel (MIRENA) 20 MCG/24HR IUD 1 each by Intrauterine route once. Inserted 10/2014    . phentermine (ADIPEX-P) 37.5 MG tablet Take 1 tablet (37.5 mg total) by mouth daily before breakfast. 30 tablet 0  . metroNIDAZOLE (FLAGYL) 500 MG tablet Take 1 tablet (500 mg total) by mouth 2 (two) times daily. 14 tablet 0  . ondansetron (ZOFRAN-ODT) 4 MG disintegrating tablet Take 1 tablet (4 mg total) by mouth every 6 (six) hours as needed for nausea or vomiting. (Patient not taking: Reported on 11/16/2018) 20 tablet 0  .  pantoprazole (PROTONIX) 40 MG tablet Take 1 tablet (40 mg total) by mouth daily. (Patient not taking: Reported on 11/16/2018) 90 tablet 0   No facility-administered medications prior to visit.       ROS:  Review of Systems  Constitutional: Negative for fever.  Gastrointestinal: Negative for blood in stool, constipation, diarrhea, nausea and vomiting.  Genitourinary: Positive for vaginal discharge. Negative for dyspareunia, dysuria, flank pain, frequency, hematuria, urgency, vaginal bleeding and vaginal pain.  Musculoskeletal: Negative for back pain.  Skin: Negative for rash.   BREAST: No symptoms   OBJECTIVE:   Vitals:  BP 110/80   Ht 5\' 2"  (1.575 m)   Wt 196 lb (88.9 kg)   BMI 35.85 kg/m   Physical Exam Vitals signs reviewed.  Constitutional:      Appearance: She is well-developed.  Neck:     Musculoskeletal: Normal range of motion.  Pulmonary:     Effort: Pulmonary effort is normal.  Genitourinary:    General: Normal vulva.     Pubic Area: No rash.      Labia:        Right: No rash, tenderness or lesion.        Left: No rash, tenderness or lesion.      Vagina: Normal. No vaginal discharge, erythema or tenderness.     Cervix: Normal.     Uterus: Normal. Not enlarged and not tender.      Adnexa: Right adnexa normal and left adnexa normal.       Right: No mass or tenderness.         Left: No mass or tenderness.      Musculoskeletal: Normal range of motion.  Skin:    General: Skin is warm and dry.  Neurological:     General: No focal deficit present.     Mental Status: She is alert and oriented to person, place, and time.  Psychiatric:        Mood and Affect: Mood normal.        Behavior: Behavior normal.        Thought Content: Thought content normal.        Judgment: Judgment normal.     Results: Results for orders placed or performed in visit on 12/07/18 (from the past 24 hour(s))  POCT Wet Prep with KOH     Status: Normal   Collection Time: 12/07/18   9:30 AM  Result Value Ref Range   Trichomonas, UA Negative    Clue Cells Wet Prep HPF POC neg    Epithelial Wet Prep HPF POC     Yeast Wet Prep HPF POC neg    Bacteria Wet Prep HPF  POC     RBC Wet Prep HPF POC     WBC Wet Prep HPF POC     KOH Prep POC Negative Negative     Assessment/Plan: Subacute vaginitis - Plan: clotrimazole-betamethasone (LOTRISONE) cream, Other/Misc lab test; Persistent sx with neg wet prep/exam. Check One Swab AV, BV and yeast. Will call with results.  If neg, sx c/w chemical derm. In meantime, change to dove sens skin soap, line dry underwear, unscented or no wipes, no bath bombs. Rx lotrisone crm. F/u prn.   Vaginal discharge - Plan: POCT Wet Prep with KOH, Other/Misc lab test   Meds ordered this encounter  Medications  . clotrimazole-betamethasone (LOTRISONE) cream    Sig: Apply externally BID prn sx up to 2 wks    Dispense:  15 g    Refill:  0    Order Specific Question:   Supervising Provider    Answer:   Gae Dry [[id number]]      Return if symptoms worsen or fail to improve.  Hager Compston B. Dois Juarbe, PA-C 12/07/2018 9:33 AM

## 2018-12-07 ENCOUNTER — Other Ambulatory Visit: Payer: Self-pay

## 2018-12-07 ENCOUNTER — Ambulatory Visit: Payer: Managed Care, Other (non HMO) | Admitting: Obstetrics and Gynecology

## 2018-12-07 ENCOUNTER — Encounter: Payer: Self-pay | Admitting: Obstetrics and Gynecology

## 2018-12-07 VITALS — BP 110/80 | Ht 62.0 in | Wt 196.0 lb

## 2018-12-07 DIAGNOSIS — N761 Subacute and chronic vaginitis: Secondary | ICD-10-CM | POA: Diagnosis not present

## 2018-12-07 DIAGNOSIS — N898 Other specified noninflammatory disorders of vagina: Secondary | ICD-10-CM | POA: Diagnosis not present

## 2018-12-07 LAB — POCT WET PREP WITH KOH
Clue Cells Wet Prep HPF POC: NEGATIVE
KOH Prep POC: NEGATIVE
Trichomonas, UA: NEGATIVE
Yeast Wet Prep HPF POC: NEGATIVE

## 2018-12-07 MED ORDER — CLOTRIMAZOLE-BETAMETHASONE 1-0.05 % EX CREA: TOPICAL_CREAM | CUTANEOUS | 0 refills | Status: DC

## 2018-12-07 NOTE — Patient Instructions (Signed)
I value your feedback and entrusting us with your care. If you get a [facility] patient survey, I would appreciate you taking the time to let us know about your experience today. Thank you! 

## 2018-12-14 ENCOUNTER — Telehealth: Payer: Self-pay | Admitting: Obstetrics and Gynecology

## 2018-12-14 DIAGNOSIS — N898 Other specified noninflammatory disorders of vagina: Secondary | ICD-10-CM

## 2018-12-14 DIAGNOSIS — N761 Subacute and chronic vaginitis: Secondary | ICD-10-CM

## 2018-12-14 MED ORDER — MOXIFLOXACIN HCL 400 MG PO TABS: 400.0000 mg | ORAL_TABLET | Freq: Every day | ORAL | 0 refills | Status: AC

## 2018-12-14 MED ORDER — FLUCONAZOLE 150 MG PO TABS: 150.0000 mg | ORAL_TABLET | Freq: Once | ORAL | 0 refills | Status: AC

## 2018-12-14 NOTE — Telephone Encounter (Signed)
Pt aware of neg BV and yeast on culture. Did show 2 types AV (E. Coli and entero faecalis). Pt still with d/c and irritation. Has removed all scented items as we discussed. Itch improved with lotrisone crm. Rx avelox for AV and diflucan to prevent yeast. Add probiotics. F/u prn sx.

## 2019-02-05 ENCOUNTER — Encounter (HOSPITAL_COMMUNITY): Payer: Self-pay

## 2019-04-05 ENCOUNTER — Telehealth: Payer: Self-pay

## 2019-04-05 ENCOUNTER — Other Ambulatory Visit: Payer: Self-pay | Admitting: Obstetrics & Gynecology

## 2019-04-05 MED ORDER — FLUCONAZOLE 150 MG PO TABS: 150.0000 mg | ORAL_TABLET | Freq: Once | ORAL | 3 refills | Status: AC

## 2019-04-05 NOTE — Telephone Encounter (Signed)
Rx done. 

## 2019-04-05 NOTE — Telephone Encounter (Signed)
Pt calling for refill of diflucan; has recurring yeast inf.  [phone]

## 2019-04-05 NOTE — Telephone Encounter (Signed)
Pt aware.

## 2019-05-22 ENCOUNTER — Telehealth: Payer: Self-pay

## 2019-05-22 NOTE — Telephone Encounter (Signed)
Can you please schedule?

## 2019-05-22 NOTE — Telephone Encounter (Signed)
Consider appt for testing, as it has been 6 mos.

## 2019-05-22 NOTE — Telephone Encounter (Signed)
Pt calling and states that diflucan or the cream has not worked and still having same issues. Please advise if you want to try something else, she said she had talked with you about trying something different

## 2019-05-23 ENCOUNTER — Ambulatory Visit (INDEPENDENT_AMBULATORY_CARE_PROVIDER_SITE_OTHER): Payer: Managed Care, Other (non HMO) | Admitting: Obstetrics and Gynecology

## 2019-05-23 ENCOUNTER — Other Ambulatory Visit: Payer: Self-pay

## 2019-05-23 ENCOUNTER — Encounter: Payer: Self-pay | Admitting: Obstetrics and Gynecology

## 2019-05-23 VITALS — BP 114/80 | Ht 62.0 in | Wt 195.0 lb

## 2019-05-23 DIAGNOSIS — N761 Subacute and chronic vaginitis: Secondary | ICD-10-CM

## 2019-05-23 DIAGNOSIS — N898 Other specified noninflammatory disorders of vagina: Secondary | ICD-10-CM | POA: Diagnosis not present

## 2019-05-23 LAB — POCT WET PREP WITH KOH
Clue Cells Wet Prep HPF POC: NEGATIVE
KOH Prep POC: NEGATIVE
Trichomonas, UA: NEGATIVE
Yeast Wet Prep HPF POC: NEGATIVE

## 2019-05-23 MED ORDER — CLOTRIMAZOLE-BETAMETHASONE 1-0.05 % EX CREA: TOPICAL_CREAM | CUTANEOUS | 0 refills | Status: DC

## 2019-05-23 NOTE — Patient Instructions (Signed)
I value your feedback and entrusting us with your care. If you get a [facility] patient survey, I would appreciate you taking the time to let us know about your experience today. Thank you!  As of January 18, 2019, your lab results will be released to your MyChart immediately, before I even have a chance to see them. Please give me time to review them and contact you if there are any abnormalities. Thank you for your patience.  

## 2019-05-23 NOTE — Telephone Encounter (Signed)
Patient is schedule 05/23/19 at 3 pm with ABC

## 2019-05-23 NOTE — Progress Notes (Signed)
Lorre Munroe, NP   Chief Complaint  Patient presents with  . Vaginal Discharge    odor and irritation, no itchiness x on/off half a year  . [employer] Employee    HPI:      Ms. Regina Chambers is a 29 y.o. G2P1011 who LMP was No LMP recorded. (Menstrual status: IUD)., presents today for chronic vaginitis since 10/20. Gets increased white, clumpy d/c with vaginal irritation and ext discomfort with sex. Has treated with diflucan and monistat on her own since last seen 10/20, monistat last wk. Sx improve for a couple wks and then recur. No recent terazol use. Has tried lotrisone crm 10/20. Is taking probiotics daily, using boric acid supp QHS. Using dove sens skin soap, no dryer sheets/bath bombs/scented wipes. No change with hygiene changes since 10/20 appt. Not using condoms. Pt very frustrated.  Neg BV/yeast and AV on 12/07/18 culture, although showed 2 types of AV and pt treated with avelox due to sx without sx improvement. Neg yeast/BV on 11/16/18 culture.   Past Medical History:  Diagnosis Date  . Anxiety   . Blood type, Rh positive   . Cervical insufficiency in pregnancy, antepartum    prior pregnancy  . GERD (gastroesophageal reflux disease)   . Obesity   . Polycystic disease, ovaries     Past Surgical History:  Procedure Laterality Date  . CERVICAL CERCLAGE  2016  . CESAREAN SECTION N/A 09/02/2014   Procedure: CESAREAN SECTION;  Surgeon: [name], MD;  Location: [facility] ORS;  Service: Obstetrics;  Laterality: N/A;  . CHOLECYSTECTOMY N/A 05/02/2015   Procedure: LAPAROSCOPIC CHOLECYSTECTOMY WITH INTRAOPERATIVE CHOLANGIOGRAM;  Surgeon: Nadeen Landau, MD;  Location: [facility] ORS;  Service: General;  Laterality: N/A;  . INTRAUTERINE DEVICE (IUD) INSERTION  10/29/2014   Liletta  . LAPAROSCOPIC GASTRIC SLEEVE RESECTION N/A 04/25/2018   Procedure: LAPAROSCOPIC GASTRIC SLEEVE RESECTION, UPPER ENDO, ERAS,  HIATAL HERNIA REPAIR;  Surgeon: Gaynelle Adu, MD;  Location: [facility];  Service: General;  Laterality: N/A;  . UPPER GI ENDOSCOPY  04/25/2018   Procedure: UPPER GI ENDOSCOPY;  Surgeon: Gaynelle Adu, MD;  Location: [facility];  Service: General;;    Family History  Problem Relation Age of Onset  . Heart disease Father   . Kidney disease Father   . Hypertension Father     Social History   Socioeconomic History  . Marital status: Married    Spouse name: Not on file  . Number of children: Not on file  . Years of education: Not on file  . Highest education level: Not on file  Occupational History  . Not on file  Tobacco Use  . Smoking status: Never Smoker  . Smokeless tobacco: Never Used  Substance and Sexual Activity  . Alcohol use: No  . Drug use: No  . Sexual activity: Yes    Birth control/protection: I.U.D.    Comment: Mirena  Other Topics Concern  . Not on file  Social History Narrative  . Not on file   Social Determinants of Health   Financial Resource Strain:   . Difficulty of Paying Living Expenses:   Food Insecurity:   . Worried About Programme researcher, broadcasting/film/video in the Last Year:   . Barista in the Last Year:   Transportation Needs:   . Freight forwarder (Medical):   Marland Kitchen Lack of Transportation (Non-Medical):   Physical Activity:   . Days of Exercise per Week:   . Minutes of  Exercise per Session:   Stress:   . Feeling of Stress :   Social Connections:   . Frequency of Communication with Friends and Family:   . Frequency of Social Gatherings with Friends and Family:   . Attends Religious Services:   . Active Member of Clubs or Organizations:   . Attends Archivist Meetings:   Marland Kitchen Marital Status:   Intimate Partner Violence:   . Fear of Current or Ex-Partner:   . Emotionally Abused:   Marland Kitchen Physically Abused:   . Sexually Abused:     Outpatient Medications Prior to Visit  Medication Sig Dispense Refill  . levonorgestrel (MIRENA) 20 MCG/24HR IUD 1 each by Intrauterine route once. Inserted 10/2014    . sertraline  (ZOLOFT) 50 MG tablet Take by mouth.    . clotrimazole-betamethasone (LOTRISONE) cream Apply externally BID prn sx up to 2 wks 15 g 0  . phentermine (ADIPEX-P) 37.5 MG tablet Take 1 tablet (37.5 mg total) by mouth daily before breakfast. 30 tablet 0   No facility-administered medications prior to visit.      ROS:  Review of Systems  Constitutional: Negative for fever.  Gastrointestinal: Negative for blood in stool, constipation, diarrhea, nausea and vomiting.  Genitourinary: Positive for dyspareunia and vaginal discharge. Negative for dysuria, flank pain, frequency, hematuria, urgency, vaginal bleeding and vaginal pain.  Musculoskeletal: Negative for back pain.  Skin: Negative for rash.    OBJECTIVE:   Vitals:  BP 114/80   Ht 5\' 2"  (1.575 m)   Wt 195 lb (88.5 kg)   BMI 35.67 kg/m   Physical Exam Vitals reviewed.  Constitutional:      Appearance: She is well-developed.  Pulmonary:     Effort: Pulmonary effort is normal.  Genitourinary:    General: Normal vulva.     Pubic Area: No rash.      Labia:        Right: No rash, tenderness or lesion.        Left: No rash, tenderness or lesion.      Vagina: Normal. No vaginal discharge, erythema or tenderness.     Cervix: Normal.     Uterus: Normal. Not enlarged and not tender.      Adnexa: Right adnexa normal and left adnexa normal.       Right: No mass or tenderness.         Left: No mass or tenderness.       Comments: MILD EVIDENCE OF CHRONIC IRRITATION BILAT LABIA MINORA; NO ERYTHEMA  Musculoskeletal:        General: Normal range of motion.     Cervical back: Normal range of motion.  Skin:    General: Skin is warm and dry.  Neurological:     General: No focal deficit present.     Mental Status: She is alert and oriented to person, place, and time.  Psychiatric:        Mood and Affect: Mood normal.        Behavior: Behavior normal.        Thought Content: Thought content normal.        Judgment: Judgment normal.      Results: Results for orders placed or performed in visit on 05/23/19 (from the past 24 hour(s))  POCT Wet Prep with KOH     Status: Normal   Collection Time: 05/23/19  3:44 PM  Result Value Ref Range   Trichomonas, UA Negative    Clue Cells Wet Prep HPF POC  neg    Epithelial Wet Prep HPF POC     Yeast Wet Prep HPF POC neg    Bacteria Wet Prep HPF POC     RBC Wet Prep HPF POC     WBC Wet Prep HPF POC     KOH Prep POC Negative Negative     Assessment/Plan: Chronic vaginitis - Plan: POCT Wet Prep with KOH, clotrimazole-betamethasone (LOTRISONE) cream, NuSwab Vaginitis (VG); Neg wet prep/neg exam. Try lotrisone crm ext. Chck culture again. Condoms to prevent pH imbalance. Will f/u with results.   Vaginal discharge   Meds ordered this encounter  Medications  . clotrimazole-betamethasone (LOTRISONE) cream    Sig: Apply externally BID prn sx up to 2 wks    Dispense:  15 g    Refill:  0    Order Specific Question:   Supervising Provider    Answer:   Nadara Mustard [[id number]]      Return if symptoms worsen or fail to improve.  Nessie Nong B. Gennett Garcia, PA-C 05/23/2019 3:47 PM

## 2019-05-25 LAB — NUSWAB VAGINITIS (VG)
Candida albicans, NAA: NEGATIVE
Candida glabrata, NAA: NEGATIVE
Trich vag by NAA: NEGATIVE

## 2019-07-31 ENCOUNTER — Encounter: Payer: Self-pay | Admitting: Obstetrics and Gynecology

## 2019-07-31 ENCOUNTER — Other Ambulatory Visit: Payer: Self-pay

## 2019-07-31 ENCOUNTER — Ambulatory Visit (INDEPENDENT_AMBULATORY_CARE_PROVIDER_SITE_OTHER): Payer: Managed Care, Other (non HMO) | Admitting: Obstetrics and Gynecology

## 2019-07-31 VITALS — BP 116/70 | HR 67 | Ht 62.0 in | Wt 204.0 lb

## 2019-07-31 DIAGNOSIS — Z6837 Body mass index (BMI) 37.0-37.9, adult: Secondary | ICD-10-CM | POA: Diagnosis not present

## 2019-07-31 DIAGNOSIS — E669 Obesity, unspecified: Secondary | ICD-10-CM | POA: Diagnosis not present

## 2019-07-31 MED ORDER — PHENTERMINE HCL 37.5 MG PO TABS: 37.5000 mg | ORAL_TABLET | Freq: Every day | ORAL | 0 refills | Status: DC

## 2019-07-31 NOTE — Progress Notes (Signed)
Gynecology Office Visit  Chief Complaint:  Chief Complaint  Patient presents with  . Weight Gain    discuss weight loss options    History of Present Illness: Patientis a 29 y.o. G64P1011 female, who presents for the evaluation of weight gain. She has gained 9 pounds primarily over 12 months. The patient states the following issues have contributed to her weight problem: decreased time for physical activity.  The patient has no additional symptoms. The patient specifically denies memory loss, muscle weakness, excessive thirst, and polyuria. Weight related co-morbidities include none. The patient's past medical history is notable for prior gastric sleeve 04/25/2018. She has tried phentermine interventions in the past with modest success.   Review of Systems: 10 point review of systems negative unless otherwise noted in HPI  Past Medical History:  Patient Active Problem List   Diagnosis Date Noted  . Vaginal itching 05/23/2019  . 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 Obesity (BMI 30-39.9) 08/18/2017  . PCOS (polycystic ovarian syndrome) 04/10/2015    Past Surgical History:  Past Surgical History:  Procedure Laterality Date  . CERVICAL CERCLAGE  2016  . CESAREAN SECTION N/A 09/02/2014   Procedure: CESAREAN SECTION;  Surgeon: [name], MD;  Location: [facility];  Service: Obstetrics;  Laterality: N/A;  . CHOLECYSTECTOMY N/A 05/02/2015   Procedure: LAPAROSCOPIC CHOLECYSTECTOMY WITH INTRAOPERATIVE CHOLANGIOGRAM;  Surgeon: Nadeen Landau, MD;  Location: [facility];  Service: General;  Laterality: N/A;  . INTRAUTERINE DEVICE (IUD) INSERTION  10/29/2014   Lile[place]ta  . LAPAROSCOPIC GASTRIC SLEEVE RESECTION N/A 04/25/2018   Procedure: LAPAROSCOPIC GASTRIC SLEEVE RESECTION, UPPER ENDO, ERAS,  HIATAL HERNIA REPAIR;  Surgeon: Gaynelle Adu, MD;  Location: [facility];  Service: General;  Laterality: N/A;  . UPPER GI ENDOSCOPY  04/25/2018   Procedure: UPPER GI ENDOSCOPY;  Surgeon:  Gaynelle Adu, MD;  Location: [facility];  Service: General;;    Gynecologic History: No LMP recorded. (Menstrual status: IUD).  Obstetric History: G2P1011  Family History:  Family History  Problem Relation Age of Onset  . Heart disease Father   . Kidney disease Father   . Hypertension Father     Social History:  Social History   Socioeconomic History  . Marital status: Married    Spouse name: Not on file  . Number of children: Not on file  . Years of education: Not on file  . Highest education level: Not on file  Occupational History  . Not on file  Tobacco Use  . Smoking status: Never Smoker  . Smokeless tobacco: Never Used  Vaping Use  . Vaping Use: Never used  Substance and Sexual Activity  . Alcohol use: No  . Drug use: No  . Sexual activity: Yes    Birth control/protection: I.U.D.    Comment: Mirena  Other Topics Concern  . Not on file  Social History Narrative  . Not on file   Social Determinants of Health   Financial Resource Strain:   . Difficulty of Paying Living Expenses:   Food Insecurity:   . Worried About Programme researcher, broadcasting/film/video in the Last Year:   . Barista in the Last Year:   Transportation Needs:   . Freight forwarder (Medical):   Marland Kitchen Lack of Transportation (Non-Medical):   Physical Activity:   . Days of Exercise per Week:   . Minutes of Exercise per Session:   Stress:   . Feeling of Stress :   Social Connections:   . Frequency  of Communication with Friends and Family:   . Frequency of Social Gatherings with Friends and Family:   . Attends Religious Services:   . Active Member of Clubs or Organizations:   . Attends Archivist Meetings:   Marland Kitchen Marital Status:   Intimate Partner Violence:   . Fear of Current or Ex-Partner:   . Emotionally Abused:   Marland Kitchen Physically Abused:   . Sexually Abused:     Allergies:  No Known Allergies  Medications: Prior to Admission medications   Medication Sig Start Date End Date Taking?  Authorizing Provider  levonorgestrel (MIRENA) 20 MCG/24HR IUD 1 each by Intrauterine route once. Inserted 10/2014   Yes [provider]  sertraline (ZOLOFT) 50 MG tablet Take by mouth. 05/10/19 05/09/20 Yes [provider]    Physical Exam Blood pressure 116/70, pulse 67, height 5\' 2"  (1.575 m), weight 204 lb (92.5 kg). No LMP recorded. (Menstrual status: IUD). Body mass index is 37.31 kg/m.  General: NAD HEENT: normocephalic, anicteric Thyroid: no enlargement Pulmonary: no increased work of breathing Neurologic: Grossly intact Psychiatric: mood appropriate, affect full  Assessment: 28 y.o. G2P1011 presenting for discussion of weight loss management options  Plan: Problem List Items Addressed This Visit    None    Visit Diagnoses    Class 2 obesity without serious comorbidity with body mass index (BMI) of 37.0 to 37.9 in adult, unspecified obesity type    -  Primary   Relevant Medications   phentermine (ADIPEX-P) 37.5 MG tablet      1) 1500 Calorie ADA Diet  2) Patient education given regarding appropriate lifestyle changes for weight loss including: regular physical activity, healthy coping strategies, caloric restriction and healthy eating patterns.  3) Patient will be started on weight loss medication. The risks and benefits and side effects of medication, such as Adipex (Phenteramine) ,  Tenuate (Diethylproprion), Belviq (lorcarsin), Contrave (buproprion/naltrexone), Qsymia (phentermine/topiramate), and Saxenda (liraglutide) is discussed. The pros and cons of suppressing appetite and boosting metabolism is discussed. Risks of tolerence and addiction is discussed for selected agents discussed. Use of medicine will ne short term, such as 3-4 months at a time followed by a period of time off of the medicine to avoid these risks and side effects for Adipex, Qsymia, and Tenuate discussed. Pt to call with any negative side effects and agrees to keep follow up  appts.  4) Comorbidity Screening - hypothyroidism screening, diabetes, and hyperlipidemia screening offered  5) Encouraged weekly weight monitorig to track progress and sample 1 week food diary  6) 15 minutes face-to-face; counseling/coordination of care > 50 percent of visit  7) Return in about 4 weeks (around 08/28/2019) for medication follow up (phone, video, or in office).   Malachy Mood, MD, [facility], [facility] 07/31/2019, 3:30 PM

## 2019-08-31 ENCOUNTER — Other Ambulatory Visit: Payer: Self-pay | Admitting: Obstetrics and Gynecology

## 2019-08-31 ENCOUNTER — Telehealth: Payer: Self-pay

## 2019-08-31 MED ORDER — FLUCONAZOLE 150 MG PO TABS: 150.0000 mg | ORAL_TABLET | Freq: Once | ORAL | 0 refills | Status: AC

## 2019-08-31 NOTE — Telephone Encounter (Signed)
Rx has been sent  

## 2019-08-31 NOTE — Telephone Encounter (Signed)
Pt called triage reporting she has a yeast infection , would like AMS to send in a rx

## 2019-10-31 ENCOUNTER — Ambulatory Visit: Payer: Managed Care, Other (non HMO) | Admitting: Obstetrics and Gynecology

## 2019-11-13 ENCOUNTER — Other Ambulatory Visit: Payer: Self-pay

## 2019-11-13 ENCOUNTER — Ambulatory Visit (INDEPENDENT_AMBULATORY_CARE_PROVIDER_SITE_OTHER): Payer: Managed Care, Other (non HMO) | Admitting: Obstetrics and Gynecology

## 2019-11-13 VITALS — BP 122/74 | Ht 62.0 in | Wt 201.0 lb

## 2019-11-13 DIAGNOSIS — Z124 Encounter for screening for malignant neoplasm of cervix: Secondary | ICD-10-CM | POA: Diagnosis not present

## 2019-11-13 DIAGNOSIS — Z6836 Body mass index (BMI) 36.0-36.9, adult: Secondary | ICD-10-CM

## 2019-11-13 NOTE — Progress Notes (Signed)
Obstetrics & Gynecology Office Visit   Chief Complaint  Patient presents with  . Contraception    IUD removal  . Consult    weight management   History of Present Illness:  Patientis a 29 y.o. G50P1011 female, who presents for the evaluation of weight gain. She has lost 4 pounds primarily over 2 months using phentermine. She would like to try Saxenda.    She would like her IUD removed.  She would like to try a different form of contraception. However, she would like to wait a month and then come back to try something else. She has had the IUD for 5 years.     Past Medical History:  Diagnosis Date  . Anxiety   . Blood type, Rh positive   . Cervical insufficiency in pregnancy, antepartum    prior pregnancy  . GERD (gastroesophageal reflux disease)   . Obesity   . Polycystic disease, ovaries     Past Surgical History:  Procedure Laterality Date  . CERVICAL CERCLAGE  2016  . CESAREAN SECTION N/A 09/02/2014   Procedure: CESAREAN SECTION;  Surgeon: [name], MD;  Location: [facility];  Service: Obstetrics;  Laterality: N/A;  . CHOLECYSTECTOMY N/A 05/02/2015   Procedure: LAPAROSCOPIC CHOLECYSTECTOMY WITH INTRAOPERATIVE CHOLANGIOGRAM;  Surgeon: Nadeen Landau, MD;  Location: [facility];  Service: General;  Laterality: N/A;  . INTRAUTERINE DEVICE (IUD) INSERTION  10/29/2014   Liletta  . LAPAROSCOPIC GASTRIC SLEEVE RESECTION N/A 04/25/2018   Procedure: LAPAROSCOPIC GASTRIC SLEEVE RESECTION, UPPER ENDO, ERAS,  HIATAL HERNIA REPAIR;  Surgeon: Gaynelle Adu, MD;  Location: [facility];  Service: General;  Laterality: N/A;  . UPPER GI ENDOSCOPY  04/25/2018   Procedure: UPPER GI ENDOSCOPY;  Surgeon: Gaynelle Adu, MD;  Location: [facility];  Service: General;;    Gynecologic History: No LMP recorded. (Menstrual status: IUD).  Obstetric History: G2P1011  Family History  Problem Relation Age of Onset  . Heart disease Father   . Kidney disease Father   . Hypertension Father     Social  History   Socioeconomic History  . Marital status: Married    Spouse name: Not on file  . Number of children: Not on file  . Years of education: Not on file  . Highest education level: Not on file  Occupational History  . Not on file  Tobacco Use  . Smoking status: Never Smoker  . Smokeless tobacco: Never Used  Vaping Use  . Vaping Use: Never used  Substance and Sexual Activity  . Alcohol use: No  . Drug use: No  . Sexual activity: Yes    Birth control/protection: I.U.D.    Comment: Mirena  Other Topics Concern  . Not on file  Social History Narrative  . Not on file   Social Determinants of Health   Financial Resource Strain:   . Difficulty of Paying Living Expenses: Not on file  Food Insecurity:   . Worried About Programme researcher, broadcasting/film/video in the Last Year: Not on file  . Ran Out of Food in the Last Year: Not on file  Transportation Needs:   . Lack of Transportation (Medical): Not on file  . Lack of Transportation (Non-Medical): Not on file  Physical Activity:   . Days of Exercise per Week: Not on file  . Minutes of Exercise per Session: Not on file  Stress:   . Feeling of Stress : Not on file  Social Connections:   . Frequency of Communication with Friends and Family: Not  on file  . Frequency of Social Gatherings with Friends and Family: Not on file  . Attends Religious Services: Not on file  . Active Member of Clubs or Organizations: Not on file  . Attends Banker Meetings: Not on file  . Marital Status: Not on file  Intimate Partner Violence:   . Fear of Current or Ex-Partner: Not on file  . Emotionally Abused: Not on file  . Physically Abused: Not on file  . Sexually Abused: Not on file   Allergies: No Known Allergies  Prior to Admission medications   Medication Sig Start Date End Date Taking? Authorizing Provider  levonorgestrel (MIRENA) 20 MCG/24HR IUD 1 each by Intrauterine route once. Inserted 10/2014   Yes [provider]  sertraline  (ZOLOFT) 50 MG tablet Take by mouth. 05/10/19 05/09/20 Yes [provider]    Review of Systems  Constitutional: Negative.   HENT: Negative.   Eyes: Negative.   Respiratory: Negative.   Cardiovascular: Negative.   Gastrointestinal: Negative.   Genitourinary: Negative.   Musculoskeletal: Negative.   Skin: Negative.   Neurological: Negative.   Psychiatric/Behavioral: Negative.      Physical Exam BP 122/74   Ht 5\' 2"  (1.575 m)   Wt 201 lb (91.2 kg)   BMI 36.76 kg/m  No LMP recorded. (Menstrual status: IUD). Physical Exam Constitutional:      General: She is not in acute distress.    Appearance: Normal appearance.  Genitourinary:     Pelvic exam was performed with patient in the lithotomy position.     Vulva, inguinal canal, vagina, cervix, uterus, right adnexa and left adnexa normal.     IUD strings visualized.  HENT:     Head: Normocephalic and atraumatic.  Eyes:     General: No scleral icterus.    Conjunctiva/sclera: Conjunctivae normal.  Neurological:     General: No focal deficit present.     Mental Status: She is alert and oriented to person, place, and time.     Cranial Nerves: No cranial nerve deficit.  Psychiatric:        Mood and Affect: Mood normal.        Behavior: Behavior normal.        Judgment: Judgment normal.     Female chaperone present for pelvic and breast  portions of the physical exam  Assessment: 29 y.o. G75P1011 female here for  1. Class 2 severe obesity due to excess calories with serious comorbidity and body mass index (BMI) of 36.0 to 36.9 in adult [facility])   2. Pap smear for cervical cancer screening      Plan: Problem List Items Addressed This Visit    None    Visit Diagnoses    Class 2 severe obesity due to excess calories with serious comorbidity and body mass index (BMI) of 36.0 to 36.9 in adult [facility])    -  Primary   Relevant Medications   Liraglutide -Weight Management (SAXENDA) 18 MG/3ML SOPN   Insulin Pen Needle (NOVOFINE  AUTOCOVER PEN NEEDLE) 30G X 8 MM MISC   Pap smear for cervical cancer screening       Relevant Orders   IGP, Aptima HPV, rfx 16/18,45     1) 1500 Calorie ADA Diet  2) Patient education given regarding appropriate lifestyle changes for weight loss including: regular physical activity, healthy coping strategies, caloric restriction and healthy eating patterns.  3) Patient will be started on weight loss medication. The risks and benefits and side effects  of medication, such as Adipex (Phenteramine) ,  Tenuate (Diethylproprion), Belviq (lorcarsin), Contrave (buproprion/naltrexone), Qsymia (phentermine/topiramate), and Saxenda (liraglutide) is discussed. The pros and cons of suppressing appetite and boosting metabolism is discussed. Risks of tolerence and addiction is discussed for selected agents discussed. Use of medicine will ne short term, such as 3-4 months at a time followed by a period of time off of the medicine to avoid these risks and side effects for Adipex, Qsymia, and Tenuate discussed. Pt to call with any negative side effects and agrees to keep follow up appts.  4) Comorbidity Screening - hypothyroidism screening, diabetes, and hyperlipidemia screening offered  5) Encouraged weekly weight monitorig to track progress and sample 1 week food diary  6) Contraception - discussed that all weight loss drugs fall in to pregnancy category X, patient currently has reliable contraception in the form of IUD (Mirena)  A total of 25 minutes were spent face-to-face with the patient as well as preparation, review, communication, and documentation during this encounter.     8) Follow up in 4 weeks to assess response  Thomasene Mohair, MD 11/13/2019 2:36 PM

## 2019-11-17 ENCOUNTER — Encounter: Payer: Self-pay | Admitting: Obstetrics and Gynecology

## 2019-11-17 MED ORDER — SAXENDA 18 MG/3ML ~~LOC~~ SOPN: PEN_INJECTOR | SUBCUTANEOUS | 2 refills | Status: DC

## 2019-11-17 MED ORDER — NOVOFINE AUTOCOVER PEN NEEDLE 30G X 8 MM MISC: 1.0000 | 0 refills | Status: DC | PRN

## 2019-11-17 MED ORDER — SAXENDA 18 MG/3ML ~~LOC~~ SOPN: 3.0000 mg | PEN_INJECTOR | Freq: Every day | SUBCUTANEOUS | 2 refills | Status: AC

## 2019-11-21 ENCOUNTER — Encounter (HOSPITAL_COMMUNITY): Payer: Self-pay

## 2019-11-22 LAB — IGP, APTIMA HPV, RFX 16/18,45
HPV Aptima: POSITIVE — AB
HPV Genotype 16: NEGATIVE
HPV Genotype 18,45: NEGATIVE

## 2020-02-12 ENCOUNTER — Ambulatory Visit (INDEPENDENT_AMBULATORY_CARE_PROVIDER_SITE_OTHER): Payer: Managed Care, Other (non HMO) | Admitting: Obstetrics and Gynecology

## 2020-02-12 ENCOUNTER — Other Ambulatory Visit: Payer: Self-pay

## 2020-02-12 ENCOUNTER — Encounter: Payer: Self-pay | Admitting: Obstetrics and Gynecology

## 2020-02-12 VITALS — Wt 194.0 lb

## 2020-02-12 DIAGNOSIS — E669 Obesity, unspecified: Secondary | ICD-10-CM | POA: Diagnosis not present

## 2020-02-12 DIAGNOSIS — Z6835 Body mass index (BMI) 35.0-35.9, adult: Secondary | ICD-10-CM | POA: Diagnosis not present

## 2020-02-12 MED ORDER — PHENTERMINE HCL 37.5 MG PO TABS
37.5000 mg | ORAL_TABLET | Freq: Every day | ORAL | 0 refills | Status: DC
Start: 2020-02-12 — End: 2020-03-20

## 2020-02-12 NOTE — Progress Notes (Signed)
I connected with Regina Chambers on 02/12/20 at 11:10 AM EST by telephone and verified that I am speaking with the correct person using two identifiers.   I discussed the limitations, risks, security and privacy concerns of performing an evaluation and management service by telephone and the availability of in person appointments. I also discussed with the patient that there may be a patient responsible charge related to this service. The patient expressed understanding and agreed to proceed.  The patient was at home I spoke with the patient from my workstation phone The names of people involved in this encounter were: Regina Chambers , and Regina Chambers   Gynecology Office Visit  Chief Complaint:  Chief Complaint  Patient presents with  . Follow-up    Weight loss, would like to restart Adipex. Phone visit    History of Present Illness: Patientis a 30 y.o. G36P1011 female, who presents for the evaluation of the desire to lose weight. She has lost 7 pounds 2 months. The patient did not start Saxenda secondary to no insurance approval.  She has lost weight unassisted with diet exercise and is seeing a Systems analyst but expresses interest in restarting phentermine.    Review of Systems: 10 point review of systems negative unless otherwise noted in HPI  Past Medical History:  Past Medical History:  Diagnosis Date  . Anxiety   . Blood type, Rh positive   . Cervical insufficiency in pregnancy, antepartum    prior pregnancy  . GERD (gastroesophageal reflux disease)   . Obesity   . Polycystic disease, ovaries     Past Surgical History:  Past Surgical History:  Procedure Laterality Date  . CERVICAL CERCLAGE  2016  . CESAREAN SECTION N/A 09/02/2014   Procedure: CESAREAN SECTION;  Surgeon: Regina Austria, MD;  Location: [facility];  Service: Obstetrics;  Laterality: N/A;  . CHOLECYSTECTOMY N/A 05/02/2015   Procedure: LAPAROSCOPIC CHOLECYSTECTOMY WITH INTRAOPERATIVE  CHOLANGIOGRAM;  Surgeon: Nadeen Landau, MD;  Location: [facility];  Service: General;  Laterality: N/A;  . INTRAUTERINE DEVICE (IUD) INSERTION  10/29/2014   Liletta  . LAPAROSCOPIC GASTRIC SLEEVE RESECTION N/A 04/25/2018   Procedure: LAPAROSCOPIC GASTRIC SLEEVE RESECTION, UPPER ENDO, ERAS,  HIATAL HERNIA REPAIR;  Surgeon: Gaynelle Adu, MD;  Location: [facility];  Service: General;  Laterality: N/A;  . UPPER GI ENDOSCOPY  04/25/2018   Procedure: UPPER GI ENDOSCOPY;  Surgeon: Gaynelle Adu, MD;  Location: [facility];  Service: General;;    Gynecologic History: No LMP recorded. (Menstrual status: IUD).  Obstetric History: G2P1011  Family History:  Family History  Problem Relation Age of Onset  . Heart disease Father   . Kidney disease Father   . Hypertension Father     Social History:  Social History   Socioeconomic History  . Marital status: Married    Spouse name: Not on file  . Number of children: Not on file  . Years of education: Not on file  . Highest education level: Not on file  Occupational History  . Not on file  Tobacco Use  . Smoking status: Never Smoker  . Smokeless tobacco: Never Used  Vaping Use  . Vaping Use: Never used  Substance and Sexual Activity  . Alcohol use: No  . Drug use: No  . Sexual activity: Yes    Birth control/protection: I.U.D.    Comment: Mirena  Other Topics Concern  . Not on file  Social History Narrative  . Not on file   Social  Determinants of Health   Financial Resource Strain: Not on file  Food Insecurity: Not on file  Transportation Needs: Not on file  Physical Activity: Not on file  Stress: Not on file  Social Connections: Not on file  Intimate Partner Violence: Not on file    Allergies:  No Known Allergies  Medications: Prior to Admission medications   Medication Sig Start Date End Date Taking? Authorizing Provider  phentermine (ADIPEX-P) 37.5 MG tablet Take 1 tablet (37.5 mg total) by mouth daily before breakfast.  07/31/19  Yes Regina Austria, MD  sertraline (ZOLOFT) 50 MG tablet Take by mouth. 05/10/19 05/09/20 Yes [provider]  levonorgestrel (MIRENA) 20 MCG/24HR IUD 1 each by Intrauterine route once. Inserted 10/2014    [provider]    Physical Exam Weight 194 lb (88 kg). Wt Readings from Last 3 Encounters:  02/12/20 194 lb (88 kg)  11/13/19 201 lb (91.2 kg)  07/31/19 204 lb (92.5 kg)  Body mass index is 35.48 kg/m.  No physical exam as this was a remote telephone visit to promote social distancing during the current COVID-19 Pandemic  Assessment: 30 y.o. [id number] medical weight loss follow up  Plan: Problem List Items Addressed This Visit   None   Visit Diagnoses    Class 2 obesity without serious comorbidity with body mass index (BMI) of 35.0 to 35.9 in adult, unspecified obesity type    -  Primary   Relevant Medications   phentermine (ADIPEX-P) 37.5 MG tablet      1) 1500 Calorie ADA Diet  2) Patient education given regarding appropriate lifestyle changes for weight loss including: regular physical activity, healthy coping strategies, caloric restriction and healthy eating patterns.  3) Patient to take medication, with the benefits of appetite suppression and metabolism boost d/w pt, along with the side effects and risk factors of long term use that will be avoided with our use of short bursts of therapy. Rx provided. Saxenda    4) Telephone Time 8:22 min  5)  Return in about 4 weeks (around 03/11/2020) for medication follow up phone.    Regina Austria, MD, [facility], [facility] 02/12/2020, 11:47 AM

## 2020-03-06 ENCOUNTER — Other Ambulatory Visit: Payer: Self-pay

## 2020-03-06 ENCOUNTER — Encounter: Payer: Self-pay | Admitting: Obstetrics and Gynecology

## 2020-03-06 ENCOUNTER — Ambulatory Visit (INDEPENDENT_AMBULATORY_CARE_PROVIDER_SITE_OTHER): Payer: Managed Care, Other (non HMO) | Admitting: Obstetrics and Gynecology

## 2020-03-06 VITALS — BP 122/84 | HR 110 | Temp 99.8°F | Resp 16 | Ht 62.0 in | Wt 195.0 lb

## 2020-03-06 DIAGNOSIS — N898 Other specified noninflammatory disorders of vagina: Secondary | ICD-10-CM | POA: Diagnosis not present

## 2020-03-06 DIAGNOSIS — Z113 Encounter for screening for infections with a predominantly sexual mode of transmission: Secondary | ICD-10-CM

## 2020-03-06 DIAGNOSIS — N764 Abscess of vulva: Secondary | ICD-10-CM

## 2020-03-06 LAB — POCT WET PREP WITH KOH
Clue Cells Wet Prep HPF POC: NEGATIVE
KOH Prep POC: NEGATIVE
Trichomonas, UA: NEGATIVE
Yeast Wet Prep HPF POC: NEGATIVE

## 2020-03-06 MED ORDER — FLUCONAZOLE 150 MG PO TABS: 150.0000 mg | ORAL_TABLET | Freq: Once | ORAL | 0 refills | Status: AC

## 2020-03-06 NOTE — Patient Instructions (Signed)
I value your feedback and you entrusting us with your care. If you get a Regina Chambers patient survey, I would appreciate you taking the time to let us know about your experience today. Thank you! ? ? ?

## 2020-03-06 NOTE — Progress Notes (Signed)
Regina Munroe, NP   Chief Complaint  Patient presents with  . Cyst    HPI:      Ms. Regina Chambers is a 30 y.o. G2P1011 whose LMP was No LMP recorded. (Menstrual status: IUD)., presents today for vaginal abscess, starting last wk. Was small lump at first and then got bigger and more painful. Did warm compresses and started draining last night. No fevers, never had before.  Has had increased d/c and irritation as well, like a yeast infection. Pt gets them frequently.  She is sex active and has new partner. Wants STD testing.   Past Medical History:  Diagnosis Date  . Anxiety   . Blood type, Rh positive   . Cervical insufficiency in pregnancy, antepartum    prior pregnancy  . GERD (gastroesophageal reflux disease)   . Obesity   . Polycystic disease, ovaries     Past Surgical History:  Procedure Laterality Date  . CERVICAL CERCLAGE  2016  . CESAREAN SECTION N/A 09/02/2014   Procedure: CESAREAN SECTION;  Surgeon: [name], MD;  Location: [facility];  Service: Obstetrics;  Laterality: N/A;  . CHOLECYSTECTOMY N/A 05/02/2015   Procedure: LAPAROSCOPIC CHOLECYSTECTOMY WITH INTRAOPERATIVE CHOLANGIOGRAM;  Surgeon: Nadeen Landau, MD;  Location: [facility];  Service: General;  Laterality: N/A;  . INTRAUTERINE DEVICE (IUD) INSERTION  10/29/2014   Liletta  . LAPAROSCOPIC GASTRIC SLEEVE RESECTION N/A 04/25/2018   Procedure: LAPAROSCOPIC GASTRIC SLEEVE RESECTION, UPPER ENDO, ERAS,  HIATAL HERNIA REPAIR;  Surgeon: Gaynelle Adu, MD;  Location: [facility];  Service: General;  Laterality: N/A;  . UPPER GI ENDOSCOPY  04/25/2018   Procedure: UPPER GI ENDOSCOPY;  Surgeon: Gaynelle Adu, MD;  Location: [facility];  Service: General;;    Family History  Problem Relation Age of Onset  . Heart disease Father   . Kidney disease Father   . Hypertension Father     Social History   Socioeconomic History  . Marital status: Married    Spouse name: Not on file  . Number of children: Not on  file  . Years of education: Not on file  . Highest education level: Not on file  Occupational History  . Not on file  Tobacco Use  . Smoking status: Never Smoker  . Smokeless tobacco: Never Used  Vaping Use  . Vaping Use: Never used  Substance and Sexual Activity  . Alcohol use: No  . Drug use: No  . Sexual activity: Yes    Birth control/protection: I.U.D.    Comment: Mirena  Other Topics Concern  . Not on file  Social History Narrative  . Not on file   Social Determinants of Health   Financial Resource Strain: Not on file  Food Insecurity: Not on file  Transportation Needs: Not on file  Physical Activity: Not on file  Stress: Not on file  Social Connections: Not on file  Intimate Partner Violence: Not on file    Outpatient Medications Prior to Visit  Medication Sig Dispense Refill  . levonorgestrel (MIRENA) 20 MCG/24HR IUD 1 each by Intrauterine route once. Inserted 10/2014    . phentermine (ADIPEX-P) 37.5 MG tablet Take 1 tablet (37.5 mg total) by mouth daily before breakfast. 30 tablet 0  . sertraline (ZOLOFT) 50 MG tablet Take by mouth.     No facility-administered medications prior to visit.      ROS:  Review of Systems  Constitutional: Negative for fever, malaise/fatigue and weight loss.  Gastrointestinal: Negative for blood in  stool, constipation, diarrhea, nausea and vomiting.  Genitourinary: Positive for genital sores and vaginal discharge. Negative for dyspareunia, dysuria, flank pain, frequency, hematuria, urgency, vaginal bleeding and vaginal pain.  Musculoskeletal: Negative for back pain.  Skin: Negative for itching and rash.   BREAST: No symptoms   OBJECTIVE:   Vitals:  BP 122/84   Pulse (!) 110   Temp 99.8 F (37.7 C)   Resp 16   Ht 5\' 2"  (1.575 m)   Wt 195 lb (88.5 kg)   SpO2 98%   BMI 35.67 kg/m   Physical Exam Vitals reviewed.  Constitutional:      Appearance: She is well-developed.  Pulmonary:     Effort: Pulmonary effort is  normal.  Genitourinary:    Pubic Area: No rash.      Labia:        Right: No rash, tenderness or lesion.        Left: Tenderness and lesion present. No rash.      Vagina: Normal. No vaginal discharge, erythema or tenderness.     Cervix: Normal.     Uterus: Normal. Not enlarged and not tender.      Adnexa: Right adnexa normal and left adnexa normal.       Right: No mass or tenderness.         Left: No mass or tenderness.      Musculoskeletal:        General: Normal range of motion.     Cervical back: Normal range of motion.  Skin:    General: Skin is warm and dry.  Neurological:     General: No focal deficit present.     Mental Status: She is alert and oriented to person, place, and time.  Psychiatric:        Mood and Affect: Mood normal.        Behavior: Behavior normal.        Thought Content: Thought content normal.        Judgment: Judgment normal.     Results: Results for orders placed or performed in visit on 03/06/20 (from the past 24 hour(s))  POCT Wet Prep with KOH     Status: Normal   Collection Time: 03/06/20  4:30 PM  Result Value Ref Range   Trichomonas, UA Negative    Clue Cells Wet Prep HPF POC neg    Epithelial Wet Prep HPF POC     Yeast Wet Prep HPF POC neg    Bacteria Wet Prep HPF POC     RBC Wet Prep HPF POC     WBC Wet Prep HPF POC     KOH Prep POC Negative Negative     Assessment/Plan: Vulvar abscess--resolving. Cont warm compresses, no need for abx. F/u prn.   Vaginal itching - Plan: fluconazole (DIFLUCAN) 150 MG tablet, POCT Wet Prep with KOH; pos sx and hx, neg wet prep. Rx diflucan. F/u prn.  Screening for STD (sexually transmitted disease) - Plan: Chlamydia/Gonococcus/Trichomonas, NAA    Meds ordered this encounter  Medications  . fluconazole (DIFLUCAN) 150 MG tablet    Sig: Take 1 tablet (150 mg total) by mouth once for 1 dose.    Dispense:  1 tablet    Refill:  0    Order Specific Question:   Supervising Provider    Answer:    03/08/20 Nadara Mustard      Return if symptoms worsen or fail to improve.  Chasten Blaze B. Hezakiah Champeau, PA-C 03/06/2020 4:31 PM

## 2020-03-08 LAB — CHLAMYDIA/GONOCOCCUS/TRICHOMONAS, NAA
Chlamydia by NAA: NEGATIVE
Gonococcus by NAA: NEGATIVE
Trich vag by NAA: NEGATIVE

## 2020-03-20 ENCOUNTER — Telehealth: Payer: Self-pay

## 2020-03-20 ENCOUNTER — Encounter: Payer: Self-pay | Admitting: Obstetrics and Gynecology

## 2020-03-20 ENCOUNTER — Ambulatory Visit (INDEPENDENT_AMBULATORY_CARE_PROVIDER_SITE_OTHER): Payer: Managed Care, Other (non HMO) | Admitting: Obstetrics and Gynecology

## 2020-03-20 VITALS — Ht 62.0 in | Wt 190.0 lb

## 2020-03-20 DIAGNOSIS — E669 Obesity, unspecified: Secondary | ICD-10-CM | POA: Diagnosis not present

## 2020-03-20 DIAGNOSIS — Z6834 Body mass index (BMI) 34.0-34.9, adult: Secondary | ICD-10-CM

## 2020-03-20 MED ORDER — PHENTERMINE HCL 37.5 MG PO TABS
37.5000 mg | ORAL_TABLET | Freq: Every day | ORAL | 0 refills | Status: DC
Start: 2020-03-20 — End: 2021-01-27

## 2020-03-20 NOTE — Telephone Encounter (Signed)
Spoke w/patient. Advised per last visit note needs a f/u phone apt. Scheduled for 4:30 today.

## 2020-03-20 NOTE — Telephone Encounter (Signed)
Patient weighed this morning 190#.She is down another 5 lbs. Needs another refill. Inquiring if needs a phone apt. Cb#[phone]

## 2020-03-20 NOTE — Progress Notes (Signed)
I connected with Regina Chambers on 03/20/20 at  4:30 PM EST by telephone and verified that I am speaking with the correct person using two identifiers.   I discussed the limitations, risks, security and privacy concerns of performing an evaluation and management service by telephone and the availability of in person appointments. I also discussed with the patient that there may be a patient responsible charge related to this service. The patient expressed understanding and agreed to proceed.  The patient was at home I spoke with the patient from my workstation phone The names of people involved in this encounter were: Regina Chambers , and [name]   Gynecology Office Visit  Chief Complaint:  Chief Complaint  Patient presents with  . Follow-up    Phone visit F/U medication     History of Present Illness: Patientis a 30 y.o. G22P1011 female, who presents for the evaluation of the desire to lose weight. She has lost 5 pounds 1 months. The patient states the following symptoms since starting her weight loss therapy: appetite suppression, energy, and weight loss.  The patient also reports no other ill effects. The patient specifically denies heart palpitations, anxiety, and insomnia.    Review of Systems: 10 point review of systems negative unless otherwise noted in HPI  Past Medical History:  Past Medical History:  Diagnosis Date  . Anxiety   . Blood type, Rh positive   . Cervical insufficiency in pregnancy, antepartum    prior pregnancy  . GERD (gastroesophageal reflux disease)   . Obesity   . Polycystic disease, ovaries     Past Surgical History:  Past Surgical History:  Procedure Laterality Date  . CERVICAL CERCLAGE  2016  . CESAREAN SECTION N/A 09/02/2014   Procedure: CESAREAN SECTION;  Surgeon: [name], MD;  Location: [facility];  Service: Obstetrics;  Laterality: N/A;  . CHOLECYSTECTOMY N/A 05/02/2015   Procedure: LAPAROSCOPIC CHOLECYSTECTOMY  WITH INTRAOPERATIVE CHOLANGIOGRAM;  Surgeon: Nadeen Landau, MD;  Location: [facility];  Service: General;  Laterality: N/A;  . INTRAUTERINE DEVICE (IUD) INSERTION  10/29/2014   Liletta  . LAPAROSCOPIC GASTRIC SLEEVE RESECTION N/A 04/25/2018   Procedure: LAPAROSCOPIC GASTRIC SLEEVE RESECTION, UPPER ENDO, ERAS,  HIATAL HERNIA REPAIR;  Surgeon: Gaynelle Adu, MD;  Location: [facility];  Service: General;  Laterality: N/A;  . UPPER GI ENDOSCOPY  04/25/2018   Procedure: UPPER GI ENDOSCOPY;  Surgeon: Gaynelle Adu, MD;  Location: [facility];  Service: General;;    Gynecologic History: No LMP recorded. (Menstrual status: IUD).  Obstetric History: G2P1011  Family History:  Family History  Problem Relation Age of Onset  . Heart disease Father   . Kidney disease Father   . Hypertension Father     Social History:  Social History   Socioeconomic History  . Marital status: Married    Spouse name: Not on file  . Number of children: Not on file  . Years of education: Not on file  . Highest education level: Not on file  Occupational History  . Not on file  Tobacco Use  . Smoking status: Never Smoker  . Smokeless tobacco: Never Used  Vaping Use  . Vaping Use: Never used  Substance and Sexual Activity  . Alcohol use: No  . Drug use: No  . Sexual activity: Yes    Birth control/protection: I.U.D.    Comment: Mirena  Other Topics Concern  . Not on file  Social History Narrative  . Not on file  Social Determinants of Health   Financial Resource Strain: Not on file  Food Insecurity: Not on file  Transportation Needs: Not on file  Physical Activity: Not on file  Stress: Not on file  Social Connections: Not on file  Intimate Partner Violence: Not on file    Allergies:  No Known Allergies  Medications: Prior to Admission medications   Medication Sig Start Date End Date Taking? Authorizing Provider  phentermine (ADIPEX-P) 37.5 MG tablet Take 1 tablet (37.5 mg total) by mouth daily  before breakfast. 02/12/20  Yes [name], MD  sertraline (ZOLOFT) 50 MG tablet Take by mouth. 05/10/19 05/09/20 Yes [provider]  levonorgestrel (MIRENA) 20 MCG/24HR IUD 1 each by Intrauterine route once. Inserted 10/2014    [provider]    Physical Exam Height 5\' 2"  (1.575 m), weight 190 lb (86.2 kg). Wt Readings from Last 3 Encounters:  03/20/20 190 lb (86.2 kg)  03/06/20 195 lb (88.5 kg)  02/12/20 194 lb (88 kg)  Body mass index is 34.75 kg/m.  No physical exam as this was a remote telephone visit to promote social distancing during the current COVID-19 Pandemic  Assessment: 30 y.o. 38 follow Plan: Problem List Items Addressed This Visit   None   Visit Diagnoses    Class 1 obesity without serious comorbidity with body mass index (BMI) of 34.0 to 34.9 in adult, unspecified obesity type    -  Primary   Relevant Medications   phentermine (ADIPEX-P) 37.5 MG tablet      1) 1500 Calorie ADA Diet  2) Patient education given regarding appropriate lifestyle changes for weight loss including: regular physical activity, healthy coping strategies, caloric restriction and healthy eating patterns.  3) Patient to take medication, with the benefits of appetite suppression and metabolism boost d/w pt, along with the side effects and risk factors of long term use that will be avoided with our use of short bursts of therapy. Rx provided.    4) Telephone Time 5:5min  5 ) Return in about 4 weeks (around 04/17/2020) for medication follow up.   06/17/2020, MD, [name] [facility], [facility] 03/20/2020, 3:24 PM

## 2020-03-26 IMAGING — RF DG UGI W/ KUB
10 series · 10 of 10 positions shown · non-contrast
Comparison: None.

CLINICAL DATA: Preoperative evaluation prior to gastric sleeve
procedure. No abdominal complaints. History of previous
cholecystectomy.

EXAM:
UPPER GI SERIES WITH KUB
TECHNIQUE: After obtaining a scout radiograph a routine upper GI series was
performed using thin and high density barium. Effervescent crystals
and a barium tablet were administered.
FLUOROSCOPY TIME:  Fluoroscopy Time:  0 minutes, 54 seconds
Radiation Exposure Index (if provided by the fluoroscopic device):
44 mGy
Number of Acquired Spot Images: 8

[Series 1: t abdomen supine · 0.15mm/px · 1 of 1 slices shown (1 of 2)]
[im 1/1]
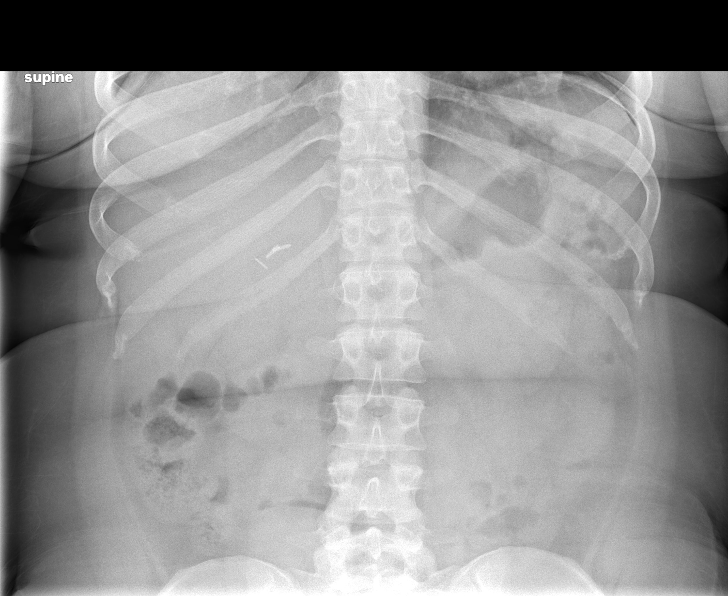

[Series 2: t abdomen supine · 0.15mm/px · 1 of 1 slices shown (2 of 2)]
[im 1/1]
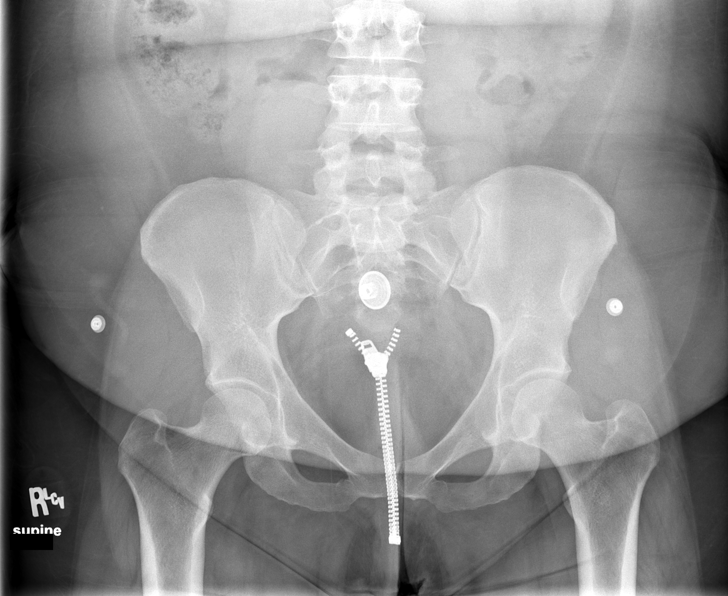

[Series 3: fluoro_barium 2fps_bw · 0.18mm/px · 1 of 1 slices shown (1 of 8)]
[im 1/1]
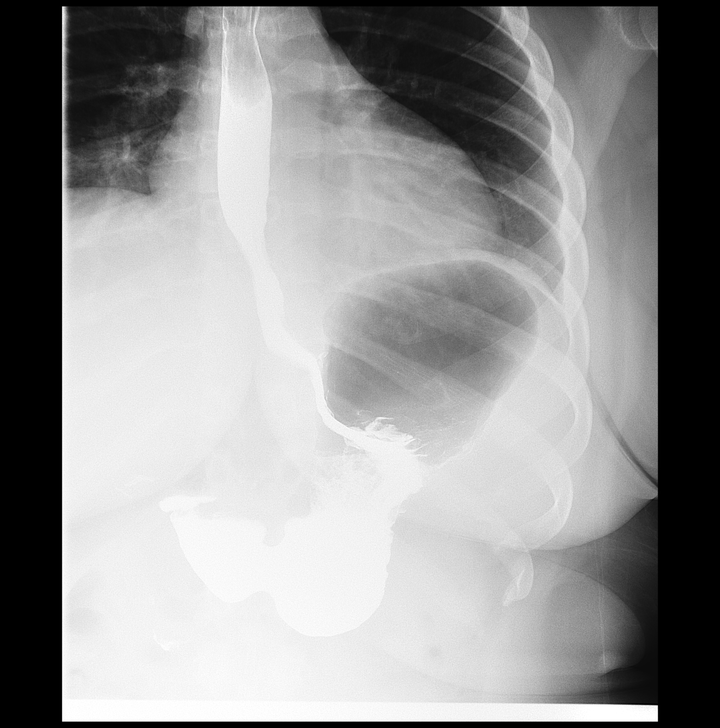

[Series 4: fluoro_barium 2fps_bw · 0.19mm/px · 1 of 1 slices shown (2 of 8)]
[im 1/1]
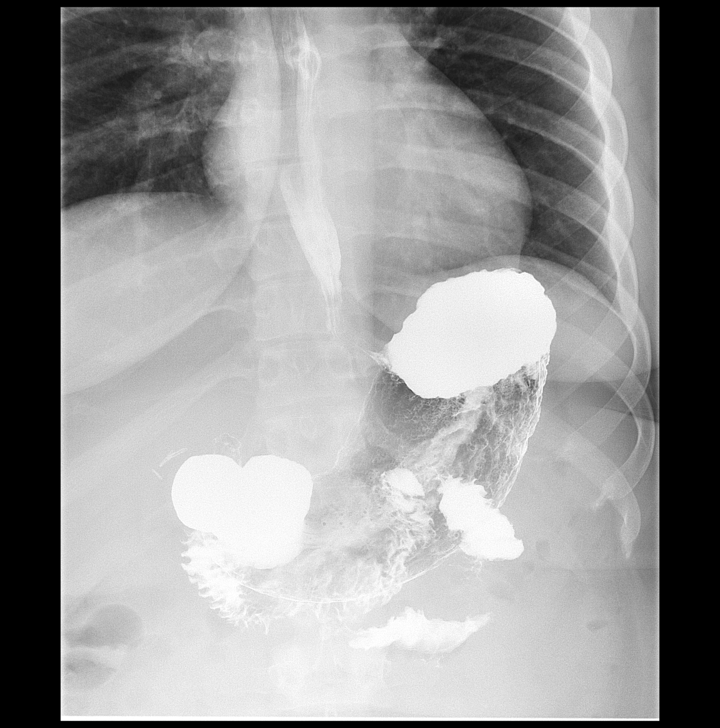

[Series 5: fluoro_barium 2fps_bw · 0.18mm/px · 1 of 1 slices shown (3 of 8)]
[im 1/1]
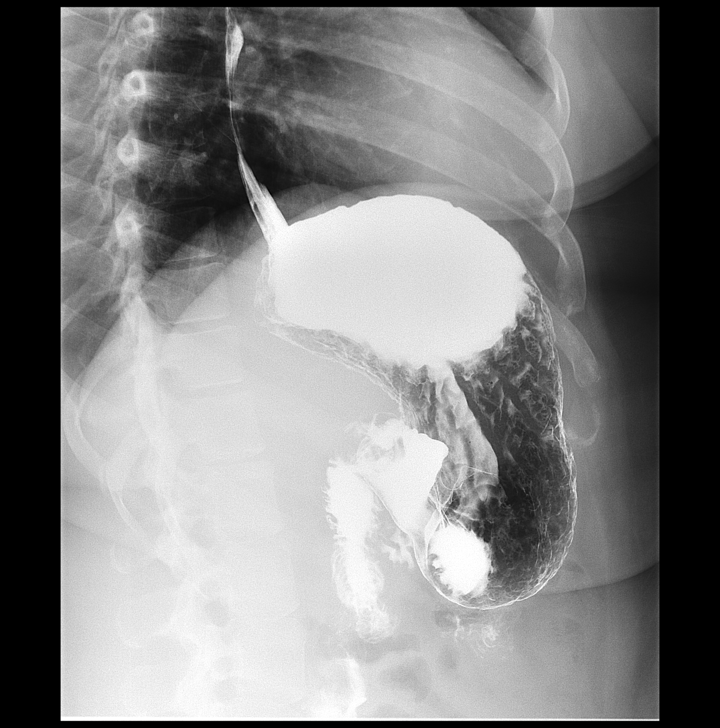

[Series 6: fluoro_barium 2fps_bw · 0.19mm/px · 1 of 1 slices shown (4 of 8)]
[im 1/1]
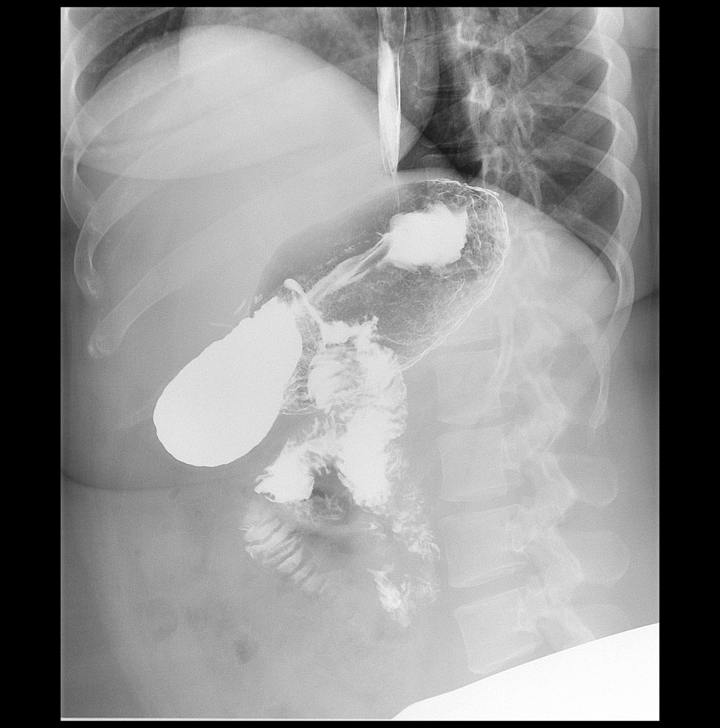

[Series 7: fluoro_barium 2fps_bw · 0.19mm/px · 1 of 1 slices shown (5 of 8)]
[im 1/1]
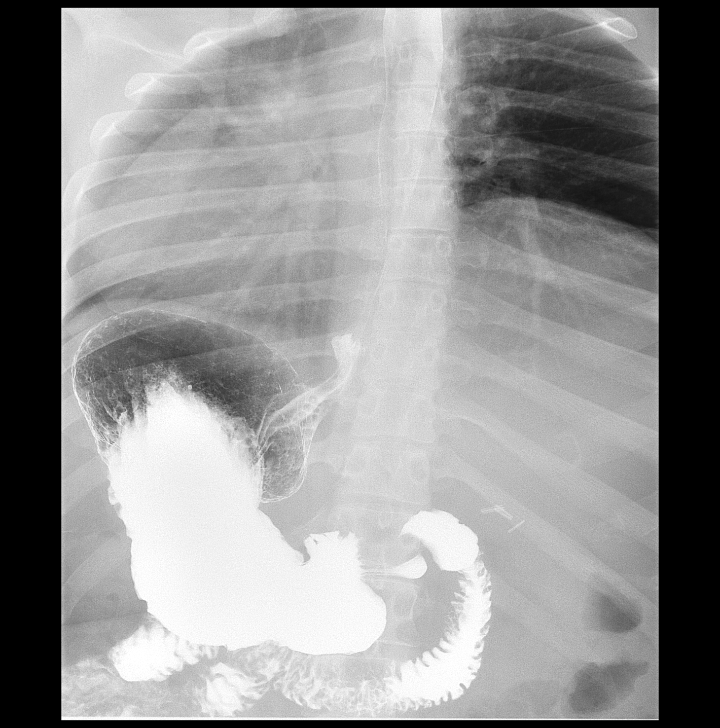

[Series 8: fluoro_barium 2fps_bw · 0.19mm/px · 1 of 1 slices shown (6 of 8)]
[im 1/1]
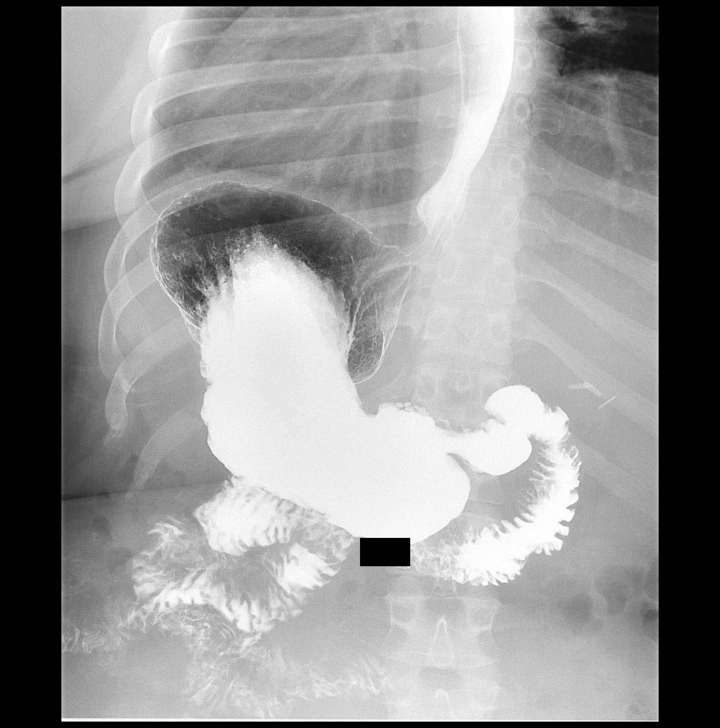

[Series 9: fluoro_barium 2fps_bw · 0.19mm/px · 1 of 1 slices shown (7 of 8)]
[im 1/1]
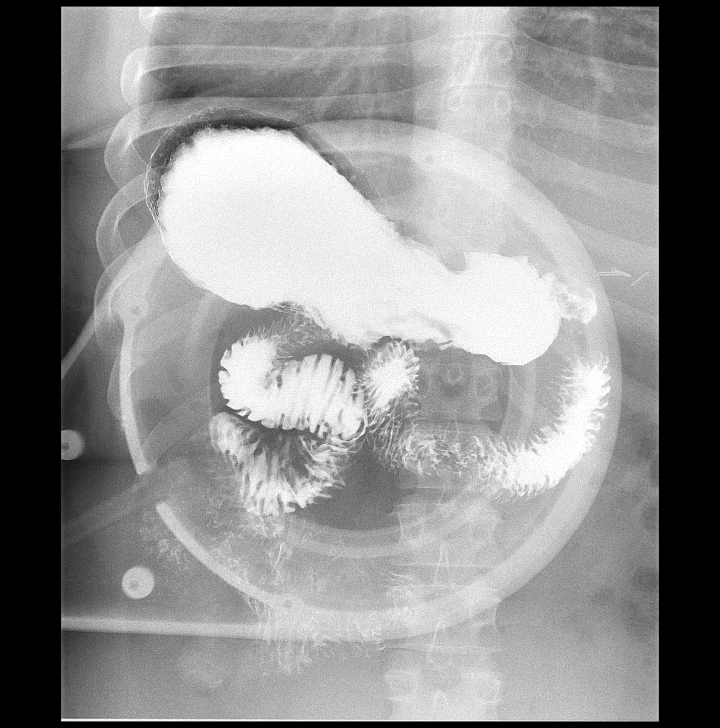

[Series 10: fluoro_barium 2fps_bw · 0.19mm/px · 1 of 1 slices shown (8 of 8)]
[im 1/1]
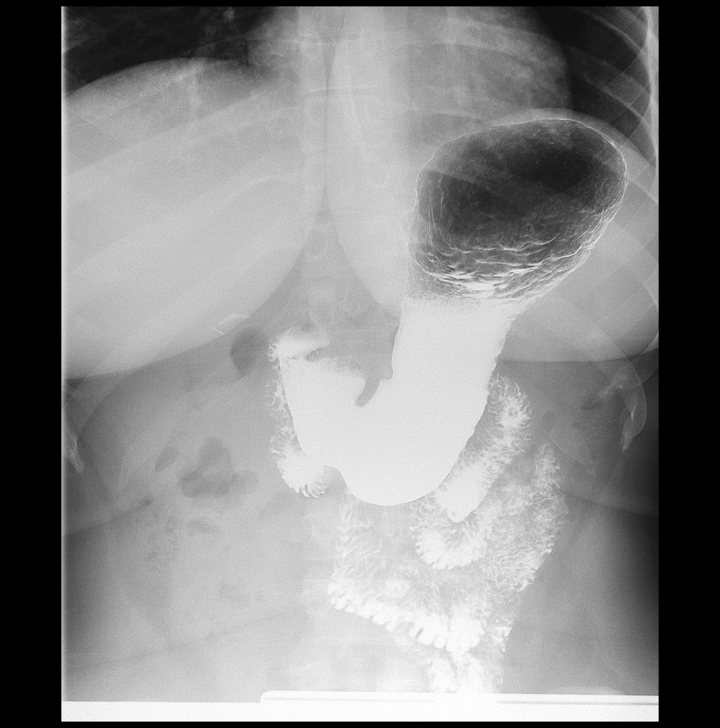

[10 of 10 positions shown; findings below may reference images not displayed]

FINDINGS: The scout radiograph reveals a normal bowel gas pattern. There are
surgical clips in the gallbladder fossa. The patient ingested thick
and thin barium and the gas-forming crystals without difficulty. The
thoracic esophagus was normal in a survey fashion. There was a
moderate amount of spontaneous gastroesophageal reflux. A small
reducible hiatal hernia was present. The stomach distended well. The
mucosal fold pattern was normal. The gastric wall was pliable.
Gastric emptying was prompt. The duodenal bulb and C sweep were
normal in appearance and position. The barium tablet passed promptly
from the mouth to the stomach.
IMPRESSION: Small amount of spontaneous gastroesophageal reflux. Small reducible
hiatal hernia. Otherwise normal upper GI series.

## 2020-03-26 IMAGING — CR DG CHEST 2V
1 series · 2 of 2 positions shown · non-contrast
Comparison: 11/14/2014

CLINICAL DATA: Preoperative evaluation for gastric sleeve

EXAM:
CHEST - 2 VIEW

[Series 1: dg chest 2 view · 0.14mm/px · 2 of 2 slices shown]
[im 1/2]
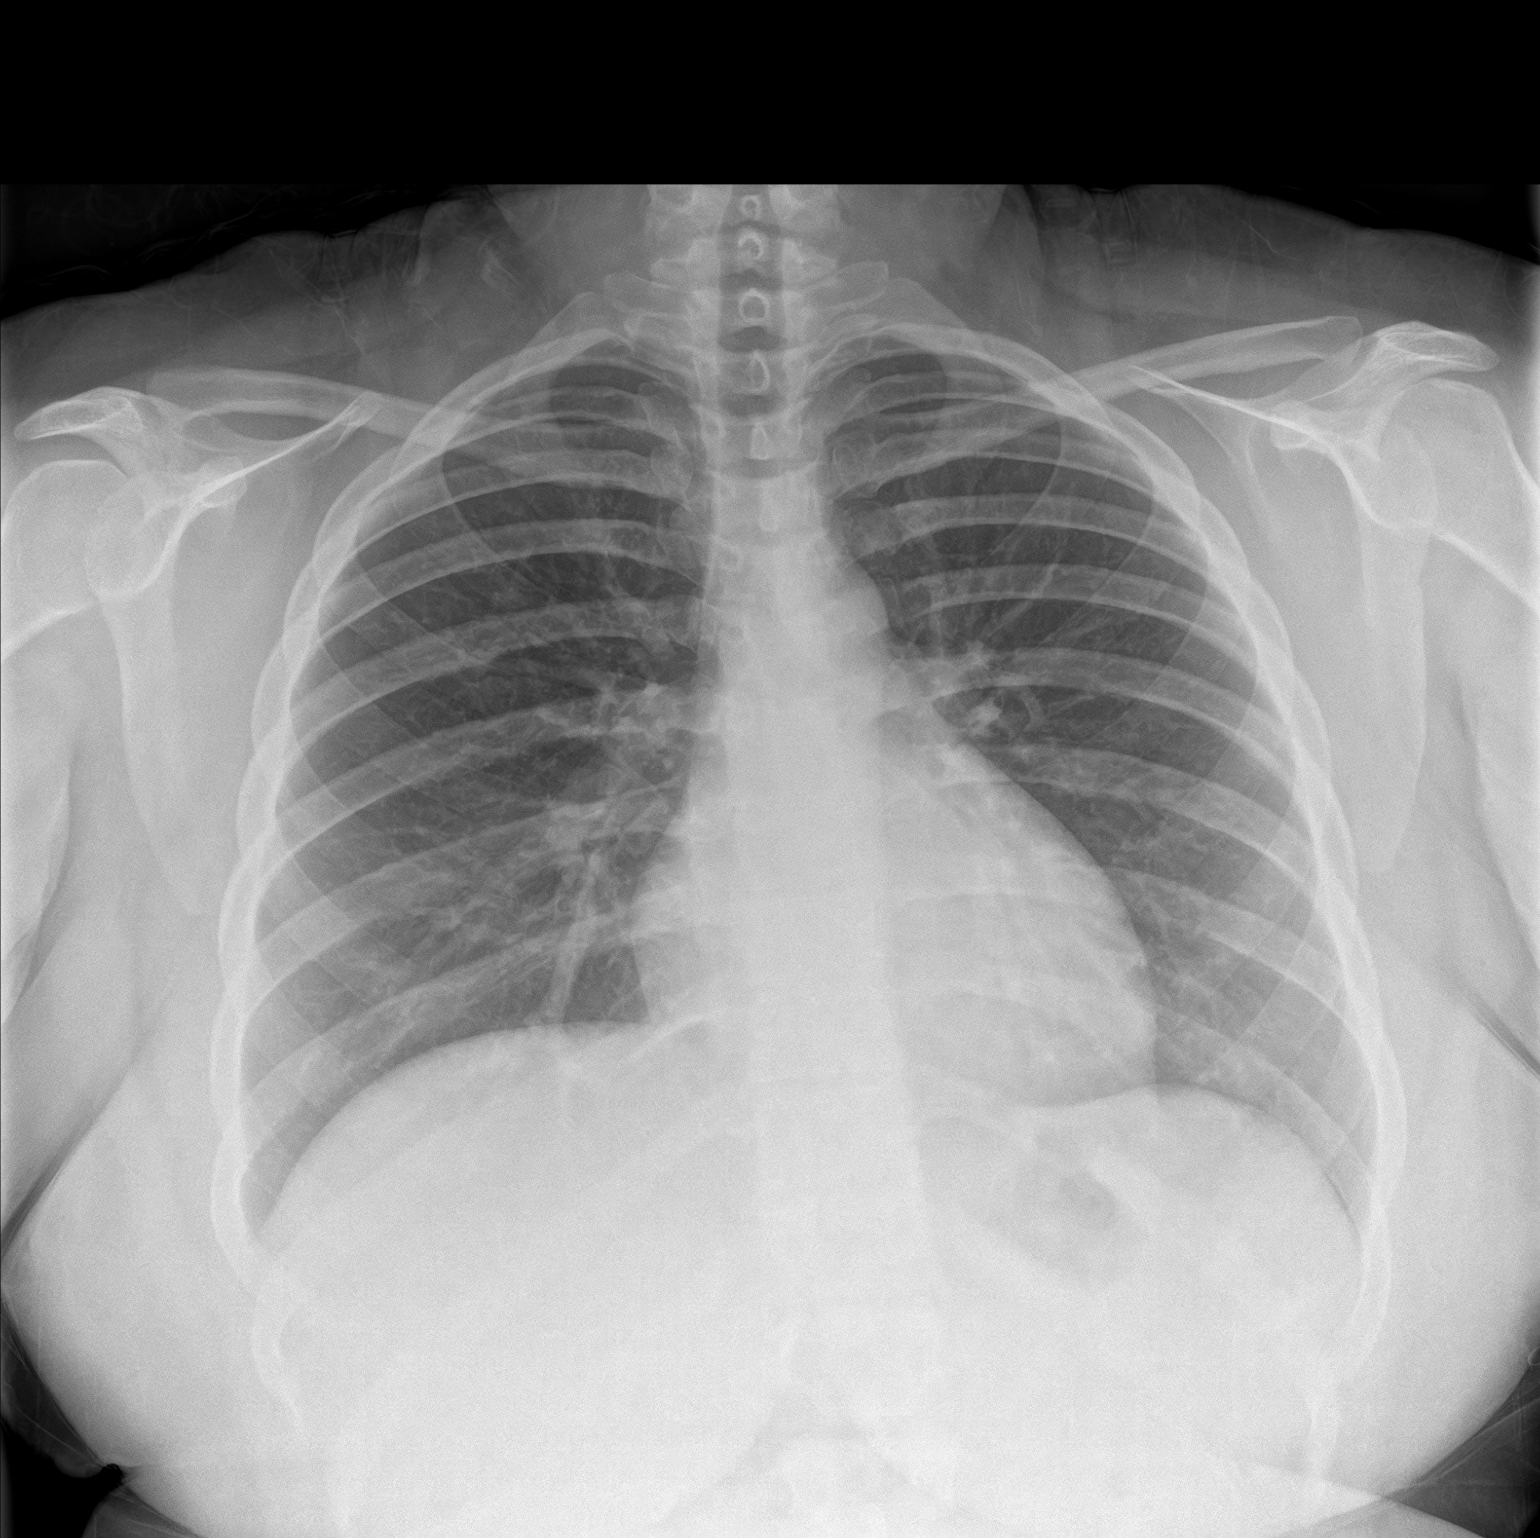
[im 2/2]
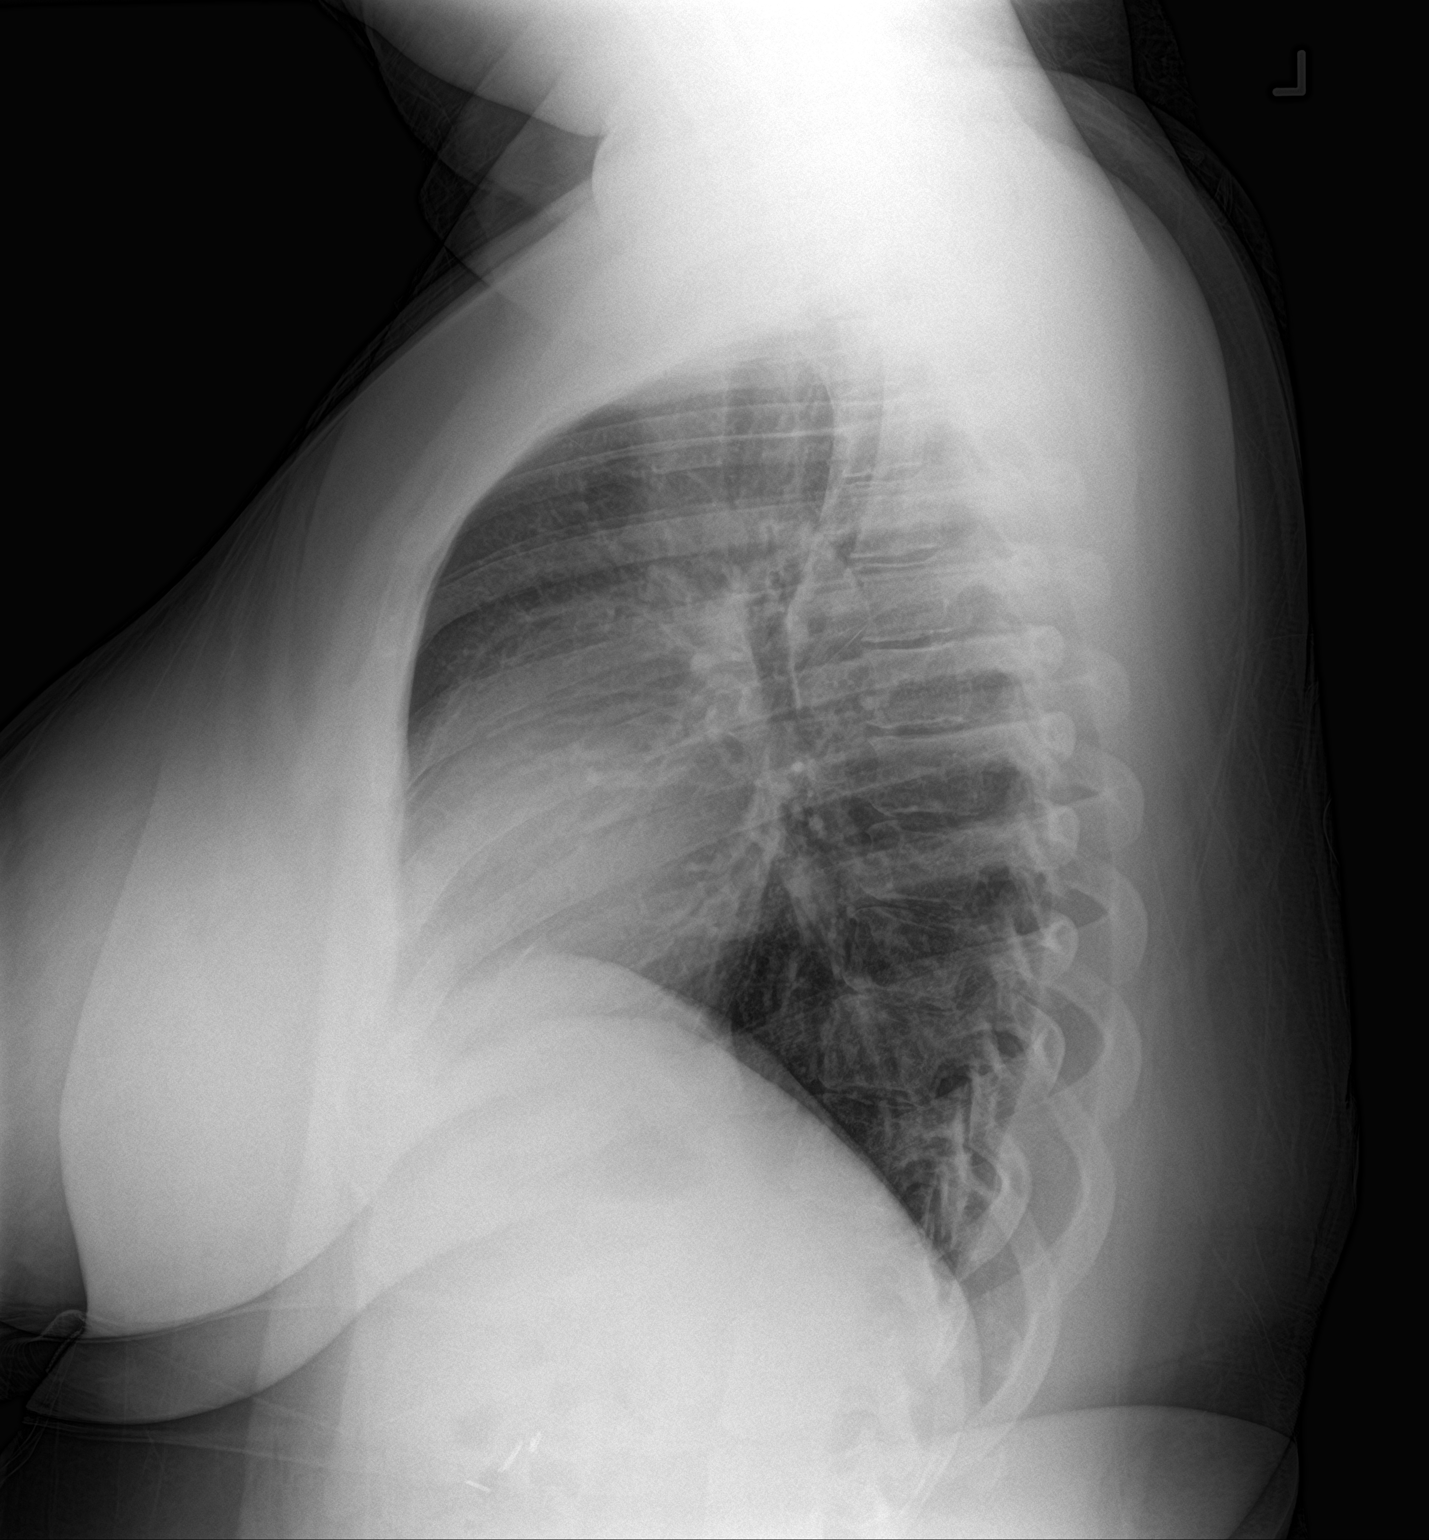

[2 of 2 positions shown; findings below may reference images not displayed]

FINDINGS: The heart size and mediastinal contours are within normal limits.
Both lungs are clear. The visualized skeletal structures are
unremarkable.

Normal heart size and vascularity. Trachea is midline. No acute
osseous finding. Remote cholecystectomy.
IMPRESSION: No active cardiopulmonary disease.

## 2020-04-08 ENCOUNTER — Telehealth: Payer: Self-pay

## 2020-04-08 NOTE — Telephone Encounter (Signed)
Pt calling; is prone to yeast inf; has one now; AMS said to call when she had one and he would send in rx.  They seem to be monthly; can she have an order so she doesn't have to keep calling in?  [phone]

## 2020-04-09 ENCOUNTER — Other Ambulatory Visit: Payer: Self-pay | Admitting: Obstetrics and Gynecology

## 2020-04-09 MED ORDER — FLUCONAZOLE 150 MG PO TABS: 150.0000 mg | ORAL_TABLET | Freq: Once | ORAL | 0 refills | Status: AC

## 2020-04-09 NOTE — Telephone Encounter (Signed)
Rx has been sent  

## 2020-04-10 NOTE — Telephone Encounter (Signed)
Left detailed msg that rx has been sent in. 

## 2020-06-27 ENCOUNTER — Other Ambulatory Visit: Payer: Self-pay | Admitting: Obstetrics and Gynecology

## 2020-06-27 ENCOUNTER — Telehealth: Payer: Self-pay

## 2020-06-27 MED ORDER — FLUCONAZOLE 150 MG PO TABS: 150.0000 mg | ORAL_TABLET | Freq: Once | ORAL | 0 refills | Status: AC

## 2020-06-27 NOTE — Telephone Encounter (Signed)
Rx diflucan called in

## 2020-06-27 NOTE — Telephone Encounter (Signed)
Mailbox full. Will send my chart message to notify of rx sent.

## 2020-06-27 NOTE — Progress Notes (Signed)
Vaginal candida symptoms rx diflucan

## 2020-06-27 NOTE — Telephone Encounter (Signed)
Spoke w/patient. She is having a thick white d/c w/itching and discomfort for the past 3-4 days. She typically gets about 1x month.

## 2020-06-27 NOTE — Telephone Encounter (Signed)
Patient requesting rx for Diflucan. Cb#[phone]

## 2020-07-08 ENCOUNTER — Other Ambulatory Visit: Payer: Self-pay

## 2020-07-08 ENCOUNTER — Encounter: Payer: Self-pay | Admitting: Obstetrics

## 2020-07-08 ENCOUNTER — Ambulatory Visit (INDEPENDENT_AMBULATORY_CARE_PROVIDER_SITE_OTHER): Payer: Managed Care, Other (non HMO) | Admitting: Obstetrics

## 2020-07-08 VITALS — BP 120/70 | Ht 62.0 in | Wt 193.4 lb

## 2020-07-08 DIAGNOSIS — Z113 Encounter for screening for infections with a predominantly sexual mode of transmission: Secondary | ICD-10-CM

## 2020-07-08 DIAGNOSIS — N898 Other specified noninflammatory disorders of vagina: Secondary | ICD-10-CM

## 2020-07-08 LAB — POCT WET PREP (WET MOUNT): Trichomonas Wet Prep HPF POC: ABSENT

## 2020-07-08 NOTE — Progress Notes (Signed)
Obstetrics & Gynecology Office Visit   Chief Complaint:  Chief Complaint  Patient presents with  . Gynecologic Exam    History of Present Illness: Regina Chambers presents for evaluation of some suprapubic pain and shares her concern that it is caused by her IUD.She had a second IUD placed 2 years ago, and c/o ongoing yeast infections since having the IUD. She appreciates not having a period with the IUD, as well as its effectiveness. Her OB hx is noted for a Cesarean section. She also c/o a vaginal discharge, and some occasional pain with intercourse. She asks for an IUD check and STI screening. She denies any urinary complaints today.She routinely uses boric acid capsules for recurrent yeast. She denies itching or irritation.  Review of Systems:  Review of Systems  Constitutional: Negative.   HENT: Negative.   Eyes: Negative.   Respiratory: Negative.   Cardiovascular: Negative.   Gastrointestinal: Negative.   Genitourinary: Negative.   Musculoskeletal: Negative.   Skin: Negative.   Neurological: Negative.   Endo/Heme/Allergies: Negative.   Psychiatric/Behavioral: Negative.      Past Medical History:  Past Medical History:  Diagnosis Date  . Anxiety   . Blood type, Rh positive   . Cervical insufficiency in pregnancy, antepartum    prior pregnancy  . GERD (gastroesophageal reflux disease)   . Obesity   . Polycystic disease, ovaries     Past Surgical History:  Past Surgical History:  Procedure Laterality Date  . CERVICAL CERCLAGE  2016  . CESAREAN SECTION N/A 09/02/2014   Procedure: CESAREAN SECTION;  Surgeon: [name], MD;  Location: [facility];  Service: Obstetrics;  Laterality: N/A;  . CHOLECYSTECTOMY N/A 05/02/2015   Procedure: LAPAROSCOPIC CHOLECYSTECTOMY WITH INTRAOPERATIVE CHOLANGIOGRAM;  Surgeon: Nadeen Landau, MD;  Location: [facility];  Service: General;  Laterality: N/A;  . INTRAUTERINE DEVICE (IUD) INSERTION  10/29/2014   Liletta  . LAPAROSCOPIC GASTRIC  SLEEVE RESECTION N/A 04/25/2018   Procedure: LAPAROSCOPIC GASTRIC SLEEVE RESECTION, UPPER ENDO, ERAS,  HIATAL HERNIA REPAIR;  Surgeon: Gaynelle Adu, MD;  Location: [facility];  Service: General;  Laterality: N/A;  . UPPER GI ENDOSCOPY  04/25/2018   Procedure: UPPER GI ENDOSCOPY;  Surgeon: Gaynelle Adu, MD;  Location: [facility];  Service: General;;    Gynecologic History: No LMP recorded. (Menstrual status: IUD).  Obstetric History: G2P1011  Family History:  Family History  Problem Relation Age of Onset  . Heart disease Father   . Kidney disease Father   . Hypertension Father     Social History:  Social History   Socioeconomic History  . Marital status: Married    Spouse name: Not on file  . Number of children: Not on file  . Years of education: Not on file  . Highest education level: Not on file  Occupational History  . Not on file  Tobacco Use  . Smoking status: Never Smoker  . Smokeless tobacco: Never Used  Vaping Use  . Vaping Use: Never used  Substance and Sexual Activity  . Alcohol use: No  . Drug use: No  . Sexual activity: Yes    Birth control/protection: I.U.D.    Comment: Mirena  Other Topics Concern  . Not on file  Social History Narrative  . Not on file   Social Determinants of Health   Financial Resource Strain: Not on file  Food Insecurity: Not on file  Transportation Needs: Not on file  Physical Activity: Not on file  Stress: Not on file  Social  Connections: Not on file  Intimate Partner Violence: Not on file    Allergies:  No Known Allergies  Medications: Prior to Admission medications   Medication Sig Start Date End Date Taking? Authorizing Provider  levonorgestrel (MIRENA) 20 MCG/24HR IUD 1 each by Intrauterine route once. Inserted 10/2014   Yes [provider]  phentermine (ADIPEX-P) 37.5 MG tablet Take 1 tablet (37.5 mg total) by mouth daily before breakfast. Patient not taking: Reported on 07/08/2020 03/20/20   [name], MD     Physical Exam Vitals:  Vitals:   07/08/20 1032  BP: 120/70   No LMP recorded. (Menstrual status: IUD).  Physical Exam Vitals reviewed.  Constitutional:      Appearance: Normal appearance. She is obese.  HENT:     Head: Normocephalic and atraumatic.     Nose: Nose normal.  Cardiovascular:     Rate and Rhythm: Normal rate and regular rhythm.  Pulmonary:     Effort: Pulmonary effort is normal.     Breath sounds: Normal breath sounds.  Genitourinary:    General: Normal vulva.     Comments: Normal exteranl genitalia. No rashes or lesions, Uterus is anteverted, mobile, non enlarged. Spec exam reveals IUD strings protruding from the cervical os. Modearte amount of white , creamy , non malodorous discharge. Bimanual exam reveals some tenderness under the CS scar line . No adnexal masses or tenderness. No CMT. Musculoskeletal:        General: Normal range of motion.     Cervical back: Normal range of motion and neck supple.  Skin:    General: Skin is warm and dry.  Neurological:     General: No focal deficit present.     Mental Status: She is alert and oriented to person, place, and time.  Psychiatric:        Mood and Affect: Mood normal.        Behavior: Behavior normal.    Wet mount: no yeast seen, no trich noted. Few clue cells senn, fewer healthy flora noted, negative whiff test.  Assessment: 30 y.o. G2P1011 vaginal discharge and occasional suprapubic pain.  Plan: Problem List Items Addressed This Visit   None   Visit Diagnoses    Vaginal discharge    -  Primary   Relevant Orders   Bacterial Vaginosis, NAA   Routine screening for STI (sexually transmitted infection)       Relevant Orders   GC/Chlamydia Probe Amp     Will review her Aptima swab results. Reassured that her IUD is in place. Offered option of peovic scan to check IUD location. Suspect she may have some adhesions from her CS that cause intermittant discomfort.  RTC PRN, and we will treat her based  on the test results.  Mirna Mires, CNM  07/08/2020 6:18 PM

## 2020-07-10 LAB — GC/CHLAMYDIA PROBE AMP
Chlamydia trachomatis, NAA: NEGATIVE
Neisseria Gonorrhoeae by PCR: NEGATIVE

## 2020-07-14 ENCOUNTER — Encounter: Payer: Self-pay | Admitting: Obstetrics

## 2020-07-14 LAB — BACTERIAL VAGINOSIS, NAA

## 2020-11-20 ENCOUNTER — Encounter (HOSPITAL_COMMUNITY): Payer: Self-pay | Admitting: *Deleted

## 2020-12-29 ENCOUNTER — Encounter: Payer: Self-pay | Admitting: Obstetrics

## 2020-12-29 ENCOUNTER — Ambulatory Visit (INDEPENDENT_AMBULATORY_CARE_PROVIDER_SITE_OTHER): Payer: Managed Care, Other (non HMO) | Admitting: Obstetrics

## 2020-12-29 ENCOUNTER — Other Ambulatory Visit: Payer: Self-pay

## 2020-12-29 VITALS — BP 120/80 | Ht 62.0 in | Wt 193.0 lb

## 2020-12-29 DIAGNOSIS — Z01419 Encounter for gynecological examination (general) (routine) without abnormal findings: Secondary | ICD-10-CM

## 2020-12-29 DIAGNOSIS — Z113 Encounter for screening for infections with a predominantly sexual mode of transmission: Secondary | ICD-10-CM

## 2020-12-29 DIAGNOSIS — Z124 Encounter for screening for malignant neoplasm of cervix: Secondary | ICD-10-CM

## 2020-12-29 NOTE — Progress Notes (Signed)
Gynecology Annual Exam   PCP: Lorre Munroe, NP  Chief Complaint:  Chief Complaint  Patient presents with   Gynecologic Exam    Weight concerns    History of Present Illness: Patient is a 30 y.o. G2P1011 presents for annual exam. The patient has is interested in discussing medical management of weight loss with Phentermine LMP: No LMP recorded. (Menstrual status: IUD). She does not have periods due to her IUD in place. The patient is sexually active. She currently uses IUD for contraception. She denies dyspareunia.  The patient does perform self breast exams.  There is no notable family history of breast or ovarian cancer in her family.  The patient wears seatbelts: yes.   The patient has regular exercise: no.    The patient denies current symptoms of depression.    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   Obesity (BMI 30-39.9) 08/18/2017   PCOS (polycystic ovarian syndrome) 04/10/2015    Past Surgical History:  Past Surgical History:  Procedure Laterality Date   CERVICAL CERCLAGE  2016   CESAREAN SECTION N/A 09/02/2014   Procedure: CESAREAN SECTION;  Surgeon: [name], MD;  Location: [facility] ORS;  Service: Obstetrics;  Laterality: N/A;   CHOLECYSTECTOMY N/A 05/02/2015   Procedure: LAPAROSCOPIC CHOLECYSTECTOMY WITH INTRAOPERATIVE CHOLANGIOGRAM;  Surgeon: Nadeen Landau, MD;  Location: [facility] ORS;  Service: General;  Laterality: N/A;   INTRAUTERINE DEVICE (IUD) INSERTION  10/29/2014   Liletta   LAPAROSCOPIC GASTRIC SLEEVE RESECTION N/A 04/25/2018   Procedure: LAPAROSCOPIC GASTRIC SLEEVE RESECTION, UPPER ENDO, ERAS,  HIATAL HERNIA REPAIR;  Surgeon: Gaynelle Adu, MD;  Location: [facility];  Service: General;  Laterality: N/A;   UPPER GI ENDOSCOPY  04/25/2018   Procedure: UPPER GI ENDOSCOPY;  Surgeon: Gaynelle Adu, MD;  Location: [facility]  ORS;  Service: General;;    Gynecologic History:  No LMP recorded. (Menstrual status: IUD). Contraception: IUD Last Pap: Results were: no abnormalities   Obstetric History: G2P1011  Family History:  Family History  Problem Relation Age of Onset   Heart disease Father    Kidney disease Father    Hypertension Father     Social History:  Social History   Socioeconomic History   Marital status: Married    Spouse name: Not on file   Number of children: Not on file   Years of education: Not on file   Highest education level: Not on file  Occupational History   Not on file  Tobacco Use   Smoking status: Never   Smokeless tobacco: Never  Vaping Use   Vaping Use: Never used  Substance and Sexual Activity   Alcohol use: No   Drug use: No   Sexual activity: Yes    Birth control/protection: I.U.D.    Comment: Mirena  Other Topics Concern   Not on file  Social History Narrative   Not on file   Social Determinants of Health   Financial Resource Strain: Not on file  Food Insecurity: Not on file  Transportation Needs: Not on file  Physical Activity: Not on file  Stress: Not on file  Social Connections: Not on file  Intimate Partner Violence: Not on file    Allergies:  No Known Allergies  Medications: Prior to Admission medications   Medication Sig Start Date End Date Taking? Authorizing Provider  levonorgestrel (MIRENA) 20 MCG/24HR IUD  1 each by Intrauterine route once. Inserted 10/2014   Yes [provider]  phentermine (ADIPEX-P) 37.5 MG tablet Take 1 tablet (37.5 mg total) by mouth daily before breakfast. Patient not taking: Reported on 07/08/2020 03/20/20   [name], MD    Physical Exam Vitals: Blood pressure 120/80, height 5\' 2"  (1.575 m), weight 193 lb (87.5 kg).  General: NAD HEENT: normocephalic, anicteric Thyroid: no enlargement, no palpable nodules Pulmonary: No increased work of breathing, CTAB Cardiovascular: RRR, distal pulses  2+ Breast: Breast symmetrical, no tenderness, no palpable nodules or masses, no skin or nipple retraction present, no nipple discharge.  No axillary or supraclavicular lymphadenopathy. Abdomen: NABS, soft, non-tender, non-distended.  Umbilicus without lesions.  No hepatomegaly, splenomegaly or masses palpable. No evidence of hernia  Genitourinary:  External: Normal external female genitalia.  Normal urethral meatus, normal Bartholin's and Skene's glands.    Vagina: Normal vaginal mucosa, no evidence of prolapse.    Cervix: Grossly normal in appearance, no bleeding  Uterus: Non-enlarged, mobile, normal contour.  No CMT  Adnexa: ovaries non-enlarged, no adnexal masses  Rectal: deferred  Lymphatic: no evidence of inguinal lymphadenopathy Extremities: no edema, erythema, or tenderness Neurologic: Grossly intact Psychiatric: mood appropriate, affect full  Female chaperone present for pelvic and breast  portions of the physical exam    Assessment: 30 y.o. G2P1011 routine annual exam  Plan: Problem List Items Addressed This Visit   None Visit Diagnoses     Cervical cancer screening    -  Primary   Relevant Orders   IGP, Aptima HPV, rfx 16/18,45   Screen for STD (sexually transmitted disease)       Relevant Orders   HEP, RPR, HIV Panel   Hepatitis C Antibody   GC/Chlamydia Probe Amp   Women's annual routine gynecological examination           2) STI screening  wasoffered and accepted  2)  ASCCP guidelines and rational discussed.  Patient opts for every 3 years screening interval  3) Contraception - the patient is currently using  IUD.  She is happy with her current form of contraception and plans to continue  4) Routine healthcare maintenance including cholesterol, diabetes screening discussed managed by PCP  5) No follow-ups on file. We also discussed her desire to drop weight. She has seen [facility] in the past for  Phentermine. Today discussed that weight loss will be most  successful with dietary changes, increased resistance work and  medication. She will schedule an appointment with one of the MD providers for RX.   05-07-1972, CNM  12/29/2020 10:47 AM   [facility] 12/29/2020, 10:47 AM

## 2020-12-30 LAB — HEP, RPR, HIV PANEL
HIV Screen 4th Generation wRfx: NONREACTIVE
Hepatitis B Surface Ag: NEGATIVE
RPR Ser Ql: NONREACTIVE

## 2020-12-30 LAB — HEPATITIS C ANTIBODY: Hep C Virus Ab: <0.1 s/co ratio (ref 0.0–0.9)

## 2020-12-31 ENCOUNTER — Encounter: Payer: Self-pay | Admitting: Obstetrics

## 2020-12-31 LAB — IGP, APTIMA HPV, RFX 16/18,45: HPV Aptima: NEGATIVE

## 2021-01-01 LAB — GC/CHLAMYDIA PROBE AMP
Chlamydia trachomatis, NAA: NEGATIVE
Neisseria Gonorrhoeae by PCR: NEGATIVE

## 2021-01-27 ENCOUNTER — Other Ambulatory Visit: Payer: Self-pay

## 2021-01-27 ENCOUNTER — Ambulatory Visit (INDEPENDENT_AMBULATORY_CARE_PROVIDER_SITE_OTHER): Payer: Managed Care, Other (non HMO) | Admitting: Obstetrics & Gynecology

## 2021-01-27 ENCOUNTER — Encounter: Payer: Self-pay | Admitting: Obstetrics & Gynecology

## 2021-01-27 VITALS — BP 120/80 | Ht 62.0 in | Wt 195.0 lb

## 2021-01-27 DIAGNOSIS — E669 Obesity, unspecified: Secondary | ICD-10-CM | POA: Diagnosis not present

## 2021-01-27 DIAGNOSIS — Z6835 Body mass index (BMI) 35.0-35.9, adult: Secondary | ICD-10-CM | POA: Diagnosis not present

## 2021-01-27 MED ORDER — PHENTERMINE HCL 37.5 MG PO TABS: 37.5000 mg | ORAL_TABLET | Freq: Every day | ORAL | 0 refills | Status: DC

## 2021-01-27 MED ORDER — TOPIRAMATE 25 MG PO TABS: 25.0000 mg | ORAL_TABLET | Freq: Two times a day (BID) | ORAL | 6 refills | Status: DC

## 2021-01-27 NOTE — Progress Notes (Signed)
HPI:  Patient is a 29 y.o. G2P1011 presenting for evaluation of abnormal weight gain.  The patient has gained weight and has a hard time losing weight oveer the last few years; she desires pregnancy soon (2023) and would like to lose to a helathy BMI prior to starting.  She has been stck in the 190s for a while now.  SHe has tried Phentermine in past, but dod not use for more than a month.  Prior use she lost about 5 pounds over the past month..  She feels she has gained weight due to problems with her nutrition and physical activity.  She has these associated symptoms:  PCOS, and has IUD for 2 years to manage this.  Recently she has started light monthly periods again, after 2 yrs no periods w Mirena..  She has tried self-directed dieting and exercise  in the past with little success. PMHx: She  has a past medical history of Anxiety, Blood type, Rh positive, Cervical insufficiency in pregnancy, antepartum, GERD (gastroesophageal reflux disease), Obesity, and Polycystic disease, ovaries. Also,  has a past surgical history that includes Cervical cerclage (2016); Cesarean section (N/A, 09/02/2014); Cholecystectomy (N/A, 05/02/2015); Intrauterine device (iud) insertion (10/29/2014); Laparoscopic gastric sleeve resection (N/A, 04/25/2018); and Upper gi endoscopy (04/25/2018)., family history includes Heart disease in her father; Hypertension in her father; Kidney disease in her father.,  reports that she has never smoked. She has never used smokeless tobacco. She reports that she does not drink alcohol and does not use drugs.  She has a current medication list which includes the following prescription(s): levonorgestrel, topiramate, and phentermine. Also, has No Known Allergies.  Review of Systems  Constitutional:  Negative for chills, fever and malaise/fatigue.  HENT:  Negative for congestion, sinus pain and sore throat.   Eyes:  Negative for blurred vision and pain.  Respiratory:  Negative for cough and  wheezing.   Cardiovascular:  Negative for chest pain and leg swelling.  Gastrointestinal:  Negative for abdominal pain, constipation, diarrhea, heartburn, nausea and vomiting.  Genitourinary:  Negative for dysuria, frequency, hematuria and urgency.  Musculoskeletal:  Negative for back pain, joint pain, myalgias and neck pain.  Skin:  Negative for itching and rash.  Neurological:  Negative for dizziness, tremors and weakness.  Endo/Heme/Allergies:  Does not bruise/bleed easily.  Psychiatric/Behavioral:  Negative for depression. The patient is not nervous/anxious and does not have insomnia.    Objective: BP 120/80    Ht 5\' 2"  (1.575 m)    Wt 195 lb (88.5 kg)    BMI 35.67 kg/m  Physical Exam Constitutional:      General: She is not in acute distress.    Appearance: She is well-developed.  Musculoskeletal:        General: Normal range of motion.  Neurological:     Mental Status: She is alert and oriented to person, place, and time.  Skin:    General: Skin is warm and dry.  Vitals reviewed.    ASSESSMENT:  obesity, BMI 35  Plan: Will assist patient in incorporating positive experiences into her life to promote a positive mental attitude.  Education given regarding appropriate lifestyle changes for weight loss, including regular physical activity, healthy coping strategies, caloric restriction, and healthy eating patterns.  Patient is started on prescription appetite suppressants: Phentermine along w Topamax .    The risks and benefits as well as side effects of medication, such as Phenteramine or Tenuate, is discussed.  The pros and cons of suppressing appetite and  boosting metabolism is counseled.  Risks of tolerance and addiction discussed.  Use of medicine will be short term.  Pt to call with any negative side effects and agrees to keep follow up appointments.  Continue Mirena IUD for now; monitor bleeding side effects. Preconceptual counseling discussed today.  Annamarie Major, MD,  [facility], [facility] 01/27/2021  9:52 AM

## 2021-02-03 ENCOUNTER — Telehealth: Payer: Self-pay

## 2021-02-03 ENCOUNTER — Other Ambulatory Visit: Payer: Self-pay | Admitting: Obstetrics

## 2021-02-03 DIAGNOSIS — N898 Other specified noninflammatory disorders of vagina: Secondary | ICD-10-CM

## 2021-02-03 MED ORDER — FLUCONAZOLE 150 MG PO TABS: 150.0000 mg | ORAL_TABLET | Freq: Once | ORAL | 0 refills | Status: AC

## 2021-02-03 NOTE — Telephone Encounter (Signed)
Pt states she has a yeast infection and said she usually gets a diflucan sent in, Pt aware AMS is not in the office and wanted to see if MMF would send it in since she recently seen her, Please advise.

## 2021-02-03 NOTE — Progress Notes (Signed)
Patient calls in requesting Diflucan for yeast symptoms. Rx sent to her pharmacy. Mirna Mires, CNM  02/03/2021 3:33 PM

## 2021-02-25 ENCOUNTER — Ambulatory Visit (INDEPENDENT_AMBULATORY_CARE_PROVIDER_SITE_OTHER): Payer: Managed Care, Other (non HMO) | Admitting: Obstetrics & Gynecology

## 2021-02-25 ENCOUNTER — Encounter: Payer: Self-pay | Admitting: Obstetrics & Gynecology

## 2021-02-25 ENCOUNTER — Other Ambulatory Visit: Payer: Self-pay

## 2021-02-25 VITALS — Ht 62.0 in | Wt 185.0 lb

## 2021-02-25 DIAGNOSIS — Z6835 Body mass index (BMI) 35.0-35.9, adult: Secondary | ICD-10-CM

## 2021-02-25 DIAGNOSIS — E669 Obesity, unspecified: Secondary | ICD-10-CM | POA: Diagnosis not present

## 2021-02-25 DIAGNOSIS — E66811 Obesity, class 1: Secondary | ICD-10-CM

## 2021-02-25 MED ORDER — PHENTERMINE HCL 37.5 MG PO TABS: 37.5000 mg | ORAL_TABLET | Freq: Every day | ORAL | 1 refills | Status: DC

## 2021-02-25 NOTE — Progress Notes (Signed)
Virtual Visit via Telephone Note  I connected with CHRISTA FASIG on 02/25/21 at 10:55 AM EST by telephone and verified that I am speaking with the correct person using two identifiers.  Location: Patient: home  Provider: office   I discussed the limitations, risks, security and privacy concerns of performing an evaluation and management service by telephone and the availability of in person appointments. I also discussed with the patient that there may be a patient responsible charge related to this service. The patient expressed understanding and agreed to proceed.   History of Present Illness:   Regina Chambers is a 31 y.o. who was started on  Current Outpatient Medications on File Prior to Visit  Medication Sig Dispense Refill         phentermine (ADIPEX-P) 37.5 MG tablet Take 1 tablet (37.5 mg total) by mouth daily before breakfast. 30 tablet 0   topiramate (TOPAMAX) 25 MG tablet Take 1 tablet (25 mg total) by mouth 2 (two) times daily. 60 tablet 6   No current facility-administered medications on file prior to visit.   approximately 1 months ago due to obesity/abnormal weight gain. The patient has lost 10 pounds over the past 1 month due to Meds..   She has these side effects: none.  PMHx: She  has a past medical history of Anxiety, Blood type, Rh positive, Cervical insufficiency in pregnancy, antepartum, GERD (gastroesophageal reflux disease), Obesity, and Polycystic disease, ovaries. Also,  has a past surgical history that includes Cervical cerclage (2016); Cesarean section (N/A, 09/02/2014); Cholecystectomy (N/A, 05/02/2015); Intrauterine device (iud) insertion (10/29/2014); Laparoscopic gastric sleeve resection (N/A, 04/25/2018); and Upper gi endoscopy (04/25/2018)., family history includes Heart disease in her father; Hypertension in her father; Kidney disease in her father.,  reports that she has never smoked. She has never used smokeless tobacco. She reports that she does  not drink alcohol and does not use drugs.  She has a current medication list which includes the following prescription(s): levonorgestrel, phentermine, and topiramate. Also, has No Known Allergies.  Review of Systems  All other systems reviewed and are negative.   Observations/Objective: No exam today, due to telephone eVisit due to [facility] virus restriction on elective visits and procedures.  Prior visits reviewed along with ultrasounds/labs as indicated. Home weight 285  Assessment and Plan:   ICD-10-CM   1. Obesity (BMI 30.0-34.9)  E66.9     Phentermine and Topamax  Will continue to assist patient in incorporating positive experiences into her life to promote a positive mental attitude.  Education given regarding appropriate lifestyle changes for weight loss, including regular physical activity, healthy coping strategies, caloric restriction, and healthy eating patterns.  The risks and benefits as well as side effects of medication, such as Phenteramine or Tenuate, is discussed.  The pros and cons of suppressing appetite and boosting metabolism is counseled.  Risks of tolerance and addiction discussed.  Use of medicine will be short term.  Pt to call with any negative side effects and agrees to keep follow up appointments.  Follow Up Instructions: 2 months   I discussed the assessment and treatment plan with the patient. The patient was provided an opportunity to ask questions and all were answered. The patient agreed with the plan and demonstrated an understanding of the instructions.   The patient was advised to call back or seek an in-person evaluation if the symptoms worsen or if the condition fails to improve as anticipated.  I provided 15 minutes of non-face-to-face time during this  encounter.   Hoyt Koch, MD

## 2021-03-16 ENCOUNTER — Telehealth: Payer: Self-pay | Admitting: Obstetrics & Gynecology

## 2021-03-16 NOTE — Telephone Encounter (Signed)
Called pt to reschedule appt for March 14 and see if she will be available on March 17. Couldn't leave voice mail because it was full. Sent Regina Chambers.

## 2021-04-21 ENCOUNTER — Ambulatory Visit: Payer: Managed Care, Other (non HMO) | Admitting: Obstetrics & Gynecology

## 2021-04-24 ENCOUNTER — Other Ambulatory Visit: Payer: Self-pay

## 2021-04-24 ENCOUNTER — Ambulatory Visit: Payer: Managed Care, Other (non HMO) | Admitting: Obstetrics & Gynecology

## 2021-05-12 ENCOUNTER — Ambulatory Visit: Payer: Managed Care, Other (non HMO) | Admitting: Licensed Practical Nurse

## 2021-07-15 ENCOUNTER — Other Ambulatory Visit: Payer: Self-pay | Admitting: Advanced Practice Midwife

## 2021-07-15 ENCOUNTER — Telehealth: Payer: Self-pay

## 2021-07-15 DIAGNOSIS — B009 Herpesviral infection, unspecified: Secondary | ICD-10-CM

## 2021-07-15 MED ORDER — VALACYCLOVIR HCL 500 MG PO TABS: 500.0000 mg | ORAL_TABLET | Freq: Every day | ORAL | 11 refills | Status: DC

## 2021-07-15 NOTE — Telephone Encounter (Signed)
Pt calling; is having issues; needs acyclovir refilled; hasn't used it in over a year; can she just start the medication?  [phone]  Pt states she is under a lot of stress and is having a flare up.  Wants pill form.  Pharm correct.

## 2021-07-15 NOTE — Progress Notes (Unsigned)
Rx valtrex sent per patient request 

## 2021-07-16 NOTE — Telephone Encounter (Signed)
Pt aware and appreciative  

## 2021-08-17 ENCOUNTER — Other Ambulatory Visit: Payer: Self-pay

## 2021-08-17 DIAGNOSIS — B009 Herpesviral infection, unspecified: Secondary | ICD-10-CM

## 2021-08-17 MED ORDER — VALACYCLOVIR HCL 500 MG PO TABS: 500.0000 mg | ORAL_TABLET | Freq: Every day | ORAL | 11 refills | Status: DC

## 2021-08-17 NOTE — Telephone Encounter (Signed)
Regina Chambers called triage line her Valtrex was sent to the wrong pharmacy.

## 2021-08-17 NOTE — Telephone Encounter (Signed)
Called Regina Chambers back and verified her pharmacy it was going [facility] and she now uses the [facility] [address] so I sent in another prescription for her.

## 2021-10-14 ENCOUNTER — Ambulatory Visit (INDEPENDENT_AMBULATORY_CARE_PROVIDER_SITE_OTHER): Payer: Managed Care, Other (non HMO) | Admitting: Nurse Practitioner

## 2021-10-14 ENCOUNTER — Encounter: Payer: Self-pay | Admitting: Nurse Practitioner

## 2021-10-14 VITALS — BP 110/74 | HR 90 | Temp 96.8°F | Resp 10 | Ht 62.0 in | Wt 193.0 lb

## 2021-10-14 DIAGNOSIS — Z7689 Persons encountering health services in other specified circumstances: Secondary | ICD-10-CM | POA: Diagnosis not present

## 2021-10-14 DIAGNOSIS — E6609 Other obesity due to excess calories: Secondary | ICD-10-CM | POA: Diagnosis not present

## 2021-10-14 DIAGNOSIS — Z6835 Body mass index (BMI) 35.0-35.9, adult: Secondary | ICD-10-CM

## 2021-10-14 DIAGNOSIS — E669 Obesity, unspecified: Secondary | ICD-10-CM

## 2021-10-14 DIAGNOSIS — Z9884 Bariatric surgery status: Secondary | ICD-10-CM

## 2021-10-14 DIAGNOSIS — B009 Herpesviral infection, unspecified: Secondary | ICD-10-CM

## 2021-10-14 MED ORDER — PHENTERMINE HCL 37.5 MG PO TABS: 37.5000 mg | ORAL_TABLET | Freq: Every day | ORAL | 0 refills | Status: DC

## 2021-10-14 NOTE — Progress Notes (Signed)
New Patient Office Visit  Subjective    Patient ID: Regina Chambers, female    DOB: [birth year]  Age: 31 y.o. MRN: [id number]  CC:  Chief Complaint  Patient presents with   Establish Care    Use to see Nicki Reaper in 2019, went to [facility] for gyn   discuss getting back on phentermine    Took it for one month only this year and then gyn provider left and could not get it refilled    HPI Regina Chambers presents to establish care  Weight gain: states that she has used phentermine in the past and had some success. States that she has been as high as 280 and down to 160. States that she has had a gastric sleeve in the past and felt that her weight is creeping back up.  States that she mainly snacks through the day. Water all day.   Has not used any other weight loss. States that she has used totipalmate in the past.   Exercise: states that for the past month she has been doing it twice a week. States that she will walk and started on weight lifting and will do 45 mins at time.         Outpatient Encounter Medications as of 10/14/2021  Medication Sig   levonorgestrel (MIRENA) 20 MCG/24HR IUD 1 each by Intrauterine route once. Inserted 10/2014   phentermine (ADIPEX-P) 37.5 MG tablet Take 1 tablet (37.5 mg total) by mouth daily before breakfast.   valACYclovir (VALTREX) 500 MG tablet Take 1 tablet (500 mg total) by mouth daily.   [DISCONTINUED] phentermine (ADIPEX-P) 37.5 MG tablet Take 1 tablet (37.5 mg total) by mouth daily before breakfast. (Patient not taking: Reported on 10/14/2021)   [DISCONTINUED] topiramate (TOPAMAX) 25 MG tablet Take 1 tablet (25 mg total) by mouth 2 (two) times daily.   No facility-administered encounter medications on file as of 10/14/2021.    Past Medical History:  Diagnosis Date   Anxiety    Blood type, Rh positive    Cervical insufficiency in pregnancy, antepartum    prior pregnancy   GERD (gastroesophageal reflux disease)    Obesity     Polycystic disease, ovaries     Past Surgical History:  Procedure Laterality Date   CERVICAL CERCLAGE  2016   CESAREAN SECTION N/A 09/02/2014   Procedure: CESAREAN SECTION;  Surgeon: [name], MD;  Location: [facility] ORS;  Service: Obstetrics;  Laterality: N/A;   CHOLECYSTECTOMY N/A 05/02/2015   Procedure: LAPAROSCOPIC CHOLECYSTECTOMY WITH INTRAOPERATIVE CHOLANGIOGRAM;  Surgeon: Nadeen Landau, MD;  Location: [facility] ORS;  Service: General;  Laterality: N/A;   INTRAUTERINE DEVICE (IUD) INSERTION  10/29/2014   Liletta   LAPAROSCOPIC GASTRIC SLEEVE RESECTION N/A 04/25/2018   Procedure: LAPAROSCOPIC GASTRIC SLEEVE RESECTION, UPPER ENDO, ERAS,  HIATAL HERNIA REPAIR;  Surgeon: Gaynelle Adu, MD;  Location: [facility];  Service: General;  Laterality: N/A;   UPPER GI ENDOSCOPY  04/25/2018   Procedure: UPPER GI ENDOSCOPY;  Surgeon: Gaynelle Adu, MD;  Location: [facility];  Service: General;;    Family History  Problem Relation Age of Onset   Heart disease Father    Kidney disease Father    Hypertension Father    Diabetes Paternal Grandfather    Hypertension Paternal Grandfather     Social History   Socioeconomic History   Marital status: Legally Separated    Spouse name: Not on file   Number of children: 1   Years of education: Not  on file   Highest education level: Associate degree: academic program  Occupational History   Not on file  Tobacco Use   Smoking status: Never   Smokeless tobacco: Never  Vaping Use   Vaping Use: Never used  Substance and Sexual Activity   Alcohol use: Yes    Comment: social-maybe 1 to 2 times a month   Drug use: No   Sexual activity: Yes    Birth control/protection: I.U.D.    Comment: Mirena  Other Topics Concern   Not on file  Social History Narrative   Fulltime: [employer]    Social Determinants of Health   Financial Resource Strain: Not on file  Food Insecurity: Not on file  Transportation Needs: Not on file  Physical Activity:  Not on file  Stress: Not on file  Social Connections: Not on file  Intimate Partner Violence: Not on file    Review of Systems  Constitutional:  Negative for chills and fever.  Respiratory:  Negative for shortness of breath.   Cardiovascular:  Negative for chest pain, palpitations and leg swelling.  Gastrointestinal:  Negative for abdominal pain, blood in stool, constipation, diarrhea, nausea and vomiting.       BM everyother day   Neurological:  Negative for tingling and headaches.  Psychiatric/Behavioral:  Negative for hallucinations and suicidal ideas.         Objective    BP 110/74   Pulse 90   Temp (!) 96.8 F (36 C)   Resp 10   Ht 5\' 2"  (1.575 m)   Wt 193 lb (87.5 kg)   SpO2 98%   BMI 35.30 kg/m   Physical Exam Vitals and nursing note reviewed.  Constitutional:      Appearance: Normal appearance. She is obese.  Cardiovascular:     Rate and Rhythm: Normal rate and regular rhythm.     Heart sounds: Normal heart sounds.  Pulmonary:     Effort: Pulmonary effort is normal.     Breath sounds: Normal breath sounds.  Abdominal:     General: Bowel sounds are normal.  Musculoskeletal:     Right lower leg: No edema.     Left lower leg: No edema.  Neurological:     Mental Status: She is alert.         Assessment & Plan:   Problem List Items Addressed This Visit       Other   Class 2 obesity due to excess calories without serious comorbidity with body mass index (BMI) of 35.0 to 35.9 in adult   Relevant Medications   phentermine (ADIPEX-P) 37.5 MG tablet   Obesity (BMI 30-39.9)    Patient is working on exercise and seems to be snacking mainly through the day versus having structured meals.  She has been on phentermine in the past and then well.  We will start her on phentermine 37.5 mg daily for 1 month follow-up in 1 month.  If tolerating it well can continue for 2 more months then need to discontinue.  Did discuss other options such as or [place].   Patient recently had labs drawn through her employer [employer] and will send me the data via MyChart      Relevant Medications   phentermine (ADIPEX-P) 37.5 MG tablet   S/P laparoscopic sleeve gastrectomy    Was performed Dr. [place] [facility] surgery.  Patient states her weight has been fluctuating since having the surgery and now she feels like she is gaining  weight      HSV-2 infection    She will take valacyclovir as needed.  Was maintained through GYN      Establishing care with new doctor, encounter for - Primary    Patient used to be seen by previous colleague has been over 3 years that she has been evaluated in office.  New patient today       Return in about 4 weeks (around 11/11/2021) for Medication recheck/ weight recheck .   Audria Nine, NP

## 2021-10-14 NOTE — Assessment & Plan Note (Signed)
Patient is working on exercise and seems to be snacking mainly through the day versus having structured meals.  She has been on phentermine in the past and then well.  We will start her on phentermine 37.5 mg daily for 1 month follow-up in 1 month.  If tolerating it well can continue for 2 more months then need to discontinue.  Did discuss other options such as [place] or [place].  Patient recently had labs drawn through her employer [employer] and will send me the data via MyChart

## 2021-10-14 NOTE — Assessment & Plan Note (Signed)
Was performed Dr. Wannetta Sender surgery.  Patient states her weight has been fluctuating since having the surgery and now she feels like she is gaining weight

## 2021-10-14 NOTE — Patient Instructions (Addendum)
Nice to see you today I want to see you in 1 month to see how you are doing with the medication Follow up in 1 month.  I have attached information about the injection that helps with weight loss If you experience palpitations or trouble sleeping on this medication let me know    Send me the lab results via mychart

## 2021-10-14 NOTE — Assessment & Plan Note (Signed)
She will take valacyclovir as needed.  Was maintained through GYN

## 2021-10-14 NOTE — Assessment & Plan Note (Signed)
Patient used to be seen by previous colleague has been over 3 years that she has been evaluated in office.  New patient today

## 2021-11-11 ENCOUNTER — Ambulatory Visit: Payer: Managed Care, Other (non HMO) | Admitting: Nurse Practitioner

## 2021-11-11 ENCOUNTER — Telehealth: Payer: Self-pay | Admitting: *Deleted

## 2021-11-11 NOTE — Telephone Encounter (Signed)
Called patient and she stated already handled.

## 2021-11-11 NOTE — Telephone Encounter (Signed)
PLEASE NOTE: All timestamps contained within this report are represented as [place] Standard Time. CONFIDENTIALTY NOTICE: This fax transmission is intended only for the addressee. It contains information that is legally privileged, confidential or otherwise protected from use or disclosure. If you are not the intended recipient, you are strictly prohibited from reviewing, disclosing, copying using or disseminating any of this information or taking any action in reliance on or regarding this information. If you have received this fax in error, please notify us immediately by telephone so that we can arrange for its return to [place]. 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 Patient request to speak to RN No Disp. Time Disposition Final User 11/11/2021 7:51:54 AM General Information Provided Yes Kenton Kingfisher, Lanette Call Closed By: Nelia Shi Transaction Date/Time: 11/11/2021 7:49:21 AM (ET)

## 2021-11-11 NOTE — Telephone Encounter (Signed)
Weight management with phentermine needs to be in office

## 2021-11-11 NOTE — Telephone Encounter (Signed)
Matt, this appointment was to follow up on weght and medication check. Does patient need to reschedule or virtual ok?

## 2021-11-16 ENCOUNTER — Ambulatory Visit: Payer: Managed Care, Other (non HMO) | Admitting: Nurse Practitioner

## 2022-02-16 ENCOUNTER — Ambulatory Visit (LOCAL_COMMUNITY_HEALTH_CENTER): Payer: Managed Care, Other (non HMO)

## 2022-02-16 DIAGNOSIS — Z111 Encounter for screening for respiratory tuberculosis: Secondary | ICD-10-CM

## 2022-02-16 DIAGNOSIS — Z23 Encounter for immunization: Secondary | ICD-10-CM

## 2022-02-16 DIAGNOSIS — Z719 Counseling, unspecified: Secondary | ICD-10-CM

## 2022-02-19 ENCOUNTER — Ambulatory Visit (LOCAL_COMMUNITY_HEALTH_CENTER): Payer: Managed Care, Other (non HMO)

## 2022-02-19 DIAGNOSIS — Z111 Encounter for screening for respiratory tuberculosis: Secondary | ICD-10-CM

## 2022-02-19 LAB — TB SKIN TEST
Induration: 0 mm
TB Skin Test: NEGATIVE

## 2022-02-23 ENCOUNTER — Ambulatory Visit (LOCAL_COMMUNITY_HEALTH_CENTER): Payer: Managed Care, Other (non HMO)

## 2022-02-23 DIAGNOSIS — Z111 Encounter for screening for respiratory tuberculosis: Secondary | ICD-10-CM

## 2022-02-26 ENCOUNTER — Ambulatory Visit: Payer: Managed Care, Other (non HMO)

## 2022-02-26 ENCOUNTER — Other Ambulatory Visit: Payer: Managed Care, Other (non HMO)

## 2022-02-26 DIAGNOSIS — Z111 Encounter for screening for respiratory tuberculosis: Secondary | ICD-10-CM

## 2022-02-26 LAB — TB SKIN TEST
Induration: 0 mm
TB Skin Test: NEGATIVE

## 2022-06-07 ENCOUNTER — Encounter: Payer: Self-pay | Admitting: Obstetrics and Gynecology

## 2022-06-07 DIAGNOSIS — R87810 Cervical high risk human papillomavirus (HPV) DNA test positive: Secondary | ICD-10-CM | POA: Insufficient documentation

## 2022-06-07 DIAGNOSIS — A6004 Herpesviral vulvovaginitis: Secondary | ICD-10-CM | POA: Insufficient documentation

## 2022-06-07 NOTE — Progress Notes (Unsigned)
PCP:  Eden Emms, NP   No chief complaint on file.    HPI:      Ms. Regina Chambers is a 32 y.o. G2P1011 whose LMP was No LMP recorded. (Menstrual status: IUD)., presents today for her annual examination.  Her menses are {norm/abn:715}, lasting {number: 22536} days.  Dysmenorrhea {dysmen:716}. She {does:18564} have intermenstrual bleeding. Hx of PCOS   Sex activity: {sex active: 315163}. Mirena IUD placed 08/18/17 Last Pap: 12/29/20 Results were: no abnormalities /neg HPV DNA ; POS HPV DNA 11/2019 Hx of STDs: HSV 2 on culture, on valtrex daily  There is no FH of breast cancer. There is no FH of ovarian cancer. The patient {does:18564} do self-breast exams.  Tobacco use: {tob:20664} Alcohol use: {Alcohol:11675} No drug use.  Exercise: {exercise:31265}  She {does:18564} get adequate calcium and Vitamin D in her diet.  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/25/2018   Obesity (BMI 30-39.9) 08/18/2017   Class 2 obesity due to excess calories without serious comorbidity with body mass index (BMI) of 35.0 to 35.9 in adult 04/22/2017   PCOS (polycystic ovarian syndrome) 04/10/2015    Past Surgical History:  Procedure Laterality Date   CERVICAL CERCLAGE  2016   CESAREAN SECTION N/A 09/02/2014   Procedure: CESAREAN SECTION;  Surgeon: [name], MD;  Location: [facility] ORS;  Service: Obstetrics;  Laterality: N/A;   CHOLECYSTECTOMY N/A 05/02/2015   Procedure: LAPAROSCOPIC CHOLECYSTECTOMY WITH INTRAOPERATIVE CHOLANGIOGRAM;  Surgeon: Nadeen Landau, MD;  Location: [facility] ORS;  Service: General;  Laterality: N/A;   INTRAUTERINE DEVICE (IUD) INSERTION  10/29/2014   [place]   LAPAROSCOPIC GASTRIC SLEEVE RESECTION N/A 04/25/2018   Procedure: LAPAROSCOPIC GASTRIC SLEEVE RESECTION, UPPER ENDO, ERAS,  HIATAL  HERNIA REPAIR;  Surgeon: Gaynelle Adu, MD;  Location: [facility];  Service: General;  Laterality: N/A;   UPPER GI ENDOSCOPY  04/25/2018   Procedure: UPPER GI ENDOSCOPY;  Surgeon: Gaynelle Adu, MD;  Location: [facility];  Service: General;;    Family History  Problem Relation Age of Onset   Heart disease Father    Kidney disease Father    Hypertension Father    Diabetes Paternal Grandfather    Hypertension Paternal Grandfather     Social History   Socioeconomic History   Marital status: Legally Separated    Spouse name: Not on file   Number of children: 1   Years of education: Not on file   Highest education level: Associate degree: academic program  Occupational History   Not on file  Tobacco Use   Smoking status: Never   Smokeless tobacco: Never  Vaping Use   Vaping Use: Never used  Substance and Sexual Activity   Alcohol use: Yes    Comment: social-maybe 1 to 2 times a month   Drug use: No   Sexual activity: Yes    Birth control/protection: I.U.D.    Comment: Mirena  Other Topics Concern   Not on file  Social History Narrative   Fulltime: [employer]    Social Determinants of Health   Financial Resource Strain: Not on file  Food Insecurity: Not on file  Transportation Needs: Not on file  Physical Activity: Not on file  Stress: Not on file  Social Connections: Not on file  Intimate Partner Violence: Not on file  Current Outpatient Medications:    levonorgestrel (MIRENA) 20 MCG/24HR IUD, 1 each by Intrauterine route once. Inserted 10/2014, Disp: , Rfl:    phentermine (ADIPEX-P) 37.5 MG tablet, Take 1 tablet (37.5 mg total) by mouth daily before breakfast., Disp: 30 tablet, Rfl: 0   valACYclovir (VALTREX) 500 MG tablet, Take 1 tablet (500 mg total) by mouth daily., Disp: 30 tablet, Rfl: 11     ROS:  Review of Systems BREAST: No symptoms   Objective: There were no vitals taken for this visit.   OBGyn Exam  Results: No results found for this  or any previous visit (from the past 24 hour(s)).  Assessment/Plan: No diagnosis found.  No orders of the defined types were placed in this encounter.            GYN counsel {counseling: 16159}     F/U  No follow-ups on file.  Muscab Brenneman B. Rosangelica Pevehouse, PA-C 06/07/2022 2:07 PM

## 2022-06-08 ENCOUNTER — Encounter: Payer: Self-pay | Admitting: Obstetrics and Gynecology

## 2022-06-08 ENCOUNTER — Ambulatory Visit (INDEPENDENT_AMBULATORY_CARE_PROVIDER_SITE_OTHER): Payer: Managed Care, Other (non HMO) | Admitting: Obstetrics and Gynecology

## 2022-06-08 ENCOUNTER — Other Ambulatory Visit (HOSPITAL_COMMUNITY)
Admission: RE | Admit: 2022-06-08 | Discharge: 2022-06-08 | Disposition: A | Discharge status: Home / Self Care | Payer: Managed Care, Other (non HMO) | Source: Ambulatory Visit | Attending: Obstetrics and Gynecology | Admitting: Obstetrics and Gynecology

## 2022-06-08 VITALS — BP 110/70 | Ht 62.0 in | Wt 194.0 lb

## 2022-06-08 DIAGNOSIS — Z3169 Encounter for other general counseling and advice on procreation: Secondary | ICD-10-CM

## 2022-06-08 DIAGNOSIS — R87810 Cervical high risk human papillomavirus (HPV) DNA test positive: Secondary | ICD-10-CM | POA: Insufficient documentation

## 2022-06-08 DIAGNOSIS — Z1151 Encounter for screening for human papillomavirus (HPV): Secondary | ICD-10-CM | POA: Insufficient documentation

## 2022-06-08 DIAGNOSIS — Z30431 Encounter for routine checking of intrauterine contraceptive device: Secondary | ICD-10-CM

## 2022-06-08 DIAGNOSIS — A6004 Herpesviral vulvovaginitis: Secondary | ICD-10-CM

## 2022-06-08 DIAGNOSIS — Z30432 Encounter for removal of intrauterine contraceptive device: Secondary | ICD-10-CM

## 2022-06-08 DIAGNOSIS — Z124 Encounter for screening for malignant neoplasm of cervix: Secondary | ICD-10-CM

## 2022-06-08 DIAGNOSIS — Z01419 Encounter for gynecological examination (general) (routine) without abnormal findings: Secondary | ICD-10-CM

## 2022-06-08 NOTE — Patient Instructions (Signed)
I value your feedback and you entrusting us with your care. If you get a [facility] patient survey, I would appreciate you taking the time to let us know about your experience today. Thank you! ? ? ?

## 2022-06-10 LAB — CYTOLOGY - PAP
Comment: NEGATIVE
Diagnosis: NEGATIVE
High risk HPV: NEGATIVE

## 2023-01-13 ENCOUNTER — Encounter: Admission: RE | Payer: Self-pay | Source: Ambulatory Visit

## 2023-01-13 ENCOUNTER — Ambulatory Visit
Admission: RE | Admit: 2023-01-13 | Payer: Managed Care, Other (non HMO) | Source: Ambulatory Visit | Admitting: Obstetrics and Gynecology

## 2023-01-13 SURGERY — HYSTEROSCOPY
Anesthesia: Choice

## 2023-01-14 ENCOUNTER — Ambulatory Visit: Payer: Managed Care, Other (non HMO) | Admitting: Nurse Practitioner

## 2023-01-14 ENCOUNTER — Encounter: Payer: Self-pay | Admitting: Nurse Practitioner

## 2023-01-14 DIAGNOSIS — E282 Polycystic ovarian syndrome: Secondary | ICD-10-CM | POA: Diagnosis not present

## 2023-01-14 DIAGNOSIS — R7303 Prediabetes: Secondary | ICD-10-CM | POA: Insufficient documentation

## 2023-01-14 DIAGNOSIS — Z23 Encounter for immunization: Secondary | ICD-10-CM | POA: Diagnosis not present

## 2023-01-14 MED ORDER — PHENTERMINE HCL 37.5 MG PO TABS: 37.5000 mg | ORAL_TABLET | Freq: Every day | ORAL | 0 refills | Status: DC

## 2023-01-14 NOTE — Patient Instructions (Signed)
Nice to see you today We updated your flu vaccine today Follow up with me in 1 month, sooner if you need me

## 2023-01-14 NOTE — Assessment & Plan Note (Signed)
Patient has history of gastric resection surgery.  Recently come off birth control working on fertility treatments and patient is being continually gaining weight.  We discussed that all antiobesity medications are not safe during pregnancy.  Will start patient on phentermine 37.5 mg daily for 30 days follow-up in office.  Patient is to continue working on lifestyle modifications and trying to incorporate exercise into her lifestyle

## 2023-01-14 NOTE — Progress Notes (Signed)
   Established Patient Office Visit  Subjective   Patient ID: Regina Chambers, female    DOB: 17-May-1990  Age: 32 y.o. MRN: [id number]  Chief Complaint  Patient presents with   Weight Loss   Flu Vaccine    HPI  Obesity: patinet is interested in medical weight loss she does have comorbdiities of PCOS, prediabetes and hisotry of gastric sleeve. States that she has struggled with her weight her whole life. States that she got off her IUD in April and has been gaining weight. States that she is seen through [facility] for fertility treatment  States that she has had gastric surgery and sancks through the day. She is a grazer vs a meal eater. Staes that she has not been working out     Review of Systems  Constitutional:  Negative for chills and fever.  Respiratory:  Negative for shortness of breath.   Cardiovascular:  Negative for chest pain.  Neurological:  Negative for headaches.      Objective:     BP 110/78   Pulse 70   Temp 98 F (36.7 C) (Oral)   Ht 5\' 2"  (1.575 m)   Wt 204 lb 12.8 oz (92.9 kg)   LMP 01/09/2023 (Exact Date) Comment: took IUD out in april.  SpO2 98%   BMI 37.46 kg/m    Physical Exam Vitals and nursing note reviewed.  Constitutional:      Appearance: Normal appearance.  Cardiovascular:     Rate and Rhythm: Normal rate and regular rhythm.     Heart sounds: Normal heart sounds.  Pulmonary:     Effort: Pulmonary effort is normal.     Breath sounds: Normal breath sounds.  Abdominal:     General: Bowel sounds are normal.  Neurological:     Mental Status: She is alert.      No results found for any visits on 01/14/23.    The ASCVD Risk score (Arnett DK, et al., 2019) failed to calculate for the following reasons:   The 2019 ASCVD risk score is only valid for ages 29 to 20    Assessment & Plan:   Problem List Items Addressed This Visit       Endocrine   PCOS (polycystic ovarian syndrome)   Relevant Orders   Comprehensive metabolic  panel     Other   Morbid obesity (HCC) - Primary    Patient has history of gastric resection surgery.  Recently come off birth control working on fertility treatments and patient is being continually gaining weight.  We discussed that all antiobesity medications are not safe during pregnancy.  Will start patient on phentermine 37.5 mg daily for 30 days follow-up in office.  Patient is to continue working on lifestyle modifications and trying to incorporate exercise into her lifestyle      Relevant Medications   phentermine (ADIPEX-P) 37.5 MG tablet   Other Relevant Orders   Comprehensive metabolic panel   Hemoglobin A1c   Lipid panel   TSH   Prediabetes   Relevant Orders   Comprehensive metabolic panel   Hemoglobin A1c   Lipid panel   Other Visit Diagnoses     Need for influenza vaccination       Relevant Orders   Flu vaccine trivalent PF, 6mos and older(Flulaval,Afluria,Fluarix,Fluzone) (Completed)       Return in about 4 weeks (around 02/11/2023) for weight/med recheck .    Audria Nine, NP

## 2023-01-15 LAB — COMPREHENSIVE METABOLIC PANEL
ALT: 21 [IU]/L (ref 0–32)
AST: 13 [IU]/L (ref 0–40)
Albumin: 4.2 g/dL (ref 3.9–4.9)
Alkaline Phosphatase: 65 [IU]/L (ref 44–121)
BUN/Creatinine Ratio: 17 (ref 9–23)
BUN: 13 mg/dL (ref 6–20)
Bilirubin Total: 0.4 mg/dL (ref 0.0–1.2)
CO2: 28 mmol/L (ref 20–29)
Calcium: 9.2 mg/dL (ref 8.7–10.2)
Chloride: 103 mmol/L (ref 96–106)
Creatinine, Ser: 0.76 mg/dL (ref 0.57–1.00)
Globulin, Total: 2.2 g/dL (ref 1.5–4.5)
Glucose: 85 mg/dL (ref 70–99)
Potassium: 4.3 mmol/L (ref 3.5–5.2)
Sodium: 140 mmol/L (ref 134–144)
Total Protein: 6.4 g/dL (ref 6.0–8.5)
eGFR: 105 mL/min/{1.73_m2} (ref >59)

## 2023-01-15 LAB — LIPID PANEL
Chol/HDL Ratio: 3.1 {ratio} (ref 0.0–4.4)
Cholesterol, Total: 204 mg/dL — ABNORMAL HIGH (ref 100–199)
HDL: 66 mg/dL (ref >39)
LDL Chol Calc (NIH): 122 mg/dL — ABNORMAL HIGH (ref 0–99)
Triglycerides: 88 mg/dL (ref 0–149)
VLDL Cholesterol Cal: 16 mg/dL (ref 5–40)

## 2023-01-15 LAB — TSH: TSH: 0.881 u[IU]/mL (ref 0.450–4.500)

## 2023-01-15 LAB — HEMOGLOBIN A1C
Est. average glucose Bld gHb Est-mCnc: 117 mg/dL
Hgb A1c MFr Bld: 5.7 % — ABNORMAL HIGH (ref 4.8–5.6)

## 2023-01-19 ENCOUNTER — Telehealth: Payer: Self-pay | Admitting: Nurse Practitioner

## 2023-01-19 NOTE — Telephone Encounter (Signed)
Pt called stating she received her flu shot during ov with [facility] on 01/14/23. Pt states her employer requires proof of the vaccine in order for her to continue work. Pt states she showed her AVS, that states she received the shot, but it wasn't valid enough for her employer. Pt asked if a letter or some type of proof, be given to her saying she received the shot? Pt requested for ppw be upload on her mychart, if not she'll pick letter up from office. Call back # [phone]

## 2023-01-19 NOTE — Telephone Encounter (Signed)
Contacted pt via my chart message.

## 2023-01-20 ENCOUNTER — Other Ambulatory Visit: Payer: Managed Care, Other (non HMO)

## 2023-01-20 ENCOUNTER — Ambulatory Visit: Payer: Managed Care, Other (non HMO)

## 2023-01-20 DIAGNOSIS — Z719 Counseling, unspecified: Secondary | ICD-10-CM

## 2023-01-20 NOTE — Progress Notes (Signed)
In nurse clinic for immunization appt, but actually needed PPD placement. PPD could not be placed today, as results would need to be read in 3 days, having that fall over the weekend. Patient scheduled for PPD placement 01/21/23 at 1PM.   Abagail Kitchens, RN

## 2023-01-21 ENCOUNTER — Ambulatory Visit (LOCAL_COMMUNITY_HEALTH_CENTER): Payer: Self-pay

## 2023-01-21 DIAGNOSIS — Z111 Encounter for screening for respiratory tuberculosis: Secondary | ICD-10-CM

## 2023-01-24 ENCOUNTER — Ambulatory Visit: Payer: Managed Care, Other (non HMO)

## 2023-01-24 DIAGNOSIS — Z111 Encounter for screening for respiratory tuberculosis: Secondary | ICD-10-CM

## 2023-01-24 LAB — TB SKIN TEST
Induration: 0 mm
TB Skin Test: NEGATIVE

## 2023-01-31 ENCOUNTER — Telehealth: Payer: Self-pay | Admitting: Nurse Practitioner

## 2023-01-31 NOTE — Telephone Encounter (Signed)
Left voicemail for patient to call the office back.   

## 2023-01-31 NOTE — Telephone Encounter (Signed)
Reason for CRM: PT would like to request a callback regarding her paperwork for her flu shot. She stated that her pcp put a note in for her, however her job is rejecting her paperwork due to her name not being attached to it, she asked please attached the paperwork again and add her name.

## 2023-02-01 NOTE — Telephone Encounter (Signed)
Updated flu information for pt via mychart.

## 2023-02-01 NOTE — Telephone Encounter (Signed)
Copied from CRM [id number]. Topic: General - Other >> Feb 01, 2023  9:26 AM Melissa C wrote: Reason for CRM: As per stated in previous CRM- patient needs nurses to attach her name to her MyChart information. She stated she got a call back from a nurse but missed the call. She said if you are able to take care of it without calling you may do so but if you need to contact her she will try and make sure to take your call this time.

## 2023-02-04 ENCOUNTER — Ambulatory Visit: Payer: Managed Care, Other (non HMO)

## 2023-02-04 DIAGNOSIS — Z111 Encounter for screening for respiratory tuberculosis: Secondary | ICD-10-CM

## 2023-02-07 ENCOUNTER — Ambulatory Visit: Payer: Managed Care, Other (non HMO)

## 2023-02-07 ENCOUNTER — Other Ambulatory Visit: Payer: Self-pay | Admitting: Obstetrics and Gynecology

## 2023-02-07 DIAGNOSIS — Z111 Encounter for screening for respiratory tuberculosis: Secondary | ICD-10-CM

## 2023-02-07 DIAGNOSIS — Z319 Encounter for procreative management, unspecified: Secondary | ICD-10-CM

## 2023-02-07 LAB — TB SKIN TEST
Induration: 0 mm
TB Skin Test: NEGATIVE

## 2023-02-10 ENCOUNTER — Ambulatory Visit
Admission: RE | Admit: 2023-02-10 | Discharge: 2023-02-10 | Disposition: A | Discharge status: Home / Self Care | Payer: Managed Care, Other (non HMO) | Source: Ambulatory Visit | Attending: Obstetrics and Gynecology | Admitting: Obstetrics and Gynecology

## 2023-02-10 DIAGNOSIS — Z319 Encounter for procreative management, unspecified: Secondary | ICD-10-CM | POA: Diagnosis not present

## 2023-02-10 DIAGNOSIS — N979 Female infertility, unspecified: Secondary | ICD-10-CM | POA: Diagnosis present

## 2023-02-10 MED ORDER — IOHEXOL 300 MG/ML  SOLN
50.0000 mL | Freq: Once | INTRAMUSCULAR | Status: AC | PRN
Start: 2023-02-10 — End: 2023-02-10
  Administered 2023-02-10: 20 mL

## 2023-02-10 NOTE — Procedures (Signed)
 Consent reviewed and signed. Time out performed.  Prep and drape done.  Cervix normal.  No tenaculum placed. Cervix not dilated as part of procedure.  Catheter placed into uterine cavity. Approximately 10 mL dye injected under fluoroscopic visualization.  Patent bilateral fallopian tubes noted with normal uterine contour.  Catheter removed.  Patient stable, tolerated procedure well.  Female chaperone present during procedure.  Garnette Mace, MD, [facility] 02/10/2023 12:34 PM

## 2023-02-18 ENCOUNTER — Ambulatory Visit: Payer: Managed Care, Other (non HMO) | Admitting: Nurse Practitioner

## 2023-05-02 ENCOUNTER — Telehealth: Payer: Self-pay

## 2023-05-02 NOTE — Telephone Encounter (Signed)
 We can schedule her a virtual visit

## 2023-05-02 NOTE — Telephone Encounter (Signed)
 Copied from CRM [id number]. Topic: Clinical - Medication Question >> May 02, 2023  9:22 AM Adele Barthel wrote: Reason for CRM:   Last saw patient in 01/2023 concerning weight loss. Was advised to start Ozempic and blood work was performed. Patient decided to start with phentermine first-however, has not been beneficial.  Would like to know if she needs to make an appt to speak with provider, because she has decided to try the Ozempic, or if he can look at her bloodwork and prescribe the medication based of their past visit.   CB#   [phone] >> May 02, 2023  9:46 AM CMA Vicente Males C wrote: Wrong office

## 2023-05-02 NOTE — Telephone Encounter (Signed)
 Spoke to pt, scheduled virtual visit for 3/27

## 2023-05-05 ENCOUNTER — Telehealth (INDEPENDENT_AMBULATORY_CARE_PROVIDER_SITE_OTHER): Admitting: Nurse Practitioner

## 2023-05-05 VITALS — Ht 62.0 in

## 2023-05-05 DIAGNOSIS — R7303 Prediabetes: Secondary | ICD-10-CM | POA: Diagnosis not present

## 2023-05-05 DIAGNOSIS — Z6837 Body mass index (BMI) 37.0-37.9, adult: Secondary | ICD-10-CM | POA: Diagnosis not present

## 2023-05-05 DIAGNOSIS — E7800 Pure hypercholesterolemia, unspecified: Secondary | ICD-10-CM | POA: Diagnosis not present

## 2023-05-05 DIAGNOSIS — E669 Obesity, unspecified: Secondary | ICD-10-CM | POA: Diagnosis not present

## 2023-05-05 NOTE — Assessment & Plan Note (Signed)
 Patient had phentermine in the past with some success but then gained weight back.  She has not tried fertility treatments and lumbar is interested in GLP-1 medication for weight loss.  Patient will check with her insurance to see if Wegovy or Zepbound is covered.  Patient does not have any family or personal history of medullary thyroid cancer or multiple endocrine neoplasia syndrome type II.  Follow-up 3 months after starting injection

## 2023-05-05 NOTE — Progress Notes (Signed)
 Ph: [phone] Fax: [phone]   Patient ID: Regina Chambers, female    DOB: 28-Jul-1990, 33 y.o.   MRN: [id number]  Virtual visit completed through MyChart, a video enabled telemedicine application. Due to national recommendations of social distancing due to COVID-19, a virtual visit is felt to be most appropriate for this patient at this time. Reviewed limitations, risks, security and privacy concerns of performing a virtual visit and the availability of in person appointments. I also reviewed that there may be a patient responsible charge related to this service. The patient agreed to proceed.   Patient location: home Provider location: [place] at [facility], office Persons participating in this virtual visit: patient, provider   If any vitals were documented, they were collected by patient at home unless specified below.    Ht 5\' 2"  (1.575 m) Comment: per chart  LMP 04/13/2023 (Exact Date)   BMI 37.46 kg/m    CC: Weight loss Subjective:   HPI: Regina Chambers is a 33 y.o. female presenting on 05/05/2023 for Medication Management (Pt would like to discuss weight loss treatment and medication. )    We have discussed weight loss in the past. She was against the injections in the beginning. States that she did phentermine in the past and last approx 8 pounds and then gained it back.States that she has stopped doing the fertility treatments  She is eating smaller meals throughout the day. She will snack all day. She does drink coffee water no soda or tea  She is doing to the gym twice a week for . 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    Relevant past medical, surgical, family and social history reviewed and updated as indicated. Interim medical history since our last visit reviewed. Allergies and medications reviewed and updated. Outpatient Medications Prior to Visit  Medication Sig Dispense Refill   levonorgestrel (MIRENA) 20  MCG/24HR IUD 1 each by Intrauterine route once. Inserted 10/2014 (Patient not taking: Reported on 05/05/2023)     phentermine (ADIPEX-P) 37.5 MG tablet Take 1 tablet (37.5 mg total) by mouth daily before breakfast. (Patient not taking: Reported on 05/05/2023) 30 tablet 0   valACYclovir (VALTREX) 500 MG tablet Take 1 tablet (500 mg total) by mouth daily. (Patient not taking: Reported on 05/05/2023) 30 tablet 11   No facility-administered medications prior to visit.     Per HPI unless specifically indicated in ROS section below Review of Systems  Constitutional:  Negative for chills and fever.  Respiratory:  Negative for shortness of breath.   Cardiovascular:  Negative for chest pain.   Objective:  Ht 5\' 2"  (1.575 m) Comment: per chart  LMP 04/13/2023 (Exact Date)   BMI 37.46 kg/m   Wt Readings from Last 3 Encounters:  01/14/23 204 lb 12.8 oz (92.9 kg)  06/08/22 194 lb (88 kg)  10/14/21 193 lb (87.5 kg)       Physical exam: Gen: alert, NAD, not ill appearing Pulm: speaks in complete sentences without increased work of breathing Psych: normal mood, normal thought content      Results for orders placed or performed in visit on 02/04/23  TB Skin Test   Collection Time: 02/07/23  1:22 PM  Result Value Ref Range   TB Skin Test Negative    Induration 0 mm   Assessment & Plan:   Obesity (BMI 30-39.9) Assessment & Plan: Patient had phentermine in the past with some success but then gained weight back.  She  has not tried fertility treatments and lumbar is interested in GLP-1 medication for weight loss.  Patient will check with her insurance to see if Wegovy or Zepbound is covered.  Patient does not have any family or personal history of medullary thyroid cancer or multiple endocrine neoplasia syndrome type II.  Follow-up 3 months after starting injection   Prediabetes  Elevated LDL cholesterol level     I discussed the assessment and treatment plan with the patient. The patient  was provided an opportunity to ask questions and all were answered. The patient agreed with the plan and demonstrated an understanding of the instructions. The patient was advised to call back or seek an in-person evaluation if the symptoms worsen or if the condition fails to improve as anticipated.  Follow up plan: Return in about 3 months (around 08/05/2023) for med/weight recheck .  Audria Nine, NP

## 2023-05-05 NOTE — Patient Instructions (Addendum)
 Zepbound and wegovy are the two injectable weight loss medications  Call your insurance and let me know which is covered Continue working on the diet and exercise

## 2023-06-01 ENCOUNTER — Telehealth

## 2023-06-01 ENCOUNTER — Other Ambulatory Visit

## 2023-06-01 DIAGNOSIS — Z3480 Encounter for supervision of other normal pregnancy, unspecified trimester: Secondary | ICD-10-CM | POA: Insufficient documentation

## 2023-06-01 DIAGNOSIS — Z8759 Personal history of other complications of pregnancy, childbirth and the puerperium: Secondary | ICD-10-CM | POA: Insufficient documentation

## 2023-06-01 DIAGNOSIS — Z3687 Encounter for antenatal screening for uncertain dates: Secondary | ICD-10-CM

## 2023-06-01 DIAGNOSIS — Z3689 Encounter for other specified antenatal screening: Secondary | ICD-10-CM

## 2023-06-01 NOTE — Progress Notes (Signed)
 New OB Intake  I connected with  Regina Chambers on 06/01/23 at  9:15 AM EDT by Video Visit and verified that I am speaking with the correct person using two identifiers. Nurse is located at [facility] and pt is located at home.  I discussed the limitations, risks, security and privacy concerns of performing an evaluation and management service by telephone and the availability of in person appointments. I also discussed with the patient that there may be a patient responsible charge related to this service. The patient expressed understanding and agreed to proceed.  I explained I am completing New OB Intake today. We discussed her EDD of 01/14/24 that is based on LMP of 04/09/23. Pt is G3/P1. I reviewed her allergies, medications, Medical/Surgical/OB history, and appropriate screenings. There are no cats in the home. Based on history, this is a/an pregnancy uncomplicated . Her obstetrical history is significant for  N/A .  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  Delivery Plans:  Plans to deliver at [facility].  Anatomy US  Explained first scheduled US  will be on 06/22/23. Anatomy US  will be done at 20 weeks of gestational  age.  Labs Discussed genetic screening with patient. Patient desires genetic testing to be drawn at new OB visit. Discussed possible labs to be drawn at new OB appointment.  COVID Vaccine Patient has had 2 COVID vaccines.   Social Determinants of Health Food Insecurity: denies food insecurity  Transportation: Patient denies transportation needs.  First visit review I reviewed new OB appt with pt. I explained she will have blood work and pap smear/pelvic exam if indicated. Explained pt will be seen by Sofia Dunn, DO at first visit; encounter routed to appropriate provider.   Higinio Love, [facility] 06/01/2023  10:15 AM

## 2023-06-01 NOTE — Patient Instructions (Signed)
 First Trimester of Pregnancy  The first trimester of pregnancy starts on the first day of your last monthly period until the end of week 13. This is months 1 through 3 of pregnancy. A week after a sperm fertilizes an egg, the egg will implant into the wall of the uterus and begin to develop into a baby. Body changes during your first trimester Your body goes through many changes during pregnancy. The changes usually return to normal after your baby is born. Physical changes Your breasts may grow larger and may hurt. The area around your nipples may get darker. Your periods will stop. Your hair and nails may grow faster. You may pee more often. Health changes You may tire easily. Your gums may bleed and may be sensitive when you brush and floss. You may not feel hungry. You may have heartburn. You may throw up or feel like you may throw up. You may want to eat some foods, but not others. You may have headaches. You may have trouble pooping (constipation). Other changes Your emotions may change from day to day. You may have more dreams. Follow these instructions at home: Medicines Talk to your health care provider if you're taking medicines. Ask if the medicines are safe to take during pregnancy. Your provider may change the medicines that you take. Do not take any medicines unless told to by your provider. Take a prenatal vitamin that has at least 600 micrograms (mcg) of folic acid. Do not use herbal medicines, illegal substances, or medicines that are not approved by your provider. Eating and drinking While you're pregnant your body needs extra food for your growing baby. Talk with your provider about what to eat while pregnant. Activity Most women are able to exercise during pregnancy. Exercises may need to change as your pregnancy goes on. Talk to your provider about your activities and exercise routines. Relieving pain and discomfort Wear a good, supportive bra if your breasts  hurt. Rest with your legs raised if you have leg cramps or low back pain. Safety Wear your seatbelt at all times when you're in a car. Talk to your provider if someone hits you, hurts you, or yells at you. Talk with your provider if you're feeling sad or have thoughts of hurting yourself. Lifestyle Certain things can be harmful while you're pregnant. Follow these rules: Do not use hot tubs, steam rooms, or saunas. Do not douche. Do not use tampons or scented pads. Do not drink alcohol,smoke, vape, or use products with nicotine or tobacco in them. If you need help quitting, talk with your provider. Avoid cat litter boxes and soil used by cats. These things carry germs that can cause harm to your pregnancy and your baby. General instructions Keep all follow-up visits. It helps you and your unborn baby stay as healthy as possible. Write down your questions. Take them to your visits. Your provider will: Talk with you about your overall health. Give you advice or refer you to specialists who can help with different needs, including: Prenatal education classes. Mental health and counseling. Foods and healthy eating. Ask for help if you need help with food. Call your dentist and ask to be seen. Brush your teeth with a soft toothbrush. Floss gently. Where to find more information American Pregnancy Association: americanpregnancy.org [employer] of [employer]stetricians and Gynecologists: acog.org Office on [employer] Health: TravelLesson.ca Contact a health care provider if: You feel dizzy, faint, or have a fever. You vomit or have watery poop (diarrhea) for 2  days or more. You have abnormal discharge or bleeding from your vagina. You have pain when you pee or your pee smells bad. You have cramps, pain, or pressure in your belly area. Get help right away if: You have trouble breathing or chest pain. You have any kind of injury, such as from a fall or a car crash. These symptoms may be an  emergency. Get help right away. Call 911. Do not wait to see if the symptoms will go away. Do not drive yourself to the hospital. This information is not intended to replace advice given to you by your health care provider. Make sure you discuss any questions you have with your health care provider. Document Revised: 10/28/2022 Document Reviewed: 05/28/2022 Elsevier Patient Education  2024 Elsevier Inc.Commonly Asked Questions During Pregnancy  Cats: A parasite can be excreted in cat feces.  To avoid exposure you need to have another person empty the little box.  If you must empty the litter box you will need to wear gloves.  Wash your hands after handling your cat.  This parasite can also be found in raw or undercooked meat so this should also be avoided.  Colds, Sore Throats, Flu: Please check your medication sheet to see what you can take for symptoms.  If your symptoms are unrelieved by these medications please call the office.  Dental Work: Most any dental work Agricultural consultant recommends is permitted.  X-rays should only be taken during the first trimester if absolutely necessary.  Your abdomen should be shielded with a lead apron during all x-rays.  Please notify your provider prior to receiving any x-rays.  Novocaine is fine; gas is not recommended.  If your dentist requires a note from [place] prior to dental work please call the office and we will provide one for you.  Exercise: Exercise is an important part of staying healthy during your pregnancy.  You may continue most exercises you were accustomed to prior to pregnancy.  Later in your pregnancy you will most likely notice you have difficulty with activities requiring balance like riding a bicycle.  It is important that you listen to your body and avoid activities that put you at a higher risk of falling.  Adequate rest and staying well hydrated are a must!  If you have questions about the safety of specific activities ask your provider.     Exposure to Children with illness: Try to avoid obvious exposure; report any symptoms to [place] when noted,  If you have chicken pos, red measles or mumps, you should be immune to these diseases.   Please do not take any vaccines while pregnant unless you have checked with your OB provider.  Fetal Movement: After 28 weeks we recommend you do "kick counts" twice daily.  Lie or sit down in a calm quiet environment and count your baby movements "kicks".  You should feel your baby at least 10 times per hour.  If you have not felt 10 kicks within the first hour get up, walk around and have something sweet to eat or drink then repeat for an additional hour.  If count remains less than 10 per hour notify your provider.  Fumigating: Follow your pest control agent's advice as to how long to stay out of your home.  Ventilate the area well before re-entering.  Hemorrhoids:   Most over-the-counter preparations can be used during pregnancy.  Check your medication to see what is safe to use.  It is important to use a  stool softener or fiber in your diet and to drink lots of liquids.  If hemorrhoids seem to be getting worse please call the office.   Hot Tubs:  Hot tubs Jacuzzis and saunas are not recommended while pregnant.  These increase your internal body temperature and should be avoided.  Intercourse:  Sexual intercourse is safe during pregnancy as long as you are comfortable, unless otherwise advised by your provider.  Spotting may occur after intercourse; report any bright red bleeding that is heavier than spotting.  Labor:  If you know that you are in labor, please go to the hospital.  If you are unsure, please call the office and let us help you decide what to do.  Lifting, straining, etc:  If your job requires heavy lifting or straining please check with your provider for any limitations.  Generally, you should not lift items heavier than that you can lift simply with your hands and arms (no back  muscles)  Painting:  Paint fumes do not harm your pregnancy, but may make you ill and should be avoided if possible.  Latex or water based paints have less odor than oils.  Use adequate ventilation while painting.  Permanents & Hair Color:  Chemicals in hair dyes are not recommended as they cause increase hair dryness which can increase hair loss during pregnancy.  " Highlighting" and permanents are allowed.  Dye may be absorbed differently and permanents may not hold as well during pregnancy.  Sunbathing:  Use a sunscreen, as skin burns easily during pregnancy.  Drink plenty of fluids; avoid over heating.  Tanning Beds:  Because their possible side effects are still unknown, tanning beds are not recommended.  Ultrasound Scans:  Routine ultrasounds are performed at approximately 20 weeks.  You will be able to see your baby's general anatomy an if you would like to know the gender this can usually be determined as well.  If it is questionable when you conceived you may also receive an ultrasound early in your pregnancy for dating purposes.  Otherwise ultrasound exams are not routinely performed unless there is a medical necessity.  Although you can request a scan we ask that you pay for it when conducted because insurance does not cover " patient request" scans.  Work: If your pregnancy proceeds without complications you may work until your due date, unless your physician or employer advises otherwise.  Round Ligament Pain/Pelvic Discomfort:  Sharp, shooting pains not associated with bleeding are fairly common, usually occurring in the second trimester of pregnancy.  They tend to be worse when standing up or when you remain standing for long periods of time.  These are the result of pressure of certain pelvic ligaments called "round ligaments".  Rest, Tylenol and heat seem to be the most effective relief.  As the womb and fetus grow, they rise out of the pelvis and the discomfort improves.  Please  notify the office if your pain seems different than that described.  It may represent a more serious condition.  Common Medications Safe in Pregnancy  Acne:      Constipation:  Benzoyl Peroxide     Colace  Clindamycin      Dulcolax Suppository  Topica Erythromycin     Fibercon  Salicylic Acid      Metamucil         Miralax AVOID:        Senakot   Accutane    Cough:  Retin-A       Cough  Drops  Tetracycline      Phenergan w/ Codeine if Rx  Minocycline      Robitussin (Plain & DM)  Antibiotics:     Crabs/Lice:  Ceclor       RID  Cephalosporins    AVOID:  E-Mycins      Kwell  Keflex  Macrobid/Macrodantin   Diarrhea:  Penicillin      Kao-Pectate  Zithromax      Imodium AD         PUSH FLUIDS AVOID:       Cipro     Fever:  Tetracycline      Tylenol (Regular or Extra  Minocycline       Strength)  Levaquin      Extra Strength-Do not          Exceed 8 tabs/24 hrs Caffeine:        200mg /day (equiv. To 1 cup of coffee or  approx. 3 12 oz sodas)         Gas: Cold/Hayfever:       Gas-X  Benadryl      Mylicon  Claritin       Phazyme  **Claritin-D        Chlor-Trimeton    Headaches:  Dimetapp      ASA-Free Excedrin  Drixoral-Non-Drowsy     Cold Compress  Mucinex (Guaifenasin)     Tylenol (Regular or Extra  Sudafed/Sudafed-12 Hour     Strength)  **Sudafed PE Pseudoephedrine   Tylenol Cold & Sinus     Vicks Vapor Rub  Zyrtec  **AVOID if Problems With Blood Pressure         Heartburn: Avoid lying down for at least 1 hour after meals  Aciphex      Maalox     Rash:  Milk of Magnesia     Benadryl    Mylanta       1% Hydrocortisone Cream  Pepcid  Pepcid Complete   Sleep Aids:  Prevacid      Ambien   Prilosec       Benadryl  Rolaids       Chamomile Tea  Tums (Limit 4/day)     Unisom         Tylenol PM         Warm milk-add vanilla or  Hemorrhoids:       Sugar for taste  Anusol/Anusol H.C.  (RX: Analapram 2.5%)  Sugar Substitutes:  Hydrocortisone OTC     Ok in  moderation  Preparation H      Tucks        Vaseline lotion applied to tissue with wiping    Herpes:     Throat:  Acyclovir      Oragel  Famvir  Valtrex     Vaccines:         Flu Shot Leg Cramps:       *Gardasil  Benadryl      Hepatitis A         Hepatitis B Nasal Spray:       Pneumovax  Saline Nasal Spray     Polio Booster         Tetanus Nausea:       Tuberculosis test or PPD  Vitamin B6 25 mg TID   AVOID:    Dramamine      *Gardasil  Emetrol       Live Poliovirus  Ginger Root 250 mg QID    MMR (measles, mumps &  High  Complex Carbs @ Bedtime    rebella)  Sea Bands-Accupressure    Varicella (Chickenpox)  Unisom 1/2 tab TID     *No known complications           If received before Pain:         Known pregnancy;   Darvocet       Resume series after  Lortab        Delivery  Percocet    Yeast:   Tramadol      Femstat  Tylenol 3      Gyne-lotrimin  Ultram       Monistat  Vicodin           MISC:         All Sunscreens           Hair Coloring/highlights          Insect Repellant's          (Including DEET)         Mystic Tans

## 2023-06-02 LAB — BETA HCG QUANT (REF LAB): hCG Quant: 72185 m[IU]/mL

## 2023-06-03 ENCOUNTER — Telehealth

## 2023-06-13 DIAGNOSIS — O0992 Supervision of high risk pregnancy, unspecified, second trimester: Secondary | ICD-10-CM | POA: Insufficient documentation

## 2023-06-22 ENCOUNTER — Ambulatory Visit

## 2023-06-22 ENCOUNTER — Other Ambulatory Visit

## 2023-06-23 LAB — OB RESULTS CONSOLE RUBELLA ANTIBODY, IGM: Rubella: IMMUNE

## 2023-06-23 LAB — OB RESULTS CONSOLE VARICELLA ZOSTER ANTIBODY, IGG: Varicella: IMMUNE

## 2023-07-05 ENCOUNTER — Telehealth: Payer: Self-pay | Admitting: Obstetrics

## 2023-07-05 ENCOUNTER — Encounter: Admitting: Obstetrics

## 2023-07-05 NOTE — Telephone Encounter (Signed)
 Reached out to pt to reschedule NOB appt that was scheduled on 07/05/2023 at 2:15 with Dr. Dell Fennel.  Left message for pt to call back to reschedule.

## 2023-07-05 NOTE — Progress Notes (Deleted)
 OBSTETRIC INITIAL PRENATAL VISIT  Subjective:    Regina Chambers is being seen today for her first obstetrical visit.  This {is/is not:9024} a planned pregnancy. She is a 33 y.o. G19P1011 female at [redacted]w[redacted]d gestation, Estimated Date of Delivery: 01/14/24 with Patient's last menstrual period was 04/09/2023 (exact date).,  consistent with *** week sono. Her obstetrical history is significant for {ob risk factors:10154}. Relationship with FOB: {fob:16621}. Patient {does/does not:19097} intend to breast feed. Pregnancy history fully reviewed.    OB History  Gravida Para Term Preterm AB Living  3 1 1  0 1 1  SAB IAB Ectopic Multiple Live Births  1 0 0 0 1    # Outcome Date GA Lbr Len/2nd Weight Sex Type Anes PTL Lv  3 Current           2 Term 09/02/14 [redacted]w[redacted]d  6 lb 6.3 oz (2.9 kg) F CS-LVertical EPI  LIV     Name: Regina Chambers     Apgar1: 7  Apgar5: 9  1 SAB 10/09/13 [redacted]w[redacted]d    SAB   FD     Complications: Cervical insufficiency in pregnancy, antepartum    Gynecologic History:  Last pap smear was ***.  Results were {Normal/Abnormal:304960160}.  {Actions; denies-reports:120008} h/o abnormal pap smears in the past.  {Actions; denies-reports:120008} history of STIs.  Contraception prior to conception:    Past Medical History:  Diagnosis Date   Anxiety    Blood type, Rh positive    Cervical insufficiency in pregnancy, antepartum    prior pregnancy   Genital herpes    GERD (gastroesophageal reflux disease)    Obesity    Polycystic disease, ovaries     Family History  Problem Relation Age of Onset   Heart disease Father    Kidney disease Father    Hypertension Father    Diabetes Paternal Grandfather    Hypertension Paternal Grandfather     Past Surgical History:  Procedure Laterality Date   CERVICAL CERCLAGE  2016   CESAREAN SECTION N/A 09/02/2014   Procedure: CESAREAN SECTION;  Surgeon: Darl Edu, MD;  Location: [facility] OR[facility];  Service: Obstetrics;  Laterality: N/A;    CHOLECYSTECTOMY N/A 05/02/2015   Procedure: LAPAROSCOPIC CHOLECYSTECTOMY WITH INTRAOPERATIVE CHOLANGIOGRAM;  Surgeon: Benancio Bracket, MD;  Location: [facility] OR[facility];  Service: General;  Laterality: N/A;   INTRAUTERINE DEVICE (IUD) INSERTION  10/29/2014   [place]   LAPAROSCOPIC GASTRIC SLEEVE RESECTION N/A 04/25/2018   Procedure: LAPAROSCOPIC GASTRIC SLEEVE RESECTION, UPPER ENDO, ERAS,  HIATAL HERNIA REPAIR;  Surgeon: Aldean Hummingbird, MD;  Location: [facility];  Service: General;  Laterality: N/A;   UPPER GI ENDOSCOPY  04/25/2018   Procedure: UPPER GI ENDOSCOPY;  Surgeon: Aldean Hummingbird, MD;  Location: [facility];  Service: General;;    Social History   Socioeconomic History   Marital status: Significant Other    Spouse name: Not on file   Number of children: 1   Years of education: Not on file   Highest education level: Associate degree: academic program  Occupational History   Not on file  Tobacco Use   Smoking status: Never   Smokeless tobacco: Never  Vaping Use   Vaping status: Never Used  Substance and Sexual Activity   Alcohol use: Not Currently   Drug use: No   Sexual activity: Yes    Partners: Male    Birth control/protection: None  Other Topics Concern   Not on file  Social History Narrative   Fulltime: [employer]  Social Drivers of Corporate investment banker Strain: Low Risk  (06/23/2023)   Received from [facility]   Overall Financial Resource Strain (CARDIA)    Difficulty of Paying Living Expenses: Not hard at all  Food Insecurity: No Food Insecurity (06/23/2023)   Received from [facility]   Hunger Vital Sign    Worried About Running Out of Food in the Last Year: Never true    Ran Out of Food in the Last Year: Never true  Transportation Needs: No Transportation Needs (06/23/2023)   Received from [facility] - Transportation    In the past 12 months, has lack of transportation kept you from medical  appointments or from getting medications?: No    Lack of Transportation (Non-Medical): No  Physical Activity: Insufficiently Active (06/01/2023)   Exercise Vital Sign    Days of Exercise per Week: 2 days    Minutes of Exercise per Session: 20 min  Stress: Stress Concern Present (06/01/2023)   [employer] of Occupational Health - Occupational Stress Questionnaire    Feeling of Stress : To some extent  Social Connections: Moderately Integrated (06/01/2023)   Social Connection and Isolation Panel [NHANES]    Frequency of Communication with Friends and Family: More than three times a week    Frequency of Social Gatherings with Friends and Family: Once a week    Attends Religious Services: More than 4 times per year    Active Member of [employer] or Organizations: No    Attends Banker Meetings: Never    Marital Status: Living with partner  Intimate Partner Violence: Not At Risk (06/01/2023)   Humiliation, Afraid, Rape, and Kick questionnaire    Fear of Current or Ex-Partner: No    Emotionally Abused: No    Physically Abused: No    Sexually Abused: No    No current outpatient medications on file prior to visit.   No current facility-administered medications on file prior to visit.    No Known Allergies   Review of Systems General: Not Present- Fever, Weight Loss and Weight Gain. Skin: Not Present- Rash. HEENT: Not Present- Blurred Vision, Headache and Bleeding Gums. Respiratory: Not Present- Difficulty Breathing. Breast: Not Present- Breast Mass. Cardiovascular: Not Present- Chest Pain, Elevated Blood Pressure, Fainting / Blacking Out and Shortness of Breath. Gastrointestinal: Not Present- Abdominal Pain, Constipation, Nausea and Vomiting. Female Genitourinary: Not Present- Frequency, Painful Urination, Pelvic Pain, Vaginal Bleeding, Vaginal Discharge, Contractions, regular, Fetal Movements Decreased, Urinary Complaints and Vaginal Fluid. Musculoskeletal: Not Present-  Back Pain and Leg Cramps. Neurological: Not Present- Dizziness. Psychiatric: Not Present- Depression.     Objective:   Last menstrual period 04/09/2023.  There is no height or weight on file to calculate BMI.  General Appearance:    Alert, cooperative, no distress, appears stated age  Head:    Normocephalic, without obvious abnormality, atraumatic  Eyes:    PERRL, conjunctiva/corneas clear, EOM's intact, both eyes  Ears:    Normal external ear canals, both ears  Nose:   Nares normal, septum midline, mucosa normal, no drainage or sinus tenderness  Throat:   Lips, mucosa, and tongue normal; teeth and gums normal  Neck:   Supple, symmetrical, trachea midline, no adenopathy; thyroid: no enlargement/tenderness/nodules; no carotid bruit or JVD  Back:     Symmetric, no curvature, ROM normal, no CVA tenderness  Lungs:     Clear to auscultation bilaterally, respirations unlabored  Chest Wall:  No tenderness or deformity   Heart:    Regular rate and rhythm, S1 and S2 normal, no murmur, rub or gallop  Breast Exam:    No tenderness, masses, or nipple abnormality  Abdomen:     Soft, non-tender, bowel sounds active all four quadrants, no masses, no organomegaly.  FHT ***  bpm.  Genitalia:    Pelvic:external genitalia normal, vagina without lesions, discharge, or tenderness, rectovaginal septum  normal. Cervix normal in appearance, no cervical motion tenderness, no adnexal masses or tenderness.  Pregnancy positive findings: uterine enlargement: *** wk size, nontender.   Rectal:    Normal external sphincter.  No hemorrhoids appreciated. Internal exam not done.   Extremities:   Extremities normal, atraumatic, no cyanosis or edema  Pulses:   2+ and symmetric all extremities  Skin:   Skin color, texture, turgor normal, no rashes or lesions  Lymph nodes:   Cervical, supraclavicular, and axillary nodes normal  Neurologic:   CNII-XII intact, normal strength, sensation and reflexes throughout      Assessment:   1. Initial obstetric visit in first trimester   2. Routine screening for STI (sexually transmitted infection)     Plan:   Supervision of ***normal/high risk pregnancy  - Initial labs reviewed. - Prenatal vitamins encouraged. - Problem list reviewed and updated. - New OB counseling:  The patient has been given an overview regarding routine prenatal care.  Recommendations regarding diet, weight gain, and exercise in pregnancy were given. - Prenatal testing, optional genetic testing, and ultrasound use in pregnancy were reviewed.  Traditional genetic screening vs cell-fee DNA genetic screening discussed, including risks and benefits. Testing {requests/ordered/declines:14581}. - Benefits of Breast Feeding were discussed. The patient is encouraged to consider nursing her baby post partum.  1. Initial obstetric visit in first trimester (Primary) ***  2. Routine screening for STI (sexually transmitted infection) ***     Follow up in 4 weeks.    Rian Chafe, CMA [facility]

## 2023-07-06 ENCOUNTER — Encounter: Payer: Self-pay | Admitting: Obstetrics

## 2023-07-06 NOTE — Telephone Encounter (Signed)
 Reached out to pt (2x) to reschedule NOB appt that was scheduled on 07/05/2023 at 2:15 with Dr. Dell Fennel.  Left message for pt to call back to reschedule.  Will send a MyChart letter to pt.

## 2023-09-24 LAB — OB RESULTS CONSOLE RPR: RPR: NONREACTIVE

## 2023-11-20 ENCOUNTER — Other Ambulatory Visit: Payer: Self-pay

## 2023-11-20 ENCOUNTER — Observation Stay: Admission: EM | Admit: 2023-11-20 | Discharge: 2023-11-20 | Disposition: A | Discharge status: Home / Self Care

## 2023-11-20 DIAGNOSIS — O368130 Decreased fetal movements, third trimester, not applicable or unspecified: Principal | ICD-10-CM | POA: Insufficient documentation

## 2023-11-20 DIAGNOSIS — Z3A32 32 weeks gestation of pregnancy: Secondary | ICD-10-CM | POA: Insufficient documentation

## 2023-11-20 DIAGNOSIS — Z7982 Long term (current) use of aspirin: Secondary | ICD-10-CM | POA: Insufficient documentation

## 2023-11-20 DIAGNOSIS — O368190 Decreased fetal movements, unspecified trimester, not applicable or unspecified: Secondary | ICD-10-CM | POA: Diagnosis present

## 2023-11-20 NOTE — Discharge Summary (Signed)
 Patient ID: Regina Chambers MRN: [id number] DOB/AGE: May 28, 1990 33 y.o.  Admit date: 11/20/2023 Discharge date: 11/20/2023  Admission Diagnoses: 33yo G3P1 at [redacted]w[redacted]d presents with concerns that baby has not been moving as much as usual.  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  Treatments: none  Hospital Course:  This is a 33 y.o. G3P1011 with IUP at [redacted]w[redacted]d seen for DFM.  NST was reactive and patient was reassured by the monitoring.  She was observed, fetal heart rate monitoring remained reassuring, and she had no signs/symptoms of labor or other maternal-fetal concerns.  She was deemed stable for discharge to home with outpatient follow up.  Discharge Physical Exam:  BP 102/63   Pulse 99   Temp 98.5 F (36.9 C) (Oral)   Resp 15   Ht 5' 2 (1.575 m)   Wt 102.1 kg   LMP 04/09/2023 (Exact Date)   BMI 41.15 kg/m   NST: FHR baseline: 150 bpm Variability: moderate Accelerations: yes Decelerations: none Category/reactivity: reactive  TOCO: quiet SVE: deferred      Discharge Condition: Stable  Disposition: Discharge disposition: 01-Home or Self Care        Allergies as of 11/20/2023   No Known Allergies      Medication List     TAKE these medications    aspirin 81 MG chewable tablet Chew 81 mg by mouth daily.   prenatal multivitamin Tabs tablet Take 1 tablet by mouth daily at 12 noon.        Follow-up Information     [facility] Follow up.   Why: Keep all scheduled appointments Contact information: [address] [place]  [address] [phone]                 Signed:  DELON COE, CNM 11/20/2023 12:23 PM

## 2023-11-20 NOTE — Progress Notes (Signed)
 Discharged home. Left floor ambulatory. Compression socks given. Regina Chambers

## 2023-11-20 NOTE — OB Triage Note (Signed)
 Discharge instructions, labor precautions, and follow-up care reviewed with patient. All questions answered. Patient verbalized understanding. Discharged ambulatory off unit.

## 2023-11-20 NOTE — OB Triage Note (Signed)
 Patient is a G3P1011 at [redacted]w[redacted]d who presents to unit c/o decreased fetal movement. Denies contractions, leakage of fluid, and vaginal bleeding. External monitors applied and assessing. Initial FHT 145. Vital signs WDL.

## 2023-12-20 LAB — OB RESULTS CONSOLE GC/CHLAMYDIA
Chlamydia: NEGATIVE
Neisseria Gonorrhea: NEGATIVE

## 2023-12-27 DIAGNOSIS — O09523 Supervision of elderly multigravida, third trimester: Secondary | ICD-10-CM | POA: Diagnosis present

## 2023-12-27 LAB — OB RESULTS CONSOLE HIV ANTIBODY (ROUTINE TESTING): HIV: NONREACTIVE

## 2023-12-27 LAB — OB RESULTS CONSOLE HEPATITIS B SURFACE ANTIGEN: Hepatitis B Surface Ag: NEGATIVE

## 2024-01-04 ENCOUNTER — Other Ambulatory Visit: Payer: Self-pay

## 2024-01-04 ENCOUNTER — Observation Stay
Admission: EM | Admit: 2024-01-04 | Discharge: 2024-01-04 | Disposition: A | Discharge status: Home / Self Care | Source: Ambulatory Visit | Attending: Obstetrics and Gynecology | Admitting: Obstetrics and Gynecology

## 2024-01-04 ENCOUNTER — Observation Stay

## 2024-01-04 ENCOUNTER — Encounter
Admission: RE | Admit: 2024-01-04 | Discharge: 2024-01-04 | Disposition: A | Discharge status: Home / Self Care | Source: Ambulatory Visit | Attending: Obstetrics and Gynecology | Admitting: Obstetrics and Gynecology

## 2024-01-04 ENCOUNTER — Encounter: Payer: Self-pay | Admitting: Obstetrics and Gynecology

## 2024-01-04 VITALS — Ht 62.0 in | Wt 228.0 lb

## 2024-01-04 DIAGNOSIS — O368190 Decreased fetal movements, unspecified trimester, not applicable or unspecified: Principal | ICD-10-CM | POA: Insufficient documentation

## 2024-01-04 DIAGNOSIS — O288 Other abnormal findings on antenatal screening of mother: Principal | ICD-10-CM | POA: Diagnosis present

## 2024-01-04 DIAGNOSIS — Z3A39 39 weeks gestation of pregnancy: Secondary | ICD-10-CM

## 2024-01-04 DIAGNOSIS — Z3A00 Weeks of gestation of pregnancy not specified: Secondary | ICD-10-CM | POA: Insufficient documentation

## 2024-01-04 DIAGNOSIS — Z01818 Encounter for other preprocedural examination: Secondary | ICD-10-CM | POA: Insufficient documentation

## 2024-01-04 NOTE — Discharge Summary (Signed)
 Patient ID: SHERINE CORTESE MRN: [id number] DOB/AGE: 1990-11-12 33 y.o.  Admit date: 01/04/2024 Discharge date: 01/08/2024  Admission Diagnoses: 33yo G3P1 at [redacted]w[redacted]d presents from the office for an equivocal NST.  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  Treatments: none  Hospital Course:  This is a 33 y.o. G3P1011 with IUP at [redacted]w[redacted]d seen for an in office equivocal NST.  BPP was 8/8, with RNST she had a 10/10.  She was observed, fetal heart rate monitoring remained reassuring, and she had no signs/symptoms of labor or other maternal-fetal concerns.  She was deemed stable for discharge to home with outpatient follow up.  Discharge Physical Exam:  BP 118/78   Pulse (!) 132   Temp 98.5 F (36.9 C) (Oral)   LMP 04/09/2023 (Exact Date)   NST: FHR baseline: 150 bpm Variability: moderate Accelerations: yes Decelerations: none Category/reactivity: reactive  TOCO: quiet SVE: deferred      Discharge Condition: Stable  Disposition:  Discharge disposition: 01-Home or Self Care        Allergies as of 01/04/2024   No Known Allergies      Medication List     TAKE these medications    aspirin 81 MG chewable tablet Chew 81 mg by mouth daily.   CHOLINE PO Take 1 Dose by mouth daily.   prenatal multivitamin Tabs tablet Take 1 tablet by mouth daily at 12 noon.   valACYclovir  500 MG tablet Commonly known as: VALTREX  Take 500 mg by mouth 2 (two) times daily.         Follow-up Information     [facility] Follow up.   Why: Scheduling will call you soon to schedule you for a follow up  visit in 1 week (with u/s) Contact information: [address] [place]  [phone]                 Signed:  DELON COE, CNM 01/08/2024 9:06 PM

## 2024-01-04 NOTE — Patient Instructions (Addendum)
 Your procedure is scheduled on: TUESDAY 01/10/24  Arrival Time: Please call Labor and Delivery the day before your scheduled C-Section to find out your arrival time. [phone].   Arrival: If your arrival time is prior to 6:00 am, please enter through the Emergency Room Entrance and you will be directed to Labor and Delivery. If your arrival time is 6:00 am or later, please enter the Medical Mall and follow the greeter's instructions.  REMEMBER: Instructions that are not followed completely may result in serious medical risk, up to and including death; or upon the discretion of your surgeon and anesthesiologist your surgery may need to be rescheduled.  Do not eat food after midnight the night before surgery.  No gum chewing or hard candies.  You may however, drink CLEAR liquids up to 2 hours before you are scheduled to arrive for your surgery. Do not drink anything within 2 hours of your scheduled arrival time.  Clear liquids include: - water  - apple juice without pulp - gatorade (not RED colors) - black coffee or tea (Do NOT add milk or creamers to the coffee or tea) Do NOT drink anything that is not on this list.  One week prior to surgery: Stop Anti-inflammatories (NSAIDS) such as Advil , Aleve , Ibuprofen , Motrin , Naproxen , Naprosyn  and Aspirin based products such as Excedrin, Goody's Powder, BC Powder. Stop ANY OVER THE COUNTER supplements until after surgery.  You may however, continue to take Tylenol  if needed for pain up until the day of surgery.  Continue taking all of your other prescription medications up until the day of surgery.  ON THE DAY OF SURGERY ONLY TAKE THESE MEDICATIONS WITH SIPS OF WATER:  NONE  Use inhalers on the day of surgery and bring to the hospital.  No Alcohol for 24 hours before or after surgery.  No Smoking including e-cigarettes for 24 hours prior to surgery.  No chewable tobacco products for at least 6 hours prior to surgery.  No nicotine  patches on the day of surgery.  Do not use any recreational drugs for at least a week prior to your surgery.  Please be advised that the combination of cocaine and anesthesia may have negative outcomes, up to and including death. If you test positive for cocaine, your surgery will be cancelled.  On the morning of surgery brush your teeth with toothpaste and water, you may rinse your mouth with mouthwash if you wish. Do not swallow any toothpaste or mouthwash.   Use CHG wipes as directed on instruction sheet.  Do not wear jewelry, make-up, hairpins, clips or nail polish.  For welded (permanent) jewelry: bracelets, anklets, waist bands, etc.  Please have this removed prior to surgery.  If it is not removed, there is a chance that hospital personnel will need to cut it off on the day of surgery.  Do not wear lotions, powders, or perfumes.   Do not shave body hair from the neck down 48 hours before surgery.  Contact lenses, hearing aids and dentures may not be worn into surgery.  Do not bring valuables to the hospital. [facility] is not responsible for any missing/lost belongings or valuables.   Bring your C-PAP to the hospital with you in case you may have to spend the night.   Notify your doctor if there is any change in your medical condition (cold, fever, infection).  Wear comfortable clothing (specific to your surgery type) to the hospital.  After surgery, you can help prevent lung complications by  doing breathing exercises.  Take deep breaths and cough every 1-2 hours. Your doctor may order a device called an Incentive Spirometer to help you take deep breaths. When coughing or sneezing, hold a pillow firmly against your incision with both hands. This is called "splinting." Doing this helps protect your incision. It also decreases belly discomfort.  Please call the Pre-admissions Testing Dept. at [phone] if you have any questions about these instructions.   Surgery  Visitation Policy:  Visitor Passes   All visitors, including children, need an identification sticker when visiting. These stickers must be worn where they can be seen.   Labor & Delivery  Laboring women may have one designated support person and two other visitors of any age visit. The support person must remain the same. The visitors may switch with other visitors. Visitation is permitted 24 hours per day. The designated support person or a visitor over the age of 16 may sleep overnight in the patient's room. A doula registered with [facility] for labor and delivery support is not considered a visitor. Doulas not registered with  are considered visitors.  Mother Baby Unit, OB Specialty and Gynecological Care  A designated support person and three visitors of any age may visit. The three visitors may switch out. The designated support person or a visitor age 40 or older may stay overnight in the room. During the postpartum period (up to 6 weeks), if the mother is the patient, she can have her newborn stay with her if there is another support person present who can be responsible for the baby.   Preparing the Skin Before Surgery  To help prevent the risk of infection at your surgical site, we are now providing you with rinse-free Sage 2% Chlorhexidine  Gluconate (CHG) disposable wipes.  Chlorhexidine  Gluconate (CHG) Soap  o An antiseptic cleaner that kills germs and bonds with the skin to continue killing germs even after washing  o Used for showering the night before surgery and morning of surgery  The night before surgery: Shower or bathe with warm water. Do not apply perfume, lotions, powders. Wait one hour after shower. Skin should be dry and cool. Open Sage wipe package - use 6 disposable cloths. Wipe body using one cloth for the right arm, one cloth for the left arm, one cloth for the right leg, one cloth for the left leg, one cloth for the chest/abdomen area,  and one cloth for the back. Do not use on open wounds or sores. Do not use on face or genitals (private parts). If you are breast feeding, do not use on breasts. 5. Do not rinse, allow to dry.  6. Skin may feel tacky for several minutes. 7. Dress in clean clothes. 8. Place clean sheets on your bed and do not sleep with pets.   REPEAT ABOVE ON THE MORNING OF SURGERY BEFORE ARRIVING TO THE HOSPITAL.

## 2024-01-04 NOTE — OB Triage Note (Signed)
 Patient sent to triage from office for monitoring due to decreased fetal movement. Patient denies contractions, leaking of fluid, or vaginal bleeding. EFM applied and assessing. CNM aware of patient's arrival on unit.

## 2024-01-04 NOTE — OB Triage Note (Signed)
 Discharge teaching provided. Patient discharged to home.

## 2024-01-09 ENCOUNTER — Encounter
Admission: RE | Admit: 2024-01-09 | Discharge: 2024-01-09 | Disposition: A | Source: Ambulatory Visit | Attending: Obstetrics and Gynecology | Admitting: Obstetrics and Gynecology

## 2024-01-09 DIAGNOSIS — Z98891 History of uterine scar from previous surgery: Secondary | ICD-10-CM

## 2024-01-09 DIAGNOSIS — Z01812 Encounter for preprocedural laboratory examination: Secondary | ICD-10-CM | POA: Insufficient documentation

## 2024-01-09 LAB — CBC
HCT: 33.7 % — ABNORMAL LOW (ref 36.0–46.0)
Hemoglobin: 11.4 g/dL — ABNORMAL LOW (ref 12.0–15.0)
MCH: 28.7 pg (ref 26.0–34.0)
MCHC: 33.8 g/dL (ref 30.0–36.0)
MCV: 84.9 fL (ref 80.0–100.0)
Platelets: 295 K/uL (ref 150–400)
RBC: 3.97 MIL/uL (ref 3.87–5.11)
RDW: 13.4 % (ref 11.5–15.5)
WBC: 8.8 K/uL (ref 4.0–10.5)
nRBC: 0 % (ref 0.0–0.2)

## 2024-01-09 LAB — HIV ANTIBODY (ROUTINE TESTING W REFLEX): HIV Screen 4th Generation wRfx: NONREACTIVE

## 2024-01-09 MED ORDER — LACTATED RINGERS IV SOLN: INTRAVENOUS | Status: DC

## 2024-01-09 MED ORDER — ORAL CARE MOUTH RINSE: 15.0000 mL | Freq: Once | OROMUCOSAL | Status: AC

## 2024-01-09 MED ORDER — POVIDONE-IODINE 10 % EX SWAB: 2.0000 | Freq: Once | CUTANEOUS | Status: DC

## 2024-01-09 MED ORDER — CEFAZOLIN SODIUM-DEXTROSE 2-4 GM/100ML-% IV SOLN
2.0000 g | INTRAVENOUS | Status: AC
  Administered 2024-01-10: 2 g via INTRAVENOUS
  Filled 2024-01-09: qty 100

## 2024-01-09 MED ORDER — CHLORHEXIDINE GLUCONATE 0.12 % MT SOLN
15.0000 mL | Freq: Once | OROMUCOSAL | Status: AC
  Administered 2024-01-10: 15 mL via OROMUCOSAL
  Filled 2024-01-09: qty 15

## 2024-01-09 MED ORDER — CEFAZOLIN SODIUM-DEXTROSE 2-4 GM/100ML-% IV SOLN: 2.0000 g | INTRAVENOUS | Status: DC

## 2024-01-09 NOTE — Anesthesia Preprocedure Evaluation (Signed)
 Anesthesia Evaluation  Patient identified by MRN, date of birth, ID band Patient awake    Reviewed: Allergy & Precautions, H&P , NPO status , Patient's Chart, lab work & pertinent test results  Airway Mallampati: II  TM Distance: >3 FB Neck ROM: full    Dental  (+) Chipped   Pulmonary neg pulmonary ROS   Pulmonary exam normal        Cardiovascular negative cardio ROS Normal cardiovascular exam     Neuro/Psych negative neurological ROS  negative psych ROS   GI/Hepatic negative GI ROS, Neg liver ROS,,,  Endo/Other    Class 3 obesity  Renal/GU      Musculoskeletal   Abdominal  (+) + obese  Peds  Hematology negative hematology ROS (+)   Anesthesia Other Findings H/o CD History of IUFD @ 17wks  Past Medical History: No date: Anxiety No date: Blood type, Rh positive No date: Cervical insufficiency in pregnancy, antepartum     Comment:  prior pregnancy No date: Genital herpes No date: GERD (gastroesophageal reflux disease) No date: Obesity No date: Polycystic disease, ovaries  Past Surgical History: 2016: CERVICAL CERCLAGE 09/02/2014: CESAREAN SECTION; N/A     Comment:  Procedure: CESAREAN SECTION;  Surgeon: Glory High,              MD;  Location: [facility] ORS;  Service: Obstetrics;                Laterality: N/A; 05/02/2015: CHOLECYSTECTOMY; N/A     Comment:  Procedure: LAPAROSCOPIC CHOLECYSTECTOMY WITH               INTRAOPERATIVE CHOLANGIOGRAM;  Surgeon: Larinda Unknown Sharps, MD;  Location: [facility] ORS;  Service: General;                Laterality: N/A; 10/29/2014: INTRAUTERINE DEVICE (IUD) INSERTION     Comment:  Liletta 04/25/2018: LAPAROSCOPIC GASTRIC SLEEVE RESECTION; N/A     Comment:  Procedure: LAPAROSCOPIC GASTRIC SLEEVE RESECTION, UPPER               ENDO, ERAS,  HIATAL HERNIA REPAIR;  Surgeon: Tanda Locus, MD;  Location: [facility];  Service: General;                 Laterality: N/A; 04/25/2018: UPPER GI ENDOSCOPY     Comment:  Procedure: UPPER GI ENDOSCOPY;  Surgeon: Tanda Locus,               MD;  Location: [facility];  Service: General;;     Reproductive/Obstetrics negative OB ROS                              Anesthesia Physical Anesthesia Plan  ASA: 3  Anesthesia Plan: Spinal   Post-op Pain Management:    Induction:   PONV Risk Score and Plan: Propofol  infusion  Airway Management Planned:   Additional Equipment:   Intra-op Plan:   Post-operative Plan:   Informed Consent: I have reviewed the patients History and Physical, chart, labs and discussed the procedure including the risks, benefits and alternatives for the proposed anesthesia with the patient or authorized representative who has indicated his/her understanding and acceptance.     Dental Advisory Given  Plan Discussed with: CRNA and Surgeon  Anesthesia Plan Comments:  Anesthesia Quick Evaluation

## 2024-01-10 ENCOUNTER — Encounter: Payer: Self-pay | Admitting: Anesthesiology

## 2024-01-10 ENCOUNTER — Other Ambulatory Visit: Payer: Self-pay

## 2024-01-10 ENCOUNTER — Inpatient Hospital Stay
Admission: RE | Admit: 2024-01-10 | Discharge: 2024-01-12 | DRG: 787 | Disposition: A | Attending: Obstetrics and Gynecology | Admitting: Obstetrics and Gynecology

## 2024-01-10 ENCOUNTER — Encounter: Admission: RE | Disposition: A | Payer: Self-pay | Source: Home / Self Care | Attending: Obstetrics and Gynecology

## 2024-01-10 ENCOUNTER — Encounter: Payer: Self-pay | Admitting: Obstetrics and Gynecology

## 2024-01-10 ENCOUNTER — Inpatient Hospital Stay: Payer: Self-pay | Admitting: Anesthesiology

## 2024-01-10 DIAGNOSIS — O99214 Obesity complicating childbirth: Secondary | ICD-10-CM | POA: Diagnosis present

## 2024-01-10 DIAGNOSIS — Z148 Genetic carrier of other disease: Secondary | ICD-10-CM

## 2024-01-10 DIAGNOSIS — A6000 Herpesviral infection of urogenital system, unspecified: Secondary | ICD-10-CM | POA: Diagnosis present

## 2024-01-10 DIAGNOSIS — O9832 Other infections with a predominantly sexual mode of transmission complicating childbirth: Secondary | ICD-10-CM | POA: Diagnosis present

## 2024-01-10 DIAGNOSIS — Z7982 Long term (current) use of aspirin: Secondary | ICD-10-CM

## 2024-01-10 DIAGNOSIS — O09523 Supervision of elderly multigravida, third trimester: Secondary | ICD-10-CM | POA: Diagnosis present

## 2024-01-10 DIAGNOSIS — O0993 Supervision of high risk pregnancy, unspecified, third trimester: Secondary | ICD-10-CM

## 2024-01-10 DIAGNOSIS — Z3A39 39 weeks gestation of pregnancy: Secondary | ICD-10-CM

## 2024-01-10 DIAGNOSIS — Z98891 History of uterine scar from previous surgery: Principal | ICD-10-CM

## 2024-01-10 DIAGNOSIS — E66813 Obesity, class 3: Secondary | ICD-10-CM | POA: Diagnosis present

## 2024-01-10 DIAGNOSIS — Z8249 Family history of ischemic heart disease and other diseases of the circulatory system: Secondary | ICD-10-CM

## 2024-01-10 DIAGNOSIS — Z833 Family history of diabetes mellitus: Secondary | ICD-10-CM

## 2024-01-10 DIAGNOSIS — D62 Acute posthemorrhagic anemia: Secondary | ICD-10-CM | POA: Diagnosis not present

## 2024-01-10 DIAGNOSIS — O9081 Anemia of the puerperium: Secondary | ICD-10-CM | POA: Diagnosis not present

## 2024-01-10 DIAGNOSIS — O34211 Maternal care for low transverse scar from previous cesarean delivery: Principal | ICD-10-CM | POA: Diagnosis present

## 2024-01-10 DIAGNOSIS — O99824 Streptococcus B carrier state complicating childbirth: Secondary | ICD-10-CM | POA: Diagnosis present

## 2024-01-10 LAB — SYPHILIS: RPR W/REFLEX TO RPR TITER AND TREPONEMAL ANTIBODIES, TRADITIONAL SCREENING AND DIAGNOSIS ALGORITHM: RPR Ser Ql: NONREACTIVE

## 2024-01-10 SURGERY — Surgical Case
Anesthesia: Spinal | Site: Abdomen

## 2024-01-10 MED ORDER — DIPHENHYDRAMINE HCL 25 MG PO CAPS: 25.0000 mg | ORAL_CAPSULE | Freq: Four times a day (QID) | ORAL | Status: DC | PRN

## 2024-01-10 MED ORDER — BUPIVACAINE HCL (PF) 0.5 % IJ SOLN
INTRAMUSCULAR | Status: AC
  Filled 2024-01-10: qty 30

## 2024-01-10 MED ORDER — OXYTOCIN-SODIUM CHLORIDE 30-0.9 UT/500ML-% IV SOLN
INTRAVENOUS | Status: AC
  Filled 2024-01-10: qty 500

## 2024-01-10 MED ORDER — OXYTOCIN-SODIUM CHLORIDE 30-0.9 UT/500ML-% IV SOLN
2.5000 [IU]/h | INTRAVENOUS | Status: DC
  Administered 2024-01-10: 2.5 [IU]/h via INTRAVENOUS

## 2024-01-10 MED ORDER — DEXAMETHASONE SOD PHOSPHATE PF 10 MG/ML IJ SOLN
INTRAMUSCULAR | Status: DC | PRN
  Administered 2024-01-10: 5 mg via INTRAVENOUS

## 2024-01-10 MED ORDER — BUPIVACAINE HCL (PF) 0.5 % IJ SOLN
5.0000 mL | Freq: Once | INTRAMUSCULAR | Status: AC
  Filled 2024-01-10: qty 10

## 2024-01-10 MED ORDER — ONDANSETRON HCL 4 MG/2ML IJ SOLN: 4.0000 mg | Freq: Three times a day (TID) | INTRAMUSCULAR | Status: DC | PRN

## 2024-01-10 MED ORDER — MORPHINE SULFATE (PF) 0.5 MG/ML IJ SOLN
INTRAMUSCULAR | Status: DC | PRN
  Administered 2024-01-10: .1 mg via INTRATHECAL

## 2024-01-10 MED ORDER — PHENYLEPHRINE 80 MCG/ML (10ML) SYRINGE FOR IV PUSH (FOR BLOOD PRESSURE SUPPORT)
PREFILLED_SYRINGE | INTRAVENOUS | Status: AC
  Filled 2024-01-10: qty 10

## 2024-01-10 MED ORDER — SOD CITRATE-CITRIC ACID 500-334 MG/5ML PO SOLN
ORAL | Status: AC
  Filled 2024-01-10: qty 15

## 2024-01-10 MED ORDER — ACETAMINOPHEN 500 MG PO TABS
1000.0000 mg | ORAL_TABLET | Freq: Four times a day (QID) | ORAL | Status: AC
  Administered 2024-01-10 – 2024-01-11 (×4): 1000 mg via ORAL
  Filled 2024-01-10 (×4): qty 2

## 2024-01-10 MED ORDER — OXYCODONE-ACETAMINOPHEN 5-325 MG PO TABS
1.0000 | ORAL_TABLET | ORAL | Status: DC | PRN
  Administered 2024-01-11 – 2024-01-12 (×4): 1 via ORAL
  Filled 2024-01-10 (×3): qty 1

## 2024-01-10 MED ORDER — KETOROLAC TROMETHAMINE 30 MG/ML IJ SOLN
30.0000 mg | Freq: Four times a day (QID) | INTRAMUSCULAR | Status: AC | PRN
  Administered 2024-01-11: 30 mg via INTRAVENOUS
  Filled 2024-01-10: qty 1

## 2024-01-10 MED ORDER — NALOXONE HCL 0.4 MG/ML IJ SOLN: 0.4000 mg | INTRAMUSCULAR | Status: DC | PRN

## 2024-01-10 MED ORDER — BUPIVACAINE IN DEXTROSE 0.75-8.25 % IT SOLN
INTRATHECAL | Status: DC | PRN
  Administered 2024-01-10: 1.5 mL via INTRATHECAL

## 2024-01-10 MED ORDER — SIMETHICONE 80 MG PO CHEW
80.0000 mg | CHEWABLE_TABLET | Freq: Three times a day (TID) | ORAL | Status: DC
  Administered 2024-01-10 – 2024-01-12 (×6): 80 mg via ORAL
  Filled 2024-01-10 (×7): qty 1

## 2024-01-10 MED ORDER — MENTHOL 3 MG MT LOZG: 1.0000 | LOZENGE | OROMUCOSAL | Status: DC | PRN

## 2024-01-10 MED ORDER — WITCH HAZEL-GLYCERIN EX PADS: 1.0000 | MEDICATED_PAD | CUTANEOUS | Status: DC | PRN

## 2024-01-10 MED ORDER — VALACYCLOVIR HCL 500 MG PO TABS
500.0000 mg | ORAL_TABLET | Freq: Two times a day (BID) | ORAL | Status: DC
  Administered 2024-01-10 – 2024-01-12 (×5): 500 mg via ORAL
  Filled 2024-01-10 (×5): qty 1

## 2024-01-10 MED ORDER — KETOROLAC TROMETHAMINE 30 MG/ML IJ SOLN
INTRAMUSCULAR | Status: AC
  Filled 2024-01-10: qty 1

## 2024-01-10 MED ORDER — SOD CITRATE-CITRIC ACID 500-334 MG/5ML PO SOLN
30.0000 mL | ORAL | Status: AC
  Administered 2024-01-10: 30 mL via ORAL

## 2024-01-10 MED ORDER — MORPHINE SULFATE (PF) 0.5 MG/ML IJ SOLN
INTRAMUSCULAR | Status: AC
  Filled 2024-01-10: qty 10

## 2024-01-10 MED ORDER — DIPHENHYDRAMINE HCL 50 MG/ML IJ SOLN
INTRAMUSCULAR | Status: DC | PRN
  Administered 2024-01-10: 12.5 mg via INTRAVENOUS

## 2024-01-10 MED ORDER — DIPHENHYDRAMINE HCL 50 MG/ML IJ SOLN: 12.5000 mg | INTRAMUSCULAR | Status: DC | PRN

## 2024-01-10 MED ORDER — SODIUM CHLORIDE 0.9% FLUSH: 3.0000 mL | INTRAVENOUS | Status: DC | PRN

## 2024-01-10 MED ORDER — OXYTOCIN-SODIUM CHLORIDE 30-0.9 UT/500ML-% IV SOLN
INTRAVENOUS | Status: DC | PRN
  Administered 2024-01-10 (×2): 30 [IU] via INTRAVENOUS

## 2024-01-10 MED ORDER — INFLUENZA VIRUS VACC SPLIT PF (FLUZONE) 0.5 ML IM SUSY: 0.5000 mL | PREFILLED_SYRINGE | INTRAMUSCULAR | Status: DC

## 2024-01-10 MED ORDER — PHENYLEPHRINE 80 MCG/ML (10ML) SYRINGE FOR IV PUSH (FOR BLOOD PRESSURE SUPPORT)
PREFILLED_SYRINGE | INTRAVENOUS | Status: DC | PRN
  Administered 2024-01-10: 80 ug via INTRAVENOUS
  Administered 2024-01-10: 160 ug via INTRAVENOUS
  Administered 2024-01-10: 80 ug via INTRAVENOUS
  Administered 2024-01-10: 160 ug via INTRAVENOUS

## 2024-01-10 MED ORDER — SOD CITRATE-CITRIC ACID 500-334 MG/5ML PO SOLN: 30.0000 mL | ORAL | Status: DC

## 2024-01-10 MED ORDER — DIPHENHYDRAMINE HCL 25 MG PO CAPS: 25.0000 mg | ORAL_CAPSULE | ORAL | Status: DC | PRN

## 2024-01-10 MED ORDER — FENTANYL CITRATE (PF) 100 MCG/2ML IJ SOLN
INTRAMUSCULAR | Status: AC
  Filled 2024-01-10: qty 2

## 2024-01-10 MED ORDER — PRENATAL MULTIVITAMIN CH
1.0000 | ORAL_TABLET | Freq: Every day | ORAL | Status: DC
  Administered 2024-01-11: 1 via ORAL
  Filled 2024-01-10: qty 1

## 2024-01-10 MED ORDER — SCOPOLAMINE 1 MG/3DAYS TD PT72: 1.0000 | MEDICATED_PATCH | Freq: Once | TRANSDERMAL | Status: DC

## 2024-01-10 MED ORDER — FENTANYL CITRATE (PF) 100 MCG/2ML IJ SOLN
INTRAMUSCULAR | Status: DC | PRN
  Administered 2024-01-10: 15 ug via INTRATHECAL

## 2024-01-10 MED ORDER — NALOXONE HCL 4 MG/10ML IJ SOLN: 1.0000 ug/kg/h | INTRAVENOUS | Status: DC | PRN

## 2024-01-10 MED ORDER — ROCURONIUM BROMIDE 10 MG/ML (PF) SYRINGE
PREFILLED_SYRINGE | INTRAVENOUS | Status: AC
  Filled 2024-01-10: qty 10

## 2024-01-10 MED ORDER — FERROUS SULFATE 325 (65 FE) MG PO TABS
325.0000 mg | ORAL_TABLET | Freq: Two times a day (BID) | ORAL | Status: DC
  Administered 2024-01-10 – 2024-01-12 (×4): 325 mg via ORAL
  Filled 2024-01-10 (×5): qty 1

## 2024-01-10 MED ORDER — ACETAMINOPHEN 10 MG/ML IV SOLN
INTRAVENOUS | Status: AC
  Filled 2024-01-10: qty 100

## 2024-01-10 MED ORDER — COCONUT OIL OIL: 1.0000 | TOPICAL_OIL | Status: DC | PRN

## 2024-01-10 MED ORDER — IBUPROFEN 600 MG PO TABS
600.0000 mg | ORAL_TABLET | Freq: Four times a day (QID) | ORAL | Status: DC
  Administered 2024-01-11 – 2024-01-12 (×4): 600 mg via ORAL
  Filled 2024-01-10 (×4): qty 1

## 2024-01-10 MED ORDER — OXYCODONE HCL 5 MG PO TABS: 5.0000 mg | ORAL_TABLET | Freq: Four times a day (QID) | ORAL | Status: DC | PRN

## 2024-01-10 MED ORDER — ONDANSETRON HCL 4 MG/2ML IJ SOLN
INTRAMUSCULAR | Status: AC
  Filled 2024-01-10: qty 2

## 2024-01-10 MED ORDER — KETOROLAC TROMETHAMINE 30 MG/ML IJ SOLN: 30.0000 mg | Freq: Four times a day (QID) | INTRAMUSCULAR | Status: AC | PRN

## 2024-01-10 MED ORDER — BUPIVACAINE 0.25 % ON-Q PUMP DUAL CATH 400 ML
400.0000 mL | INJECTION | Status: DC
  Filled 2024-01-10: qty 400

## 2024-01-10 MED ORDER — ACETAMINOPHEN 10 MG/ML IV SOLN
INTRAVENOUS | Status: DC | PRN
  Administered 2024-01-10: 1000 mg via INTRAVENOUS

## 2024-01-10 MED ORDER — OXYCODONE-ACETAMINOPHEN 5-325 MG PO TABS
2.0000 | ORAL_TABLET | ORAL | Status: DC | PRN
  Filled 2024-01-10: qty 2

## 2024-01-10 MED ORDER — LACTATED RINGERS IV SOLN: INTRAVENOUS | Status: DC

## 2024-01-10 MED ORDER — DIBUCAINE (PERIANAL) 1 % EX OINT: 1.0000 | TOPICAL_OINTMENT | CUTANEOUS | Status: DC | PRN

## 2024-01-10 MED ORDER — SENNOSIDES-DOCUSATE SODIUM 8.6-50 MG PO TABS
2.0000 | ORAL_TABLET | ORAL | Status: DC
  Administered 2024-01-11: 2 via ORAL
  Filled 2024-01-10: qty 2

## 2024-01-10 MED ORDER — KETOROLAC TROMETHAMINE 30 MG/ML IJ SOLN
INTRAMUSCULAR | Status: DC | PRN
  Administered 2024-01-10: 30 mg via INTRAVENOUS

## 2024-01-10 MED ORDER — PHENYLEPHRINE HCL-NACL 20-0.9 MG/250ML-% IV SOLN
INTRAVENOUS | Status: DC | PRN
  Administered 2024-01-10: 30 ug/min via INTRAVENOUS

## 2024-01-10 MED ORDER — ONDANSETRON HCL 4 MG/2ML IJ SOLN
INTRAMUSCULAR | Status: DC | PRN
  Administered 2024-01-10: 4 mg via INTRAVENOUS

## 2024-01-10 MED ORDER — LIDOCAINE HCL (PF) 1 % IJ SOLN
INTRAMUSCULAR | Status: DC | PRN
  Administered 2024-01-10: 30 mg via SUBCUTANEOUS

## 2024-01-10 MED ORDER — EPHEDRINE 5 MG/ML INJ
INTRAVENOUS | Status: AC
  Filled 2024-01-10: qty 5

## 2024-01-10 SURGICAL SUPPLY — 32 items
BENZOIN TINCTURE PRP APPL 2/3 (GAUZE/BANDAGES/DRESSINGS) ×1 IMPLANT
CATH KIT ON-Q SILVERSOAK 5 (CATHETERS) ×2 IMPLANT
DERMABOND ADVANCED .7 DNX12 (GAUZE/BANDAGES/DRESSINGS) ×1 IMPLANT
DRSG OPSITE POSTOP 4X10 (GAUZE/BANDAGES/DRESSINGS) ×1 IMPLANT
DRSG TELFA 3X8 NADH STRL (GAUZE/BANDAGES/DRESSINGS) ×1 IMPLANT
ELECT CAUTERY BLADE 6.4 (BLADE) ×1 IMPLANT
ELECTRODE REM PT RTRN 9FT ADLT (ELECTROSURGICAL) ×1 IMPLANT
EXTRACTOR VACUUM KIWI (MISCELLANEOUS) IMPLANT
GAUZE PAD ABD 7.5X8 STRL (GAUZE/BANDAGES/DRESSINGS) IMPLANT
GAUZE SPONGE 4X4 12PLY STRL (GAUZE/BANDAGES/DRESSINGS) ×1 IMPLANT
GLOVE BIO SURGEON STRL SZ7 (GLOVE) ×1 IMPLANT
GLOVE BIOGEL M 6.5 STRL (GLOVE) IMPLANT
GLOVE BIOGEL M 7.0 STRL (GLOVE) IMPLANT
GLOVE INDICATOR 7.5 STRL GRN (GLOVE) ×1 IMPLANT
GLOVE SURG SYN 6.5 PF PI BL (GLOVE) IMPLANT
GOWN STRL REUS W/ TWL LRG LVL3 (GOWN DISPOSABLE) ×3 IMPLANT
MANIFOLD NEPTUNE II (INSTRUMENTS) ×1 IMPLANT
MAT PREVALON FULL STRYKER (MISCELLANEOUS) ×1 IMPLANT
PACK C SECTION AR (MISCELLANEOUS) ×1 IMPLANT
PAD OB MATERNITY 11 LF (PERSONAL CARE ITEMS) ×2 IMPLANT
PAD PREP OB/GYN DISP 24X41 (PERSONAL CARE ITEMS) ×1 IMPLANT
RETRACTOR TRAXI PANNICULUS (MISCELLANEOUS) IMPLANT
SCRUB CHG 4% DYNA-HEX 4OZ (MISCELLANEOUS) ×1 IMPLANT
SOLN 0.9% NACL POUR BTL 1000ML (IV SOLUTION) ×1 IMPLANT
SOLN STERILE WATER 500 ML (IV SOLUTION) ×1 IMPLANT
STAPLER INSORB 30 2030 C-SECTI (MISCELLANEOUS) IMPLANT
STRIP CLOSURE SKIN 1/2X4 (GAUZE/BANDAGES/DRESSINGS) ×1 IMPLANT
SUT PDS AB 1 TP1 96 (SUTURE) ×1 IMPLANT
SUT VIC AB 0 CTX36XBRD ANBCTRL (SUTURE) ×2 IMPLANT
SUT VIC AB 3-0 SH 27X BRD (SUTURE) IMPLANT
SUTURE MNCRL 4-0 27XMF (SUTURE) ×1 IMPLANT
TRAP FLUID SMOKE EVACUATOR (MISCELLANEOUS) ×1 IMPLANT

## 2024-01-10 NOTE — H&P (Signed)
 OB History & Physical   History of Present Illness:  Chief Complaint: here for repeat c-section  HPI:  Regina Chambers is a 33 y.o. G36P1011 female at [redacted]w[redacted]d dated by LMP consistent with 10 week ultrasound.  Her pregnancy has been complicated by h/o HSV, c-section, obesity, SMA carrier status.    She reports inconsistent contractions.   She denies leakage of fluid.   She denies vaginal bleeding.   She reports fetal movement.    Total weight gain for pregnancy: 15.9 kg   Obstetrical Problem List: THIRD Problems (from 06/01/23 to present)     No problems associated with this episode.        Maternal Medical History:   Past Medical History:  Diagnosis Date   Anxiety    Blood type, Rh positive    Cervical insufficiency in pregnancy, antepartum    prior pregnancy   Genital herpes    GERD (gastroesophageal reflux disease)    Obesity    Polycystic disease, ovaries     Past Surgical History:  Procedure Laterality Date   CERVICAL CERCLAGE  2016   CESAREAN SECTION N/A 09/02/2014   Procedure: CESAREAN SECTION;  Surgeon: Glory High, MD;  Location: [facility] ORS;  Service: Obstetrics;  Laterality: N/A;   CHOLECYSTECTOMY N/A 05/02/2015   Procedure: LAPAROSCOPIC CHOLECYSTECTOMY WITH INTRAOPERATIVE CHOLANGIOGRAM;  Surgeon: Larinda Unknown Sharps, MD;  Location: [facility] ORS;  Service: General;  Laterality: N/A;   INTRAUTERINE DEVICE (IUD) INSERTION  10/29/2014   Liletta   LAPAROSCOPIC GASTRIC SLEEVE RESECTION N/A 04/25/2018   Procedure: LAPAROSCOPIC GASTRIC SLEEVE RESECTION, UPPER ENDO, ERAS,  HIATAL HERNIA REPAIR;  Surgeon: Tanda Locus, MD;  Location: [facility];  Service: General;  Laterality: N/A;   UPPER GI ENDOSCOPY  04/25/2018   Procedure: UPPER GI ENDOSCOPY;  Surgeon: Tanda Locus, MD;  Location: [facility];  Service: General;;    No Known Allergies  Prior to Admission medications   Medication Sig Start Date End Date Taking? Authorizing Provider  aspirin 81 MG chewable tablet Chew 81 mg  by mouth daily.   Yes [provider]  CHOLINE PO Take 1 Dose by mouth daily.   Yes [provider]  Prenatal Vit-Fe Fumarate-FA (PRENATAL MULTIVITAMIN) TABS tablet Take 1 tablet by mouth daily at 12 noon.   Yes [provider]  valACYclovir  (VALTREX ) 500 MG tablet Take 500 mg by mouth 2 (two) times daily. 12/13/23  Yes [provider]    OB History  Gravida Para Term Preterm AB Living  3 1 1  0 1 1  SAB IAB Ectopic Multiple Live Births  1 0 0 0 1    # Outcome Date GA Lbr Len/2nd Weight Sex Type Anes PTL Lv  3 Current           2 Term 09/02/14 [redacted]w[redacted]d  2900 g F CS-LVertical EPI  LIV  1 SAB 10/09/13 [redacted]w[redacted]d    SAB   FD     Complications: Cervical insufficiency in pregnancy, antepartum    Prenatal care site: [facility]  Social History: She  reports that she has never smoked. She has never used smokeless tobacco. She reports that she does not currently use alcohol. She reports that she does not use drugs.  Family History: family history includes Diabetes in her paternal grandfather; Heart disease in her father; Hypertension in her father and paternal grandfather; Kidney disease in her father.   Review of Systems:  Review of Systems  Constitutional: Negative.   HENT: Negative.  Eyes: Negative.   Respiratory: Negative.    Cardiovascular: Negative.   Gastrointestinal: Negative.   Genitourinary: Negative.   Musculoskeletal: Negative.   Skin: Negative.   Neurological: Negative.   Psychiatric/Behavioral: Negative.       Physical Exam:  BP 113/77 (BP Location: Left Arm)   Pulse (!) 103   Temp 97.9 F (36.6 C) (Oral)   Resp 18   LMP 04/09/2023 (Exact Date)   Physical Exam Constitutional:      General: She is not in acute distress.    Appearance: Normal appearance. She is well-developed.  HENT:     Head: Normocephalic and atraumatic.  Eyes:     General: No scleral icterus.    Conjunctiva/sclera: Conjunctivae normal.  Cardiovascular:      Rate and Rhythm: Normal rate and regular rhythm.     Heart sounds: No murmur heard.    No friction rub. No gallop.  Pulmonary:     Effort: Pulmonary effort is normal. No respiratory distress.     Breath sounds: Normal breath sounds. No wheezing or rales.  Abdominal:     General: Bowel sounds are normal. There is no distension.     Palpations: Abdomen is soft. There is mass (gravid, NT).     Tenderness: There is no abdominal tenderness. There is no guarding or rebound.  Musculoskeletal:        General: Normal range of motion.     Cervical back: Normal range of motion and neck supple.  Neurological:     General: No focal deficit present.     Mental Status: She is alert and oriented to person, place, and time.     Cranial Nerves: No cranial nerve deficit.  Skin:    General: Skin is warm and dry.     Findings: No erythema.  Psychiatric:        Mood and Affect: Mood normal.        Behavior: Behavior normal.        Judgment: Judgment normal.     Assessment:  Regina Chambers is a 33 y.o. G44P1011 female at [redacted]w[redacted]d with history of c-section, desires repeat.   Plan:  Admit to Labor & Delivery  CBC, T&S, NPO, IVF GBS positive.   Consents reviewed and signed     Garnette Mace, MD 01/10/2024 7:22 AM

## 2024-01-10 NOTE — Op Note (Signed)
 Cesarean Section Operative Note    Patient Name: Regina Chambers  Date of Birth: 04/24/1990  MRN: [id number]  Date of Surgery: 01/10/2024   Pre-operative Diagnosis:  1) history of cesarean delivery, desires repeat. 2) intrauterine pregnancy at [redacted]w[redacted]d   Post-operative Diagnosis:  1) history of cesarean delivery, desires repeat. 2) intrauterine pregnancy at [redacted]w[redacted]d    Procedure: Repeat low-transverse cesarean delivery  Surgeon: Surgeons and Role:    * Regina Garnette BIRCH, MD - Primary   Assistants: Bobbette Brunswick, CNM; No other capable assistant available, in surgery requiring high level assistant.  Anesthesia: spinal   Findings:  1) normal appearing gravid uterus, fallopian tubes, and ovaries 2) dense adhesions of lower uterine segment and mid anterior uterus to anterior abdominal wall and bladder dome. 3) viable female infant with Apgars of 8 and 9, weight 3,470 grams   Quantified blood Loss: 805 mL  Total IV Fluids: 2,000 ml   Urine Output: 50 mL clear urine  Specimens: none  Complications: no complications  Disposition: PACU - hemodynamically stable.   Maternal Condition: stable   Baby condition / location:  Couplet care / Skin to Skin  Procedure Details:  The patient was seen in the Holding Room. The risks, benefits, complications, treatment options, and expected outcomes were discussed with the patient. The patient concurred with the proposed plan, giving informed consent. identified as Regina Chambers and the procedure verified as C-Section Delivery. A Time Out was held and the above information confirmed.   After induction of anesthesia, the patient was draped and prepped in the usual sterile manner. A Pfannenstiel incision was made and carried down through the subcutaneous tissue to the fascia. Fascial incision was made and extended transversely. The fascia was separated from the underlying rectus tissue superiorly and inferiorly. The peritoneum was  identified and entered. Peritoneal incision was extended longitudinally.  The bladder was adherent to the anterior abdominal wall and the mid uterus and lower uterine segment.  I was able to dissect off this dense adhesion as it was centralized on the uterus.  The bladder flap was bluntly and sharply freed from the lower uterine segment. A low transverse uterine incision was made and the hysterotomy was extended with cranial-caudal tension. Delivered from cephalic presentation was a 3,470 gram Living newborn infant(s) or Female with Apgar scores of 8 at one minute and 9 at five minutes. Cord ph was not sent the umbilical cord was clamped and cut cord blood was not obtained for evaluation.  Of note, the fetal head required the Kiwi flat vacuum to elevate the head sufficiently for removal.  The placenta was removed Intact and appeared normal. The uterine outline, tubes and ovaries appeared normal. The uterine incision was closed with running locked sutures of 0 Vicryl.  A second layer of the same suture was thrown in an imbricating fashion.  Hemostasis was assured.  The uterus was returned to the abdomen and the paracolic gutters were cleared of all clots and debris.  Due to a small concern for bladder damage on entry, given uncertain location of the bladder on entry, the bladder was backfilled with 300 mL sterile milk and no defects or spillage of milk were noted.  The rectus muscles were inspected and found to be hemostatic.   The fascia was then reapproximated with running sutures of 1-0 PDS, looped. The subcuticular closure was performed using 4-0 monocryl. The skin closure was reinforced using 4-0 Monocryl and reinforced with surgical skin glue.  The surgical assistant  performed tissue retraction, assistance with suturing, and fundal pressure.  Instrument, sponge, and needle counts were correct prior the abdominal closure and were correct at the conclusion of the case.  The patient received Ancef  2 gram IV  prior to skin incision (within 30 minutes). For VTE prophylaxis she was wearing SCDs throughout the case.  The assistant surgeon was a CNM due to lack of availability of another sales promotion account executive.    Signed: Garnette CHARM Mace, MD 01/10/2024 9:43 AM

## 2024-01-10 NOTE — Transfer of Care (Cosign Needed)
 Immediate Anesthesia Transfer of Care Note  Patient: Regina Chambers  Procedure(s) Performed: CESAREAN DELIVERY (Abdomen)  Patient Location: PACU and OB High Risk  Anesthesia Type:Spinal  Level of Consciousness: awake, alert , and patient cooperative  Airway & Oxygen Therapy: Patient Spontanous Breathing  Post-op Assessment: Report given to RN, Post -op Vital signs reviewed and stable, and Patient moving all extremities  Post vital signs: Reviewed and stable  Last Vitals:  Vitals Value Taken Time  BP 117/71 01/10/24 09:55  Temp 36.5 C 01/10/24 09:55  Pulse 90 01/10/24 09:55  Resp 26 01/10/24 09:55  SpO2 99 % 01/10/24 09:55    Last Pain:  Vitals:   01/10/24 0955  TempSrc: Oral  PainSc: 0-No pain         Complications: No notable events documented.

## 2024-01-10 NOTE — Lactation Note (Signed)
 This note was copied from a baby's chart. Lactation Consultation Note  Patient Name: Regina Chambers Date: 01/10/2024 Age:35 hours Reason for consult: Initial assessment   Maternal Data  This was an initial assessment with a [name] (four hours old), mom, and dad. Mom disclosed prior breastfeeding history with her first child who is ten years old. This history included breastfeeding for five months before cessation due to infant's teeth growth. Mom mentioned using a nipple shield with her first child, but expressed uncertainty with using the tool and positioning the baby.   Feeding Mother's Current Feeding Choice: Breast Milk  LATCH Score Latch: Grasps breast easily, tongue down, lips flanged, rhythmical sucking.  Audible Swallowing: Spontaneous and intermittent  Type of Nipple: Flat  Comfort (Breast/Nipple): Soft / non-tender  Hold (Positioning): Assistance needed to correctly position infant at breast and maintain latch.  LATCH Score: 8  Lactation Tools Discussed/Used Tools: Nipple Shields Nipple shield size: 20  Interventions Interventions: Assisted with latch;Breast massage;Support pillows;Adjust position;Education;Breast feeding basics reviewed (A nipple shield (20mm) was used to assist with this latch.)  The advertising copywriter and student assisted mom and baby using the football hold with two support pillows and a receiving blanket for additional arm support. The lactation student supported mom with the placement of the nipple shield, and demonstrated placing it on the nipple after running it under water to assist with suction. The lactation student showed both mom and dad how the nipple should appear in the shield when a proper suction is achieved with the tool. The infant demonstrated a wide-open mouth and flanged lips and successfully latched to the nipple shield on the right breast from 12:52 to 13:00.   Mom and dad were provided with information  regarding breast massage during feedings to encourage milk production, colostrum, infant hunger cues, I/O, and the importance of feeding the baby at least eight times within a 24-hour period. The lactation student explained the benefits of skin-to-skin, and encouraged dad to do this with the baby during times when mom is resting or when mom is preparing to breastfeed and baby may be crying. The lactation consultant mentioned that mom may not need a nipple shield, and demonstrated how to compress the breast tissue into baby's mouth without the use of the shield. Both mom and dad were encouraged to call lactation or inform their nurse if they need additional support with latching the baby on the left side.  Consult Status Consult Status: Follow-up Follow-up type: In-patient  [facility] 01/10/2024, 1:15 PM

## 2024-01-10 NOTE — Anesthesia Procedure Notes (Signed)
 Spinal  Patient location during procedure: OR Start time: 01/10/2024 7:54 AM End time: 01/10/2024 8:00 AM Reason for block: surgical anesthesia Staffing Performed: anesthesiologist and other anesthesia staff  Anesthesiologist: Vicci Camellia Glatter, MD Other anesthesia staff: Carley Pac, RN Performed by: Carley Pac, RN Authorized by: Vicci Camellia Glatter, MD   Preanesthetic Checklist Completed: patient identified, IV checked, site marked, risks and benefits discussed, surgical consent, monitors and equipment checked, pre-op evaluation and timeout performed Spinal Block Patient position: sitting Prep: DuraPrep Patient monitoring: heart rate, cardiac monitor, continuous pulse ox and blood pressure Approach: midline Location: L4-5 Injection technique: single-shot Needle Needle type: Sprotte and Pencan  Needle gauge: 25 G Needle length: 9 cm Needle insertion depth: 7 cm Assessment Sensory level: T4 Events: CSF return and second provider Additional Notes Skin localized with 1% lidocaine . Landmarks identified for spinal placement. First spinal attempt by SRNA. Second attempt and successful placement into intrathecal space by MD. CSF continuous flow noted. Mid- and end-injection whirl present. Patient reports no paresthesias. Patient place in LUD.

## 2024-01-10 NOTE — Discharge Summary (Signed)
 Postpartum Discharge Summary  Patient Name: Regina Chambers DOB: 12-Mar-1990 MRN: [id number]  Date of admission: 01/10/2024 Delivery date:01/10/2024 Delivering provider: JACKSON, STEPHEN D Date of discharge: 01/12/2024  Primary OB: [facility] OFE:Ejupzwu'd last menstrual period was 04/09/2023 (exact date). EDC Estimated Date of Delivery: 01/14/24 Gestational Age at Delivery: [redacted]w[redacted]d   Admitting diagnosis: Breech presentation, single or unspecified fetus [O32.1XX0] History of cesarean delivery, currently pregnant [O34.219] History of cesarean delivery [Z98.891] Supervision of high risk pregnancy in third trimester [O09.93] Intrauterine pregnancy: [redacted]w[redacted]d     Secondary diagnosis:   Principal Problem:   History of cesarean delivery Active Problems:   Morbid obesity (HCC)   [redacted] weeks gestation of pregnancy   AMA (advanced maternal age) multigravida 35+, third trimester   Cesarean delivery delivered   Carrier of spinal muscular atrophy   Discharge Diagnosis: Term Pregnancy Delivered      Hospital course: Sceduled C/S   33 y.o. yo [id number] at [redacted]w[redacted]d was admitted to the hospital 01/10/2024 for scheduled cesarean section with the following indication:Elective Repeat.Delivery details are as follows:  Membrane Rupture Time/Date: 8:41 AM,01/10/2024  Delivery Method:C-Section, Low Transverse Operative Delivery:vacuum Details of operation can be found in separate operative note.  Patient had a postpartum course complicated byisolated temp > 24 hrs from d/c . HCT 21% required 300 mg venofer . Pt asymptomatic .  She is ambulating, tolerating a regular diet, passing flatus, and urinating well. Patient is discharged home in stable condition on  01/12/24        Newborn Data: Birth date:01/10/2024 Birth time:8:43 AM Gender:Female Living status:Living Apgars:8 ,9  Weight:3470 g                                              Post partum procedures:iv venofer Complications: None Delivery  Type: repeat cesarean section, low vertical incision Anesthesia: spinal anesthesia Placenta: manual removal To Pathology: No   Prenatal Labs:   Prenatal Labs: Blood type/Rh A Pos  Antibody screen neg  Rubella Immune  Varicella Immune  RPR NR  HBsAg Neg  HIV NR  GC neg  Chlamydia neg  Genetic screening cfDNA negative  1 hour GTT 164  3 hour GTT 99,230,150,56  GBS pos     Magnesium Sulfate received: No BMZ received: No Rhophylac:was not indicated MMR: was not indicated Varivax vaccine given: was not indicated Tdap vaccine: Given prenatally Flu vaccine: Given prenatally RSV vaccine:did not receive  Transfusion:No  Physical exam  Vitals:   01/11/24 2008 01/11/24 2328 01/12/24 0327 01/12/24 0435  BP: 109/71 115/61 115/79   Pulse: (!) 113 (!) 107 (!) 123 (!) 107  Resp: 20 20 18    Temp: 99.7 F (37.6 C) 98.6 F (37 C) 98.8 F (37.1 C)   TempSrc: Oral Oral Oral   SpO2: 99% 99% 99% 100%  Weight:      Height:       General: alert and cooperative Lochia: appropriate Uterine Fundus: firm Perineumn/a Incision: Healing well with no significant drainage, covered with occlusive OP site dressing  DVT Evaluation: No evidence of DVT seen on physical exam.  Labs: Lab Results  Component Value Date   WBC 12.5 (H) 01/11/2024   HGB 7.1 (L) 01/11/2024   HCT 21.0 (L) 01/11/2024   MCV 84.0 01/11/2024   PLT 195 01/11/2024      Latest Ref Rng & Units 01/14/2023  10:53 AM  CMP  Glucose 70 - 99 mg/dL 85   BUN 6 - 20 mg/dL 13   Creatinine 9.42 - 1.00 mg/dL 9.23   Sodium 865 - 855 mmol/L 140   Potassium 3.5 - 5.2 mmol/L 4.3   Chloride 96 - 106 mmol/L 103   CO2 20 - 29 mmol/L 28   Calcium  8.7 - 10.2 mg/dL 9.2   Total Protein 6.0 - 8.5 g/dL 6.4   Total Bilirubin 0.0 - 1.2 mg/dL 0.4   Alkaline Phos 44 - 121 IU/L 65   AST 0 - 40 IU/L 13   ALT 0 - 32 IU/L 21    Edinburgh Score:    01/12/2024    4:35 AM  Edinburgh Postnatal Depression Scale Screening Tool  I have been  able to laugh and see the funny side of things. 1  I have looked forward with enjoyment to things. 0  I have blamed myself unnecessarily when things went wrong. 2  I have been anxious or worried for no good reason. 1  I have felt scared or panicky for no good reason. 1  Things have been getting on top of me. 2  I have been so unhappy that I have had difficulty sleeping. 0  I have felt sad or miserable. 1  I have been so unhappy that I have been crying. 0  The thought of harming myself has occurred to me. 0  Edinburgh Postnatal Depression Scale Total 8     Postpartum VTE Prophylaxis  Recommend 6 weeks of prophylactic anticoagulation with LMWH or subcutaneous unfractionated heparin  if 1 or more high risk factor is present.  Recommend 14 days of prophylactic anticoagulation with LMWH or subcutaneous unfractionated heparin  if 3 or more moderate risk factors are present.   Risk assessment for postpartum VTE and prophylactic treatment: High risk factors: None Moderate risk factors: None and Cesarean delivery   Postpartum VTE prophylaxis with LMWH not indicated    After visit meds:  Allergies as of 01/12/2024   No Known Allergies      Medication List     STOP taking these medications    aspirin 81 MG chewable tablet   CHOLINE PO   valACYclovir  500 MG tablet Commonly known as: VALTREX        TAKE these medications    ibuprofen  800 MG tablet Commonly known as: ADVIL  Take 1 tablet (800 mg total) by mouth every 8 (eight) hours as needed.   ondansetron  4 MG tablet Commonly known as: Zofran  Take 1 tablet (4 mg total) by mouth daily as needed for nausea or vomiting.   oxyCODONE -acetaminophen  5-325 MG tablet Commonly known as: Percocet Take 1 tablet by mouth every 4 (four) hours as needed for severe pain (pain score 7-10).   oxyCODONE -acetaminophen  5-325 MG tablet Commonly known as: PERCOCET/ROXICET Take 1 tablet by mouth every 4 (four) hours as needed (pain score  4-7/10).   prenatal multivitamin Tabs tablet Take 1 tablet by mouth daily at 12 noon.       Discharge home in stable condition Infant Feeding: Breast Infant Disposition:home with mother Discharge instruction: per After Visit Summary and Postpartum booklet. Activity: Advance as tolerated. Pelvic rest for 6 weeks.  Diet: routine diet Anticipated Birth Control:  Contraceptives: IUD Mirena  Postpartum Appointment:6 weeks Additional Postpartum F/U: Incision check 1 week Future Appointments:No future appointments. Follow up Visit:  Follow-up Information     Leonce Garnette BIRCH, MD. Go on 01/19/2024.   Specialty: Obstetrics and Gynecology Why: For incision  check, (Keep previously scheduled appointment) Contact information: [address] [place] [address] [phone]                 Plan:  ISLAM VILLESCAS was discharged to home in good condition. Follow-up appointment as directed.  Percocet , motrin  , zofran  .   Signed: Debby Dinsmore MD

## 2024-01-11 ENCOUNTER — Encounter: Payer: Self-pay | Admitting: Obstetrics and Gynecology

## 2024-01-11 LAB — CBC
HCT: 22.5 % — ABNORMAL LOW (ref 36.0–46.0)
HCT: 21 % — ABNORMAL LOW (ref 36.0–46.0)
Hemoglobin: 7.5 g/dL — ABNORMAL LOW (ref 12.0–15.0)
Hemoglobin: 7.1 g/dL — ABNORMAL LOW (ref 12.0–15.0)
MCH: 28.7 pg (ref 26.0–34.0)
MCH: 28.4 pg (ref 26.0–34.0)
MCHC: 33.3 g/dL (ref 30.0–36.0)
MCHC: 33.8 g/dL (ref 30.0–36.0)
MCV: 86.2 fL (ref 80.0–100.0)
MCV: 84 fL (ref 80.0–100.0)
Platelets: 206 K/uL (ref 150–400)
Platelets: 195 K/uL (ref 150–400)
RBC: 2.61 MIL/uL — ABNORMAL LOW (ref 3.87–5.11)
RBC: 2.5 MIL/uL — ABNORMAL LOW (ref 3.87–5.11)
RDW: 13.3 % (ref 11.5–15.5)
RDW: 13.4 % (ref 11.5–15.5)
WBC: 15.6 K/uL — ABNORMAL HIGH (ref 4.0–10.5)
WBC: 12.5 K/uL — ABNORMAL HIGH (ref 4.0–10.5)
nRBC: 0 % (ref 0.0–0.2)
nRBC: 0 % (ref 0.0–0.2)

## 2024-01-11 LAB — BPAM RBC
Blood Product Expiration Date: 202512272359
ISSUE DATE / TIME: 202512031335
Unit Type and Rh: 6200

## 2024-01-11 LAB — PREPARE RBC (CROSSMATCH)

## 2024-01-11 LAB — TYPE AND SCREEN
ABO/RH(D): A POS
Antibody Screen: NEGATIVE
Extend sample reason: UNDETERMINED
Unit division: 0

## 2024-01-11 LAB — LACTIC ACID, PLASMA
Lactic Acid, Venous: 1.2 mmol/L (ref 0.5–1.9)
Lactic Acid, Venous: 1.5 mmol/L (ref 0.5–1.9)

## 2024-01-11 MED ORDER — SODIUM CHLORIDE 0.9% IV SOLUTION: Freq: Once | INTRAVENOUS | Status: AC

## 2024-01-11 NOTE — Sepsis Progress Note (Signed)
 Code sepsis protocol being monitored by elink. Bedside RN contacted to have Lactic acid and Blood cultures drawn asap. MD sent secure chat for antibiotic order.

## 2024-01-11 NOTE — Lactation Note (Signed)
 This note was copied from a baby's chart. Lactation Consultation Note  Patient Name: Regina Chambers Date: 01/11/2024 Age:35 hours Reason for consult: Follow-up assessment   Maternal Data Lactation to room for a follow up appointment w/ a 30hr old [name] and parents.  Mom stated that infant has been getting frustrated at the breast so she started giving him formula over night.  She has not been doing any pumping but still wants to breastfeed.   Feeding Mother's Current Feeding Choice: Breast Milk and Formula  While mom is pumping, FOB fed infant 14ml of formula.  During this time [facility] taught parents how to pace feed.  Infant satisfied after feeding.  Lactation Tools Discussed/Used Tools: Pump Breast pump type: Double-Electric Breast Pump Pump Education: Setup, frequency, and cleaning Reason for Pumping: Infant getting formula Pumping frequency: Mom will need to pump if infant receives a bottle of formula  LC set patient up with a DEBP and a size 18mm flange.  Education was provided to patient that anytime infant gets a bottle of formula that pumping needs to be integrated as infant is not stimulating the breast.  Mom verbalized understanding.  Drops of milk were noted on tips of moms nipples.   Interventions Interventions: Education;DEBP  Consult Status Consult Status: Follow-up Follow-up type: In-patient    Maryjean Corpening S Tiffani Kadow 01/11/2024, 4:26 PM

## 2024-01-11 NOTE — Anesthesia Postprocedure Evaluation (Signed)
 Anesthesia Post Note  Patient: Regina Chambers  Procedure(s) Performed: CESAREAN DELIVERY (Abdomen)  Patient location during evaluation: Mother Baby Anesthesia Type: Spinal Level of consciousness: oriented and awake and alert Pain management: pain level controlled Vital Signs Assessment: post-procedure vital signs reviewed and stable Respiratory status: spontaneous breathing and respiratory function stable Cardiovascular status: blood pressure returned to baseline and stable Postop Assessment: no headache, no backache, no apparent nausea or vomiting and able to ambulate Anesthetic complications: no   No notable events documented.   Last Vitals:  Vitals:   01/11/24 0100 01/11/24 0808  BP:  101/64  Pulse: 98 (!) 104  Resp:  18  Temp:  36.7 C  SpO2: 96% 99%    Last Pain:  Vitals:   01/11/24 0815  TempSrc:   PainSc: 4                  [name]

## 2024-01-11 NOTE — Progress Notes (Signed)
 Post Partum Day 1  Subjective: Doing well, no concerns. Ambulating without difficulty, pain managed with PO meds, tolerating regular diet, and voiding without difficulty.   No fever/chills, chest pain, shortness of breath, nausea/vomiting, or leg pain. No nipple or breast pain. No headache, visual changes, or RUQ/epigastric pain.  Objective: BP 101/64 (BP Location: Left Arm)   Pulse (!) 104   Temp 98.1 F (36.7 C) (Oral)   Resp 18   Ht 5' 2 (1.575 m)   Wt 103 kg   LMP 04/09/2023 (Exact Date)   SpO2 99%   Breastfeeding Unknown   BMI 41.53 kg/m    Physical Exam:  General: alert and cooperative Breasts: soft/nontender CV: RRR Pulm: nl effort Abdomen: soft, non-tender Uterine Fundus: firm Incision: healing well Perineum: intact Lochia: appropriate DVT Evaluation: No evidence of DVT seen on physical exam. Edinburgh:     06/01/2023    9:32 AM  [place] Postnatal Depression Scale Screening Tool  I have been able to laugh and see the funny side of things. 0   I have looked forward with enjoyment to things. 1   I have blamed myself unnecessarily when things went wrong. 2   I have been anxious or worried for no good reason. 2   I have felt scared or panicky for no good reason. 0   Things have been getting on top of me. 1   I have been so unhappy that I have had difficulty sleeping. 0   I have felt sad or miserable. 1   I have been so unhappy that I have been crying. 0   The thought of harming myself has occurred to me. 0   Edinburgh Postnatal Depression Scale Total 7      Data saved with a previous flowsheet row definition     Recent Labs    01/09/24 0844 01/11/24 0555  HGB 11.4* 7.5*  HCT 33.7* 22.5*  WBC 8.8 15.6*  PLT 295 206    Assessment/Plan: 33 y.o. [id number] postpartum day # 1  1. Continue routine postpartum care  2. Infant feeding status: breast feeding -Lactation consult PRN for breastfeeding   3. Contraception plan: IUD  4. Acute blood loss  anemia - clinically significant.  -Intervention: recommend blood transfusion 1u pRBC  5. Immunization status:   all immunizations up to date    Disposition: Continue inpatient postpartum care   LOS: 1 day   Regina Chambers, CNM 01/11/2024, 9:25 AM

## 2024-01-12 MED ORDER — OXYCODONE-ACETAMINOPHEN 5-325 MG PO TABS: 1.0000 | ORAL_TABLET | ORAL | 0 refills | Status: DC | PRN

## 2024-01-12 MED ORDER — IRON SUCROSE 300 MG IVPB - SIMPLE MED
300.0000 mg | Freq: Once | Status: AC
  Administered 2024-01-12: 300 mg via INTRAVENOUS
  Filled 2024-01-12: qty 300

## 2024-01-12 MED ORDER — ONDANSETRON HCL 4 MG PO TABS: 4.0000 mg | ORAL_TABLET | Freq: Every day | ORAL | 1 refills | Status: DC | PRN

## 2024-01-12 MED ORDER — IBUPROFEN 800 MG PO TABS: 800.0000 mg | ORAL_TABLET | Freq: Three times a day (TID) | ORAL | 0 refills | Status: DC | PRN

## 2024-01-12 NOTE — Lactation Note (Signed)
 This note was copied from a baby's chart. Lactation Consultation Note  Patient Name: Regina Chambers Date: 01/12/2024 Age:35 hours Reason for consult: Follow-up assessment;Other (Comment) (Discharge Education)   Maternal Data Lactation to room for a follow up assessment and discharge education.  Mom stated she continued to pump overnight and did see a little bit of milk.  Mom currently receiving iron at the time of the visit.  Feeding Mother's Current Feeding Choice: Breast Milk and Formula  Interventions Interventions: Breast feeding basics reviewed;Education;CDC milk storage guidelines  Lactation reviewed the importance of eating and staying hydrated.  LC also provided mom with a postpartum support group flyer at the [facility] in [place]. Reminded mom to continue to stimulate breast by pumping at least 8x w/in a 24hr period.  Discharge Discharge Education: Engorgement and breast care;Warning signs for feeding baby;Outpatient recommendation Pump: Personal;[facility] WIC Program: No  Education on engorgement prevention/treatment was discussed as well as breastmilk storage guidelines.  LC provided patient with a handout on breastmilk storage guidelines from [facility] outpatient lactation services phone number written on the white board in the room.  Patient verbalized understanding.  LC also provided education from the postpartum book about warning signs to look for in a poor feeding.    Consult Status Consult Status: Complete Follow-up type: Call as needed    Lynisha Osuch S Sadeen Wiegel 01/12/2024, 11:13 AM

## 2024-01-16 LAB — CULTURE, BLOOD (ROUTINE X 2)
Culture: NO GROWTH
Culture: NO GROWTH
Special Requests: ADEQUATE
Special Requests: ADEQUATE

## 2024-01-17 ENCOUNTER — Observation Stay
Admission: EM | Admit: 2024-01-17 | Discharge: 2024-01-17 | Disposition: A | Discharge status: Home / Self Care | Attending: Certified Nurse Midwife | Admitting: Obstetrics and Gynecology

## 2024-01-17 DIAGNOSIS — R519 Headache, unspecified: Principal | ICD-10-CM | POA: Diagnosis present

## 2024-01-17 LAB — COMPREHENSIVE METABOLIC PANEL WITH GFR
ALT: 24 U/L (ref 0–44)
AST: 25 U/L (ref 15–41)
Albumin: 3.2 g/dL — ABNORMAL LOW (ref 3.5–5.0)
Alkaline Phosphatase: 86 U/L (ref 38–126)
Anion gap: 11 (ref 5–15)
BUN: 9 mg/dL (ref 6–20)
CO2: 24 mmol/L (ref 22–32)
Calcium: 8.6 mg/dL — ABNORMAL LOW (ref 8.9–10.3)
Chloride: 103 mmol/L (ref 98–111)
Creatinine, Ser: 0.49 mg/dL (ref 0.44–1.00)
GFR, Estimated: >60 mL/min (ref >60)
Glucose, Bld: 96 mg/dL (ref 70–99)
Potassium: 3.5 mmol/L (ref 3.5–5.1)
Sodium: 138 mmol/L (ref 135–145)
Total Bilirubin: 0.8 mg/dL (ref 0.0–1.2)
Total Protein: 5.9 g/dL — ABNORMAL LOW (ref 6.5–8.1)

## 2024-01-17 LAB — CBC
HCT: 24.8 % — ABNORMAL LOW (ref 36.0–46.0)
Hemoglobin: 8.4 g/dL — ABNORMAL LOW (ref 12.0–15.0)
MCH: 29.3 pg (ref 26.0–34.0)
MCHC: 33.9 g/dL (ref 30.0–36.0)
MCV: 86.4 fL (ref 80.0–100.0)
Platelets: 510 K/uL — ABNORMAL HIGH (ref 150–400)
RBC: 2.87 MIL/uL — ABNORMAL LOW (ref 3.87–5.11)
RDW: 15.1 % (ref 11.5–15.5)
WBC: 9.8 K/uL (ref 4.0–10.5)
nRBC: 0 % (ref 0.0–0.2)

## 2024-01-17 LAB — PROTEIN / CREATININE RATIO, URINE
Creatinine, Urine: 36 mg/dL
Protein Creatinine Ratio: 0.2 mg/mg{creat} — ABNORMAL HIGH (ref 0.00–0.15)
Total Protein, Urine: 7 mg/dL

## 2024-01-17 MED ORDER — ACETAMINOPHEN 325 MG PO TABS
650.0000 mg | ORAL_TABLET | ORAL | Status: DC | PRN
  Administered 2024-01-17: 650 mg via ORAL
  Filled 2024-01-17: qty 2

## 2024-01-17 MED ORDER — PROCHLORPERAZINE EDISYLATE 10 MG/2ML IJ SOLN
10.0000 mg | Freq: Once | INTRAMUSCULAR | Status: AC
  Administered 2024-01-17: 10 mg via INTRAVENOUS
  Filled 2024-01-17: qty 2

## 2024-01-17 MED ORDER — METOCLOPRAMIDE HCL 5 MG/ML IJ SOLN
10.0000 mg | Freq: Once | INTRAMUSCULAR | Status: AC
  Administered 2024-01-17: 10 mg via INTRAVENOUS
  Filled 2024-01-17: qty 2

## 2024-01-17 MED ORDER — MAGNESIUM OXIDE -MG SUPPLEMENT 400 (240 MG) MG PO TABS
400.0000 mg | ORAL_TABLET | Freq: Once | ORAL | Status: AC
  Administered 2024-01-17: 400 mg via ORAL
  Filled 2024-01-17: qty 1

## 2024-01-17 MED ORDER — LACTATED RINGERS IV SOLN
INTRAVENOUS | Status: DC
  Administered 2024-01-17: 1000 mL via INTRAVENOUS

## 2024-01-17 MED ORDER — CALCIUM CARBONATE ANTACID 500 MG PO CHEW
2.0000 | CHEWABLE_TABLET | ORAL | Status: DC | PRN
  Administered 2024-01-17: 400 mg via ORAL
  Filled 2024-01-17: qty 2

## 2024-01-17 NOTE — Discharge Summary (Signed)
 Regina Chambers is a 33 y.o. female. She is at Unknown gestation. No LMP recorded. Estimated Date of Delivery: None noted.  Prenatal care site: [facility]   Current pregnancy complicated by: ***  Chief complaint: - headache that starts in her neck and radiates into the side of her head, not relieved with Ibuprofen   - swelling in both feet  S: Resting comfortably. no CTX, no VB.no LOF,   ***Denies: HA, visual changes, SOB, or RUQ/epigastric pain  Maternal Medical History:   Past Medical History:  Diagnosis Date   Anxiety    Blood type, Rh positive    Cervical insufficiency in pregnancy, antepartum    prior pregnancy   Genital herpes    GERD (gastroesophageal reflux disease)    Obesity    Polycystic disease, ovaries     Past Surgical History:  Procedure Laterality Date   CERVICAL CERCLAGE  2016   CESAREAN SECTION N/A 09/02/2014   Procedure: CESAREAN SECTION;  Surgeon: Glory High, MD;  Location: [facility] ORS;  Service: Obstetrics;  Laterality: N/A;   CESAREAN SECTION N/A 01/10/2024   Procedure: CESAREAN DELIVERY;  Surgeon: Leonce Garnette BIRCH, MD;  Location: [facility] ORS;  Service: Obstetrics;  Laterality: N/A;  REPEAT   CHOLECYSTECTOMY N/A 05/02/2015   Procedure: LAPAROSCOPIC CHOLECYSTECTOMY WITH INTRAOPERATIVE CHOLANGIOGRAM;  Surgeon: Larinda Unknown Sharps, MD;  Location: [facility] ORS;  Service: General;  Laterality: N/A;   INTRAUTERINE DEVICE (IUD) INSERTION  10/29/2014   Liletta   LAPAROSCOPIC GASTRIC SLEEVE RESECTION N/A 04/25/2018   Procedure: LAPAROSCOPIC GASTRIC SLEEVE RESECTION, UPPER ENDO, ERAS,  HIATAL HERNIA REPAIR;  Surgeon: Tanda Locus, MD;  Location: [facility] ORS;  Service: General;  Laterality: N/A;   UPPER GI ENDOSCOPY  04/25/2018   Procedure: UPPER GI ENDOSCOPY;  Surgeon: Tanda Locus, MD;  Location: [facility] ORS;  Service: General;;    No Known Allergies  Prior to Admission medications   Medication Sig Start Date End Date Taking? Authorizing Provider   ibuprofen  (ADVIL ) 800 MG tablet Take 1 tablet (800 mg total) by mouth every 8 (eight) hours as needed. 01/12/24  Yes Schermerhorn, Debby PARAS, MD  ondansetron  (ZOFRAN ) 4 MG tablet Take 1 tablet (4 mg total) by mouth daily as needed for nausea or vomiting. 01/12/24 01/11/25 Yes Schermerhorn, Debby PARAS, MD  oxyCODONE -acetaminophen  (PERCOCET) 5-325 MG tablet Take 1 tablet by mouth every 4 (four) hours as needed for severe pain (pain score 7-10). 01/12/24 01/11/25 Yes Schermerhorn, Debby PARAS, MD  oxyCODONE -acetaminophen  (PERCOCET/ROXICET) 5-325 MG tablet Take 1 tablet by mouth every 4 (four) hours as needed (pain score 4-7/10). 01/12/24  Yes Schermerhorn, Debby PARAS, MD  Prenatal Vit-Fe Fumarate-FA (PRENATAL MULTIVITAMIN) TABS tablet Take 1 tablet by mouth daily at 12 noon.   Yes [provider]      Social History: She  reports that she has never smoked. She has never used smokeless tobacco. She reports that she does not currently use alcohol. She reports that she does not use drugs.  Family History: family history includes Diabetes in her paternal grandfather; Heart disease in her father; Hypertension in her father and paternal grandfather; Kidney disease in her father. *** no history of gyn cancers  Review of Systems: A full review of systems was performed and negative except as noted in the HPI.     O:  BP 131/74   Pulse 95   Temp 98.7 F (37.1 C) (Oral)   Resp 20   SpO2 100%  Results for orders placed or performed during the hospital  encounter of 01/17/24 (from the past 48 hours)  CBC   Collection Time: 01/17/24  3:33 PM  Result Value Ref Range   WBC 9.8 4.0 - 10.5 K/uL   RBC 2.87 (L) 3.87 - 5.11 MIL/uL   Hemoglobin 8.4 (L) 12.0 - 15.0 g/dL   HCT 75.1 (L) 63.9 - 53.9 %   MCV 86.4 80.0 - 100.0 fL   MCH 29.3 26.0 - 34.0 pg   MCHC 33.9 30.0 - 36.0 g/dL   RDW 84.8 88.4 - 84.4 %   Platelets 510 (H) 150 - 400 K/uL   nRBC 0.0 0.0 - 0.2 %     Constitutional: NAD, AAOx3  HE/ENT:  extraocular movements grossly intact, moist mucous membranes CV: RRR PULM: nl respiratory effort, CTABL     Abd: gravid, non-tender, non-distended, soft      Ext: Non-tender, Nonedematous   Psych: mood appropriate, speech normal Pelvic: ***deferred/***SSE done  Pelvic exam: {pelvic exam:315900::normal external genitalia, vulva, vagina, cervix, uterus and adnexa}.    A/P: 33 y.o. Unknown here for antenatal surveillance for ***  Principle Diagnosis:  ***High risk pregnancy in third trimester  Labor: not present.  Fetal Wellbeing: Reassuring Cat 1 tracing. Reactive NST  D/c home stable, precautions reviewed, follow-up as scheduled.    Edsel Charlies Blush, CNM @TODAY @ 4:51 PM

## 2024-01-17 NOTE — OB Triage Note (Signed)
 7 day post C/S  patient presents concerned about BPs taken at home that were high per patient.158/108, 145/90 with wrist cuff. States headache started 2 days ago that starts at neck on on both sides and radiates to R side of head. Rates the H/A at 5 on the pain scale. Also concerned about swelling in ankles and feet. Serial BPs started. SABRA

## 2024-01-17 NOTE — OB Triage Note (Signed)
 Pt is being discharged per provider order. Discharge instructions as well as follow up instructions provided and reviewed with the patient. Pt verbalizes understanding. Pt is being discharged home ambulatory and in stable condition.

## 2024-01-18 ENCOUNTER — Telehealth: Payer: Self-pay

## 2024-01-18 NOTE — Transitions of Care (Post Inpatient/ED Visit) (Unsigned)
° °  01/18/2024  Name: Regina Chambers MRN: [id number] DOB: 1990-06-17  Today's TOC FU Call Status: Today's TOC FU Call Status:: Unsuccessful Call (1st Attempt) Unsuccessful Call (1st Attempt) Date: 01/18/24  Attempted to reach the patient regarding the most recent Inpatient/ED visit.  Follow Up Plan: Additional outreach attempts will be made to reach the patient to complete the Transitions of Care (Post Inpatient/ED visit) call.   Signature Julian Lemmings, LPN [facility] Nurse Health Advisor Direct Dial [phone]

## 2024-01-19 NOTE — Transitions of Care (Post Inpatient/ED Visit) (Unsigned)
° °  01/19/2024  Name: Regina Chambers MRN: [id number] DOB: 12-25-1990  Today's TOC FU Call Status: Today's TOC FU Call Status:: Unsuccessful Call (2nd Attempt) Unsuccessful Call (1st Attempt) Date: 01/18/24 Unsuccessful Call (2nd Attempt) Date: 01/19/24  Attempted to reach the patient regarding the most recent Inpatient/ED visit.  Follow Up Plan: Additional outreach attempts will be made to reach the patient to complete the Transitions of Care (Post Inpatient/ED visit) call.   Signature Julian Lemmings, LPN [facility] Nurse Health Advisor Direct Dial [phone]

## 2024-01-20 NOTE — Transitions of Care (Post Inpatient/ED Visit) (Signed)
° °  01/20/2024  Name: Regina Chambers MRN: [id number] DOB: 1991-01-11  Today's TOC FU Call Status: Today's TOC FU Call Status:: Unsuccessful Call (3rd Attempt) Unsuccessful Call (1st Attempt) Date: 01/18/24 Unsuccessful Call (2nd Attempt) Date: 01/19/24 Unsuccessful Call (3rd Attempt) Date: 01/20/24  Attempted to reach the patient regarding the most recent Inpatient/ED visit.  Follow Up Plan: No further outreach attempts will be made at this time. We have been unable to contact the patient.  Signature Julian Lemmings, LPN [facility] Nurse Health Advisor Direct Dial [phone]

## 2024-02-10 ENCOUNTER — Ambulatory Visit (LOCAL_COMMUNITY_HEALTH_CENTER)

## 2024-02-10 DIAGNOSIS — Z111 Encounter for screening for respiratory tuberculosis: Secondary | ICD-10-CM

## 2024-02-13 ENCOUNTER — Ambulatory Visit

## 2024-02-13 DIAGNOSIS — Z111 Encounter for screening for respiratory tuberculosis: Secondary | ICD-10-CM

## 2024-02-13 LAB — TB SKIN TEST
Induration: 6 mm
TB Skin Test: NEGATIVE

## 2024-02-21 ENCOUNTER — Ambulatory Visit: Admitting: Family Medicine

## 2024-02-24 ENCOUNTER — Telehealth: Admitting: Nurse Practitioner

## 2024-02-24 DIAGNOSIS — R7303 Prediabetes: Secondary | ICD-10-CM | POA: Diagnosis not present

## 2024-02-24 DIAGNOSIS — E7800 Pure hypercholesterolemia, unspecified: Secondary | ICD-10-CM | POA: Diagnosis not present

## 2024-02-24 MED ORDER — ZEPBOUND 2.5 MG/0.5ML ~~LOC~~ SOAJ: 2.5000 mg | SUBCUTANEOUS | 0 refills | Status: DC

## 2024-02-24 NOTE — Progress Notes (Signed)
 " Ph: [phone] Fax: [phone]   Patient ID: Regina Chambers, female    DOB: Jan 25, 1991, 34 y.o.   MRN: [id number]  Virtual visit completed through MyChart, a video enabled telemedicine application. Due to national recommendations of social distancing due to COVID-19, a virtual visit is felt to be most appropriate for this patient at this time. Reviewed limitations, risks, security and privacy concerns of performing a virtual visit and the availability of in person appointments. I also reviewed that there may be a patient responsible charge related to this service. The patient agreed to proceed.   Patient location: home Provider location: [place] at [facility], office Persons participating in this virtual visit: patient, provider   If any vitals were documented, they were collected by patient at home unless specified below.    Ht 5' 2 (1.575 m)   BMI 41.53 kg/m    CC: Medical weight management/morbid obesity Subjective:   HPI: Regina Chambers is a 34 y.o. female presenting on 02/24/2024 for Medication Management (GLP-1 . Pt states insurance approves zepound and ozempic. )  Patient is approximately 6 weeks postpartum after cesarean section.  Recently cleared to do light activity such as walking which has been working on.  Patient is more of a grazer versus a milliliter when it comes to diet.  She does snack often throughout the day.  Patient will drink coffee on occasion and majority of water.  Patient is interested in a GLP-1 receptor agonist.  No personal or family history of medullary thyroid cancer or multiple endocrine neoplasia syndrome type II. 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    Relevant past medical, surgical, family and social history reviewed and updated as indicated. Interim medical history since our last visit reviewed. Allergies and medications reviewed and updated. Outpatient Medications Prior to Visit  Medication Sig  Dispense Refill   buPROPion (WELLBUTRIN XL) 150 MG 24 hr tablet Take 150 mg by mouth.     Prenatal Vit-Fe Fumarate-FA (PRENATAL MULTIVITAMIN) TABS tablet Take 1 tablet by mouth daily at 12 noon.     ibuprofen  (ADVIL ) 800 MG tablet Take 1 tablet (800 mg total) by mouth every 8 (eight) hours as needed. (Patient not taking: Reported on 02/10/2024) 30 tablet 0   ondansetron  (ZOFRAN ) 4 MG tablet Take 1 tablet (4 mg total) by mouth daily as needed for nausea or vomiting. (Patient not taking: Reported on 02/10/2024) 30 tablet 1   oxyCODONE -acetaminophen  (PERCOCET) 5-325 MG tablet Take 1 tablet by mouth every 4 (four) hours as needed for severe pain (pain score 7-10). (Patient not taking: Reported on 02/10/2024) 20 tablet 0   oxyCODONE -acetaminophen  (PERCOCET/ROXICET) 5-325 MG tablet Take 1 tablet by mouth every 4 (four) hours as needed (pain score 4-7/10). (Patient not taking: Reported on 02/10/2024) 30 tablet 0   No facility-administered medications prior to visit.     Per HPI unless specifically indicated in ROS section below Review of Systems  Constitutional:  Negative for chills and fever.   Objective:  Ht 5' 2 (1.575 m)   BMI 41.53 kg/m   Wt Readings from Last 3 Encounters:  01/10/24 227 lb 1.2 oz (103 kg)  01/04/24 228 lb (103.4 kg)  11/20/23 225 lb (102.1 kg)       Physical exam: Gen: alert, NAD, not ill appearing Pulm: speaks in complete sentences without increased work of breathing Psych: normal mood, normal thought content      Results for orders placed or  performed in visit on 02/10/24  TB Skin Test   Collection Time: 02/13/24  9:45 AM  Result Value Ref Range   TB Skin Test Negative    Induration 6 mm   Assessment & Plan:   Morbid obesity (HCC) Assessment & Plan: History of the same in the setting of morbid obesity.  We will trial Zepbound  2.5 mg q. weekly with anticipation to titrate up monthly Patient also had a gastric sleeve resection in the past.  She is currently not  fully cleared from cesarean section but is doing walking for exercise which she has been approved to do. Continue working healthy lifestyle modifications inclusive of exercise as tolerated and approved through careers adviser and dietary restrictions  Orders: -     Zepbound ; Inject 2.5 mg into the skin once a week.  Dispense: 2 mL; Refill: 0  Elevated LDL cholesterol level Assessment & Plan: History of the same in the setting of morbid obesity.  We will trial Zepbound  2.5 mg q. weekly with anticipation to titrate up monthly  Orders: -     Zepbound ; Inject 2.5 mg into the skin once a week.  Dispense: 2 mL; Refill: 0  Prediabetes Assessment & Plan: History of the same in the setting of morbid obesity.  We will trial Zepbound  2.5 mg q. weekly with anticipation to titrate up monthly  Orders: -     Zepbound ; Inject 2.5 mg into the skin once a week.  Dispense: 2 mL; Refill: 0  Patient is not breast-feeding  I discussed the assessment and treatment plan with the patient. The patient was provided an opportunity to ask questions and all were answered. The patient agreed with the plan and demonstrated an understanding of the instructions. The patient was advised to call back or seek an in-person evaluation if the symptoms worsen or if the condition fails to improve as anticipated.  Follow up plan: Return in about 3 months (around 05/24/2024) for zepbound /weight recheck .  Adina Crandall, NP   "

## 2024-02-24 NOTE — Assessment & Plan Note (Signed)
 History of the same in the setting of morbid obesity.  We will trial Zepbound  2.5 mg q. weekly with anticipation to titrate up monthly

## 2024-02-24 NOTE — Assessment & Plan Note (Signed)
 History of the same in the setting of morbid obesity.  We will trial Zepbound  2.5 mg q. weekly with anticipation to titrate up monthly Patient also had a gastric sleeve resection in the past.  She is currently not fully cleared from cesarean section but is doing walking for exercise which she has been approved to do. Continue working healthy lifestyle modifications inclusive of exercise as tolerated and approved through careers adviser and dietary restrictions

## 2024-02-28 ENCOUNTER — Encounter: Payer: Self-pay | Admitting: Nurse Practitioner

## 2024-03-07 NOTE — Telephone Encounter (Signed)
 Patient called in stating [employer] is waiting for additional information in regards to PA for zepbound  prescription. Patient called in with [employer] on the line today and they gave an access code of [id number] through covermymeds to complete PA .

## 2024-03-12 ENCOUNTER — Telehealth: Payer: Self-pay | Admitting: Pharmacy Technician

## 2024-03-12 ENCOUNTER — Other Ambulatory Visit (HOSPITAL_COMMUNITY): Payer: Self-pay

## 2024-03-12 ENCOUNTER — Telehealth: Payer: Self-pay | Admitting: Nurse Practitioner

## 2024-03-12 NOTE — Telephone Encounter (Signed)
 Ok for [employer]

## 2024-03-12 NOTE — Telephone Encounter (Signed)
 Pharmacy Patient Advocate Encounter   Received notification from [facility] KEY that prior authorization for Zepbound  2.5 mg is required/requested.   Insurance verification completed.   The patient is insured through [employer].   Per test claim:  Wegovy  is preferred by the insurance.  If suggested medication is appropriate, Please send in a new RX and discontinue this one. If not, please advise as to why it's not appropriate so that we may request a Prior Authorization. Please note, some preferred medications may still require a PA.  If the suggested medications have not been trialed and there are no contraindications to their use, the PA will not be submitted, as it will not be approved. Archived Key: [id number]

## 2024-03-12 NOTE — Telephone Encounter (Signed)
 Copied from CRM [id number]. Topic: Appointments - Transfer of Care >> Mar 12, 2024 10:52 AM Alfonso ORN wrote: Pt is requesting to transfer FROM: cable, james m Pt is requesting to transfer TO: [facility] Reason for requested transfer: pt unsatisfied with current care

## 2024-03-14 ENCOUNTER — Other Ambulatory Visit (HOSPITAL_COMMUNITY): Payer: Self-pay

## 2024-03-14 MED ORDER — WEGOVY 0.25 MG/0.5ML ~~LOC~~ SOAJ: 0.2500 mg | SUBCUTANEOUS | 0 refills | Status: AC

## 2024-03-14 NOTE — Telephone Encounter (Signed)
 I have spoke to patient documented in another call. Auth was sent verified on cover my meds it was approved. I have reached out to patient she is aware and on the way to pick up now.  No further action needed at this time.

## 2024-03-14 NOTE — Telephone Encounter (Signed)
 Patient called to follow up on this authorization. Very frustrated that she has not received any information. While on the phone with patient I have started auth sent patient updated information on her my chart so she can reach out to insurance to follow up and possibly get approved sooner.

## 2024-03-14 NOTE — Addendum Note (Signed)
 Addended by: WENDEE LYNWOOD HERO on: 03/14/2024 07:33 AM   Modules accepted: Orders

## 2024-03-14 NOTE — Telephone Encounter (Signed)
Wegovy rx sent in

## 2024-03-14 NOTE — Telephone Encounter (Signed)
 Copied from CRM [id number]. Topic: Clinical - Medication Prior Auth >> Mar 14, 2024 11:21 AM Harlene ORN wrote: Reason for CRM: Patient has been trying since January 16 to get her PA from her PCP for her medication and she has still not received any news about it. Attempted to transfer her to the Practice to discuss per her request, unfortunately the call dropped before I could transfer. Please speak to the patient about this matter directly.

## 2024-03-15 NOTE — Telephone Encounter (Signed)
 Left voicemail for patient to call the office back regarding approval letter for wegovy .  Sending letter to scan.

## 2024-05-10 ENCOUNTER — Encounter
